# Patient Record
Sex: Male | Born: 1940 | Race: White | Hispanic: No | Smoking: Former smoker
Health system: Southern US, Community
[De-identification: ages and names within clinical notes are randomized; demographics above are authoritative.]

## PROBLEM LIST (undated history)

## (undated) DIAGNOSIS — R1314 Dysphagia, pharyngoesophageal phase: Principal | ICD-10-CM

## (undated) DIAGNOSIS — Z973 Presence of spectacles and contact lenses: Secondary | ICD-10-CM

## (undated) DIAGNOSIS — C61 Malignant neoplasm of prostate: Secondary | ICD-10-CM

## (undated) DIAGNOSIS — N433 Hydrocele, unspecified: Secondary | ICD-10-CM

## (undated) DIAGNOSIS — K219 Gastro-esophageal reflux disease without esophagitis: Secondary | ICD-10-CM

## (undated) DIAGNOSIS — E785 Hyperlipidemia, unspecified: Secondary | ICD-10-CM

## (undated) DIAGNOSIS — Z8546 Personal history of malignant neoplasm of prostate: Secondary | ICD-10-CM

## (undated) DIAGNOSIS — N529 Male erectile dysfunction, unspecified: Secondary | ICD-10-CM

## (undated) DIAGNOSIS — G20A1 Parkinson's disease without dyskinesia, without mention of fluctuations: Secondary | ICD-10-CM

## (undated) HISTORY — DX: Parkinson's disease without dyskinesia, without mention of fluctuations: G20.A1

## (undated) HISTORY — PX: ROBOT ASSISTED LAPAROSCOPIC RADICAL PROSTATECTOMY: SHX5141

## (undated) HISTORY — DX: Hyperlipidemia, unspecified: E78.5

## (undated) HISTORY — PX: TONSILLECTOMY: SUR1361

## (undated) HISTORY — DX: Malignant neoplasm of prostate: C61

## (undated) HISTORY — DX: Dysphagia, pharyngoesophageal phase: R13.14

## (undated) HISTORY — DX: Gastro-esophageal reflux disease without esophagitis: K21.9

---

## 1969-08-06 HISTORY — PX: PILONIDAL CYST EXCISION: SHX744

## 1992-12-06 HISTORY — PX: HYDROCELE EXCISION: SHX482

## 2004-11-05 ENCOUNTER — Encounter (INDEPENDENT_AMBULATORY_CARE_PROVIDER_SITE_OTHER): Payer: Self-pay | Admitting: Specialist

## 2004-11-05 ENCOUNTER — Ambulatory Visit (HOSPITAL_COMMUNITY): Admission: RE | Admit: 2004-11-05 | Discharge: 2004-11-05 | Payer: Self-pay | Admitting: Gastroenterology

## 2005-11-11 ENCOUNTER — Ambulatory Visit (HOSPITAL_BASED_OUTPATIENT_CLINIC_OR_DEPARTMENT_OTHER): Admission: RE | Admit: 2005-11-11 | Discharge: 2005-11-11 | Payer: Self-pay | Admitting: Orthopedic Surgery

## 2005-11-11 ENCOUNTER — Ambulatory Visit (HOSPITAL_COMMUNITY): Admission: RE | Admit: 2005-11-11 | Discharge: 2005-11-11 | Payer: Self-pay | Admitting: Orthopedic Surgery

## 2005-11-11 HISTORY — PX: TENOTOMY ACHILLES TENDON: SUR1337

## 2007-10-17 ENCOUNTER — Ambulatory Visit: Admission: RE | Admit: 2007-10-17 | Discharge: 2007-11-14 | Payer: Self-pay | Admitting: Radiation Oncology

## 2008-01-01 ENCOUNTER — Encounter (INDEPENDENT_AMBULATORY_CARE_PROVIDER_SITE_OTHER): Payer: Self-pay | Admitting: Urology

## 2008-01-01 ENCOUNTER — Inpatient Hospital Stay (HOSPITAL_COMMUNITY): Admission: RE | Admit: 2008-01-01 | Discharge: 2008-01-02 | Payer: Self-pay | Admitting: Urology

## 2009-03-10 IMAGING — CR DG CHEST 2V
2 series · 2 of 2 positions shown · non-contrast
Comparison: No prior studies are available for comparison.

CLINICAL DATA: Preoperative evaluation. Prostate cancer. Quit smoking 30 years ago.
 CHEST - 2 VIEW:

[view not recorded (1 of 2)]
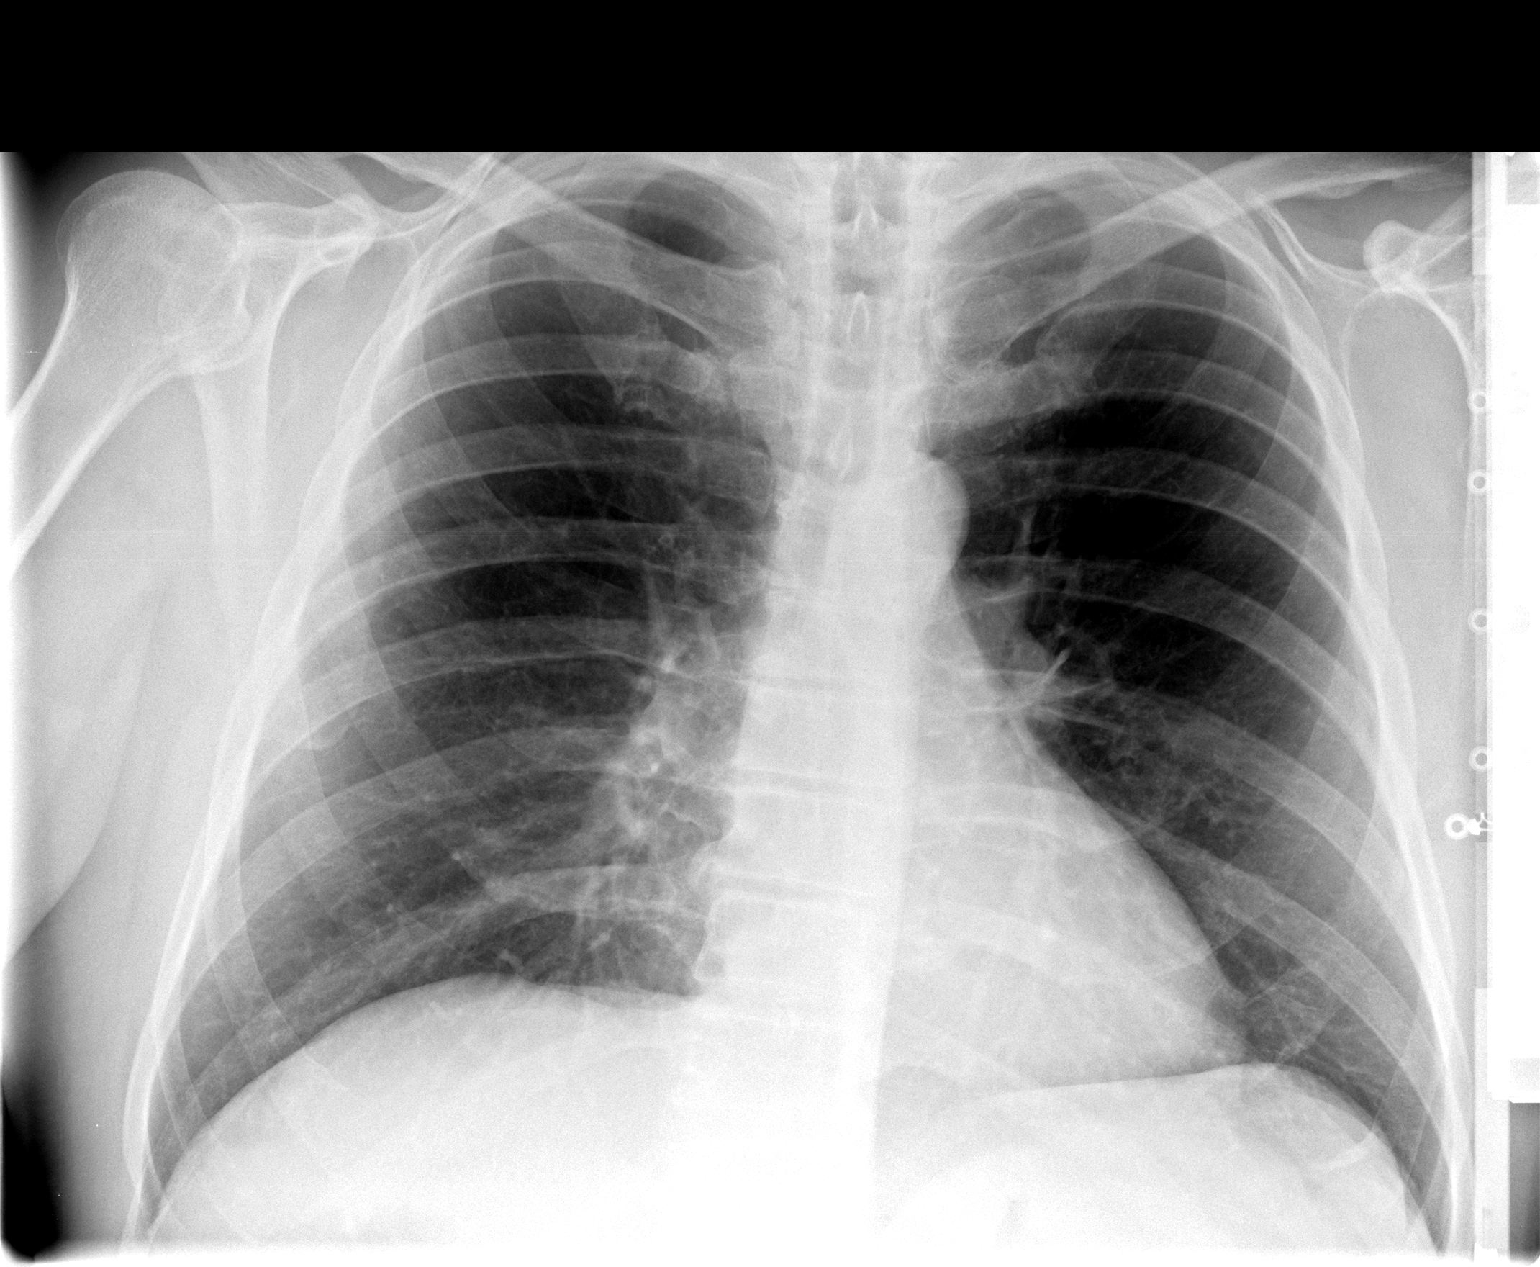

[view not recorded (2 of 2)]
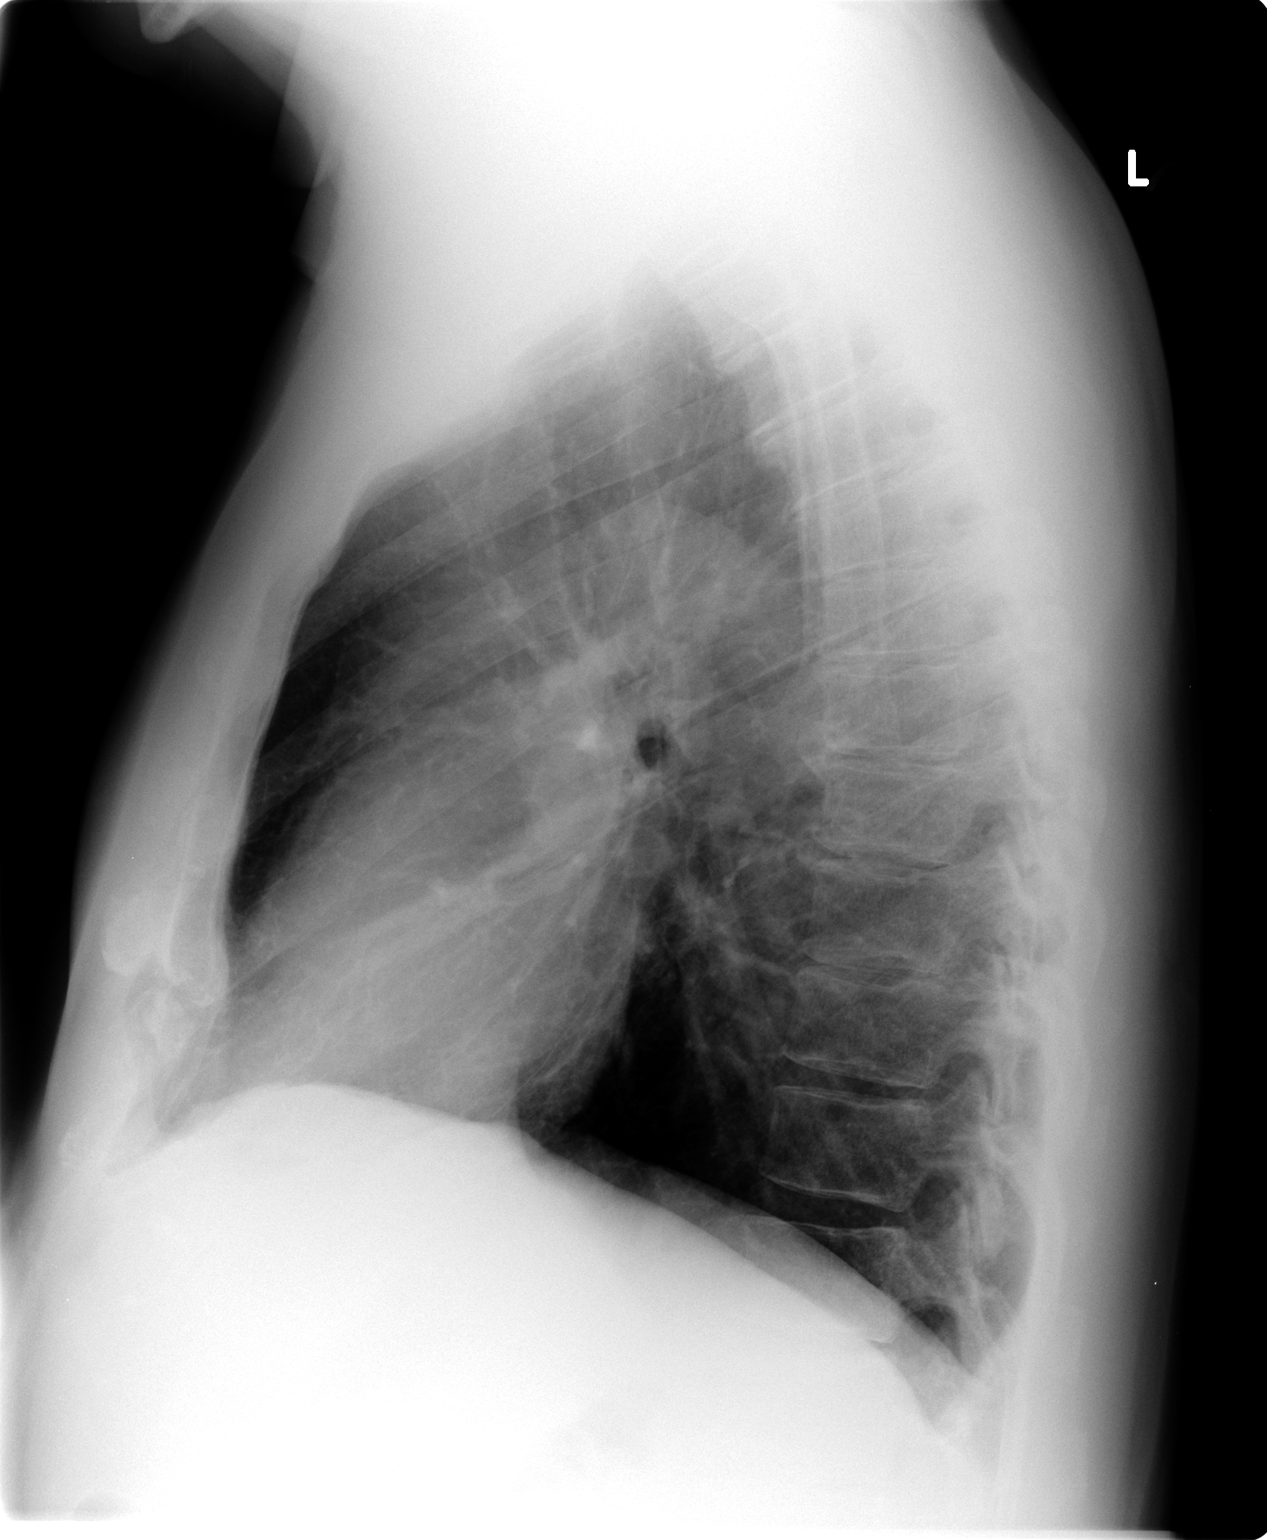

[2 of 2 positions shown; findings below may reference images not displayed]

FINDINGS: The heart and mediastinal contours are within normal limits. The lung fields appear clear with no evidence for focal infiltrate or congestive failure. The bony structures demonstrate some degenerative changes of the mid thoracic spine with anterior osteophytosis at several levels and are otherwise intact.
IMPRESSION: No acute cardiopulmonary disease.

## 2011-04-20 NOTE — Op Note (Signed)
NAME:  Johnny [name], Johnny [name] NO.:  [id number]   MEDICAL RECORD NO.:  [id number]          PATIENT TYPE:  INP   LOCATION:  0003                         FACILITY:  [facility]   PHYSICIAN:  Heloise Purpura, MD      DATE OF BIRTH:  [birth year]   DATE OF PROCEDURE:  01/01/2008  DATE OF DISCHARGE:                               OPERATIVE REPORT   PREOPERATIVE DIAGNOSIS:  Clinically localized adenocarcinoma of the  prostate.   POSTOPERATIVE DIAGNOSIS:  Clinically localized adenocarcinoma of the  prostate.   PROCEDURE:  Robotic assisted laparoscopic radical prostatectomy  (bilateral nerve sparing).   SURGEON:  Dr. Heloise Purpura.   ASSISTANT:  Dr. Georgeanna Lea.   ANESTHESIA:  General.   COMPLICATIONS:  None.   ESTIMATED BLOOD LOSS:  100 mL.   INTRAVENOUS FLUIDS:  1600 mL of lactated Ringer's.   SPECIMENS:  Prostate and seminal vesicles.   DISPOSITION OF SPECIMENS:  To pathology.   DRAINS:  1. 20-French coude' catheter.  2. #19 Blake pelvic drain.   INDICATIONS:  Mr. Meegan is a 70 year old gentleman with clinically  localized adenocarcinoma of prostate.  After a discussion regarding  management options for treatment, he elected to proceed with surgical  therapy and the above procedure.  Potential risks/benefits,  complications, and alternative options were discussed with the patient  in detail and informed consent was obtained.   DESCRIPTION OF PROCEDURE:  The patient was taken to the operating room  and a general anesthetic was administered.  He was given preoperative  antibiotics, placed in the dorsal lithotomy position, and prepped and  draped in the usual sterile fashion.  Next a preoperative time-out was  performed.  A Foley catheter was inserted into the bladder and a site  was selected just to the left of the umbilicus for placement of the  camera port.  This was placed using a standard open Hasson technique.  This allowed entry into the peritoneal cavity  and under direct vision.  A 12 mm port was then placed and a pneumoperitoneum was established.  The 0 degrees lens was used to inspect the abdomen and there was no  evidence of any intra-abdominal injuries or other abnormalities.  Attention then turned to placement of the remaining ports.  Bilateral 8  mm robotic ports were placed lateral to and just inferior to the camera  port site.  An additional 8 mm robotic port was placed in the far left  lateral abdominal wall.  A 5 mm port was placed between the camera port  and the right robotic port and an additional 12 mm port was placed in  the far right lateral abdominal wall for laparoscopic assistance.  All  ports were placed under direct vision and without difficulty.  The  surgical cart was then docked.  With the aid of the cautery scissors,  the bladder was reflected posteriorly allowing entry into the space of  Retzius and identification of the endopelvic fascia and prostate.  The  endopelvic fascia was incised from the apex back to the base of the  prostate bilaterally and the underlying levator muscle fibers  were swept  laterally off the prostate thereby isolating the dorsal venous complex.  The dorsal venous complex was then stapled and divided with a 45 mm Flex  ETS stapler.  The bladder neck was identified with the help of Foley  catheter manipulation and was divided anteriorly exposing the Foley  catheter balloon.  The patient was noted to have lateral lobe  hypertrophy of the prostate and necessitated a wide bladder neck  resection.  The Foley catheter balloon was deflated and the catheter was  brought into the operative field and used to retract the prostate  anteriorly.  This exposed the posterior bladder neck which was divided  and dissection continued between the prostate and bladder until the vasa  deferentia and seminal vesicles were identified.  The vasa deferentia  were isolated, divided and lifted anteriorly.  The seminal  vesicles were  dissected down to their tips with care to control the seminal vesicle  arterial blood supply with Hem-o-lok clips.  The seminal vesicles were  then lifted anteriorly in the space between Denonvilliers fascia and the  anterior rectum was bluntly developed thereby isolating the vascular  pedicles of the prostate.  The lateral prostatic fascia was incised  allowing the neurovascular bundles to be released laterally and  posteriorly.  The vascular pedicles of the prostate were then ligated  with Hem-o-lok clips above the level of the neurovascular bundles and  sharp cold scissor dissection was used to divide the vascular pedicles.  The neurovascular bundles were swept off the apex of the prostate and  urethra.  The ureter was then sharply divided allowing the prostate  specimen to be disarticulated.  The pelvis was copiously irrigated and  hemostasis was ensured.  There was no evidence of a rectal injury.  Attention then turned the bladder neck.  Due to the fact that a wide  bladder neck dissection was required, it was decided to reconstruct the  bladder neck.  Therefore figure-of-eight 3-0 Vicryl sutures were placed  laterally on either side of the bladder neck for reconstructive  purposes.  A 2-0 Vicryl suture was then used to reapproximate  Denonvilliers fascia, the posterior bladder, and the posterior urethra  to reapproximate these structures.  A double-armed 3-0 Monocryl suture  was used to perform a 360 degrees running tension-free anastomosis  between the bladder neck and urethra.  A new 20-French coude' catheter  was inserted into the bladder and irrigated.  There were no blood clots  within the bladder and anastomosis appeared to be watertight.  A #19  Blake drain was brought through the left robotic port and appropriately  positioned in the pelvis.  It was secured to the skin with a nylon  suture.  The surgical cart was then undocked.  The right lateral 12 mm  port  site was closed with a 0-0 Vicryl fascial suture placed with the  aid of the suture passer device.  All remaining ports were removed under  direct vision.  The prostate specimen was then removed intact within the  Endopouch retrieval bag.  This fascial opening was closed with a running  0-0 Vicryl suture.  All port sites were injected with quarter percent  Marcaine and reapproximated at the skin level with staples.  Sterile  dressings were applied.  The patient appeared to tolerate the procedure  well without complications.  He was able to be extubated and transferred  to the recovery unit in satisfactory condition.      Heloise Purpura, MD  Electronically Signed     LB/MEDQ  D:  01/01/2008  T:  01/01/2008  Job:  [id number]

## 2011-04-20 NOTE — H&P (Signed)
NAME:  DAMEION, BRILES NO.:  [id number]   MEDICAL RECORD NO.:  [id number]          PATIENT TYPE:  INP   LOCATION:  0003                         FACILITY:  [facility]   PHYSICIAN:  Heloise Purpura, MD      DATE OF BIRTH:  [birth year]   DATE OF ADMISSION:  01/01/2008  DATE OF DISCHARGE:                              HISTORY & PHYSICAL   CHIEF COMPLAINTS:  Prostate cancer.   HISTORY OF PRESENT ILLNESS:  Mr. Delbene is a 70 year old gentleman  with clinical stage T1C prostate cancer with a PSA of 4.58 and Gleason  score of 3+3=6.  After a discussion regarding management options for  clinically localized prostate cancer, he elected to proceed with  surgical therapy and a robotic prostatectomy.   PAST MEDICAL HISTORY:  1. Gastroesophageal reflux disease.  2. Dyslipidemia.   PAST SURGICAL HISTORY:  1. Excision of pilonidal cyst  2. Repair of ruptured Achilles tendon.  3. Hydrocele repair.   MEDICATIONS:  1. Claritin.  2. Lipitor.  3. Singulair.  4. Zantac.   ALLERGIES:  NO KNOWN DRUG ALLERGIES.   FAMILY HISTORY:  No history of prostate cancer.   SOCIAL HISTORY:  He drinks two glass of alcohol per day and did smoke  one pack of cigarettes for 15 years but quit over 25 years ago.  He is  currently retired and is married.   REVIEW OF SYSTEMS:  A complete review of systems was performed.  All  systems are reviewed and are negative.   PHYSICAL EXAMINATION:  CONSTITUTIONAL:  Well-nourished, well-developed  age-appropriate male in no acute distress.  CARDIOVASCULAR:  Regular rate and rhythm without obvious murmurs.  LUNGS:  Clear bilaterally.  ABDOMEN:  Soft and nontender without abdominal masses.  GU: No prostate nodularity or induration.  EXTREMITIES:  No edema.   IMPRESSION:  Clinically localized adenocarcinoma of the prostate.   PLAN:  Mr. Wiedeman will undergo a robotic assisted laparoscopic radical  prostatectomy and then be admitted to the hospital for  routine  postoperative care.      Heloise Purpura, MD  Electronically Signed     LB/MEDQ  D:  01/01/2008  T:  01/01/2008  Job:  [id number]

## 2011-04-20 NOTE — Discharge Summary (Signed)
NAME:  Johnny [name], Johnny [name]             ACCOUNT NO.:  [id number]   MEDICAL RECORD NO.:  [id number]          PATIENT TYPE:  INP   LOCATION:  1417                         [facility]ACILITY:  [facility]   PHYSICIAN:  Heloise Purpura, MD      DATE OF BIRTH:  December 02, 1941   DATE OF ADMISSION:  01/01/2008  DATE OF DISCHARGE:  01/02/2008                               DISCHARGE SUMMARY   ADMISSION DIAGNOSIS:  Prostate cancer.   DISCHARGE DIAGNOSIS:  Prostate cancer.   HISTORY AND PHYSICAL:  For full details please see admission history and  physical.  Briefly, [name] is a 70 year old gentleman with  clinically localized adenocarcinoma of the prostate.  After a discussion  regarding management options for treatment, he elected to proceed with  surgical therapy and a robotic prostatectomy.   HOSPITAL COURSE:  On January 01, 2008, the patient was taken to the  operating room and underwent a robotic assisted laparoscopic radical  prostatectomy.  He tolerated this procedure well without complications.  Postoperatively, he was able to be transferred to a regular hospital  room following recovery from anesthesia.  He was able to begin  ambulating the night of surgery and remained hemodynamically stable.  On  the morning of postoperative day #1, his hematocrit was rechecked and  remained stable at 33.2.  He maintained excellent urine output with  minimal output from his pelvic drain.  His pelvic drain was therefore  able to be removed.  He was begun on a clear liquid diet which he  tolerated without difficulty and he was subsequently transitioned to  oral pain medication.  By the afternoon of postoperative day #1, he was  tolerating a regular diet and was able to be discharged home in  excellent condition.   DISPOSITION:  Home.   DISCHARGE MEDICATIONS:  He was instructed to resume his regular home  medications excepting any aspirin, nonsteroidal anti-inflammatory drugs,  or herbal supplements.  He was given a  prescription to take Vicodin as  needed for pain and to begin Cipro 1 day prior to his return visit for  Foley catheter removal.   DISCHARGE INSTRUCTIONS:  He was instructed to be ambulatory but  specifically told to refrain from any heavy lifting, strenuous activity,  or driving.  He was given routine instruction on Foley catheter care.   FOLLOW UP:  [name] will follow-up in 1 week for removal of his  Foley catheter and to discuss his surgical pathology in detail.      Heloise Purpura, MD  Electronically Signed     LB/MEDQ  D:  01/02/2008  T:  01/03/2008  Job:  [id number]

## 2011-04-23 NOTE — Op Note (Signed)
NAME:  Johnny [name], Johnny [name]             ACCOUNT NO.:  [id number]   MEDICAL RECORD NO.:  [id number]          PATIENT TYPE:  AMB   LOCATION:  DSC                          FACILITY:  [facility]   PHYSICIAN:  Nadara Mustard, MD     DATE OF BIRTH:  1941/07/24   DATE OF PROCEDURE:  11/11/2005  DATE OF DISCHARGE:                                 OPERATIVE REPORT   PREOPERATIVE DIAGNOSIS:  Right Achilles tendon rupture.   POSTOPERATIVE DIAGNOSIS:  Right Achilles tendon rupture.   PROCEDURE:  Right Achilles tendon reconstruction.   SURGEON:  Nadara Mustard, M.D.   ANESTHESIA:  Popliteal block.   ESTIMATED BLOOD LOSS:  Minimal.   ANTIBIOTICS:  None.   DRAINS:  None.   COMPLICATIONS:  None.   TOURNIQUET TIME:  None.   DISPOSITION:  To PACU in stable condition.   INDICATION FOR PROCEDURE:  The patient is a 70 year old gentleman who this  past weekend injured his Achilles tendon.  He thought that this would  improve with time.  It did not.  He was seen by Dr. Prince Rome, who evaluated the  patient, diagnosed an Achilles tendon rupture, and now he is seen for  evaluation and consultation, and the patient elected to proceed with  Achilles tendon reconstruction.  The risks and benefits were discussed  including infection, neurovascular injury, nonhealing of the wound and need  for additional surgery.  The patient states he understands and wishes  proceed at this time.   DESCRIPTION OF PROCEDURE:  The patient was brought to OR room 5 after  undergoing a popliteal block.  After adequate levels of anesthesia obtained,  the patient was placed in the lateral position with the right side down and  the right lower extremity was prepped using DuraPrep, draped into a sterile  field.  An Collier Flowers was used to cover all exposed skin.  A posterior medial  incision was made.  This was carried down through the Achilles peritenon.  This was reflected.  The Achilles tendon rupture was identified.  Both the  proximal  and distal ends had retracted.  There was approximately a 4 cm gap.  The Achilles was freed from its adhesions and the edges reapproximated  without any tension on the Achilles.  Using a Krakow stitch, two sutures  were placed proximally and two sutures distally with a Krakow stitch with a  total of four sutures exiting the proximal stump and four sutures exiting  the distal stump.  These sutures were each tied with the foot in plantar  flexion.  The Achilles approximated nicely and with plantar flexion and  dorsiflexion of the foot.  The Achilles tendon moved without any  restrictions.  The wound was cleansed.  The peritenon was closed using a  running 3-0 Monocryl.  The skin incision was closed using a 3-0 Vicryl with  an Allgower suture technique with no sutures crossing the posterior skin.  This closed without any tension on the skin.  The foot was kept in plantar  flexion and the skin was covered with Adaptic orthopedic sponges, sterile  Webril and a Coban  dressing.  The patient was placed in a postoperative  fracture boot with a heel lift to keep the foot plantar flexed.  The patient  was then taken to the PACU in stable condition.  Discharge to home.  Prescription called in for Vicodin for pain.  Follow-up in office in two  weeks.  The patient was given instructions for keeping his foot elevated  above his heart and nonweightbearing.      Nadara Mustard, MD  Electronically Signed     MVD/MEDQ  D:  11/11/2005  T:  11/12/2005  Job:  [id number]

## 2011-04-23 NOTE — Op Note (Signed)
NAME:  DERRAL, COLUCCI             ACCOUNT NO.:  [id number]   MEDICAL RECORD NO.:  [id number]          PATIENT TYPE:  AMB   LOCATION:  ENDO                         FACILITY:  [facility]   PHYSICIAN:  Danise Edge, M.D.   DATE OF BIRTH:  1941/04/17   DATE OF PROCEDURE:  11/05/2004  DATE OF DISCHARGE:                                 OPERATIVE REPORT   PROCEDURE:  Screening colonoscopy with polypectomy.   PROCEDURE INDICATION:  Mr. Slayde Brault is a 70 year old male, born 10/15/1941.  Mr. Bensinger submitted stool Hemoccult cards to Dr. Woodroe Mode  office.  Two of three cards were positive for blood.   ENDOSCOPIST:  Danise Edge, M.D.   PREMEDICATION:  1.  Versed 6 mg.  2.  Demerol 70 mg.   DESCRIPTION OF PROCEDURE:  After obtaining informed consent, Mr. Beeck  was placed in the left lateral decubitus position.  I administered  intravenous Demerol and intravenous Versed to achieve conscious sedation for  the procedure.  The patient's blood pressure, oxygen saturation, and cardiac  rhythm were monitored throughout the procedure and documented in the medical  record.   Anal inspection was normal.  Digital rectal exam revealed a nonnodular  prostate.  The Olympus adjustable pediatric colonoscope was introduced into  the rectum and advanced to the cecum.  Colonic preparation for the exam  today was excellent.   RECTUM:  From the mid rectum, two 2 mm sessile polyps were removed with the  electrocautery snare.  SIGMOID COLON AND DESCENDING COLON:  Normal.  SPLENIC FLEXURE:  Normal.  TRANSVERSE COLON:  Normal.  HEPATIC FLEXURE:  A 3 mm sessile polyp was removed from the hepatic flexure  with th electrocautery snare.  ASCENDING COLON:  Normal.  CECUM AND ILEOCECAL VALVE:  Normal.   ASSESSMENT:  1.  Two small polyps were removed from the rectum.  2.  A small polyp was removed from the hepatic flexure.   RECOMMENDATIONS:  Repeat colonoscopy in approximately 3-5 years if  polyps  return neoplastic pathologically.      MJ/MEDQ  D:  11/05/2004  T:  11/05/2004  Job:  [id number]   cc:   Maryla Morrow. Modesto Charon, M.D.  [address]  [place]  [place] [address]  Fax: [phone]

## 2011-08-27 LAB — BASIC METABOLIC PANEL
BUN: 15
CO2: 29
Chloride: 108
Potassium: 4.8

## 2011-08-27 LAB — CBC
HCT: 38.9 — ABNORMAL LOW
Hemoglobin: 13.6
MCV: 95.4
RDW: 12.6
WBC: 3.5 — ABNORMAL LOW

## 2011-08-27 LAB — TYPE AND SCREEN: Antibody Screen: NEGATIVE

## 2011-08-27 LAB — HEMOGLOBIN AND HEMATOCRIT, BLOOD
HCT: 36.8 — ABNORMAL LOW
Hemoglobin: 11.6 — ABNORMAL LOW

## 2013-03-07 DIAGNOSIS — E559 Vitamin D deficiency, unspecified: Secondary | ICD-10-CM | POA: Insufficient documentation

## 2013-03-07 DIAGNOSIS — C61 Malignant neoplasm of prostate: Secondary | ICD-10-CM | POA: Insufficient documentation

## 2013-03-07 DIAGNOSIS — D126 Benign neoplasm of colon, unspecified: Secondary | ICD-10-CM

## 2013-03-07 HISTORY — DX: Benign neoplasm of colon, unspecified: D12.6

## 2013-03-07 HISTORY — DX: Vitamin D deficiency, unspecified: E55.9

## 2013-08-10 ENCOUNTER — Other Ambulatory Visit: Payer: Self-pay | Admitting: Urology

## 2013-09-10 ENCOUNTER — Encounter (INDEPENDENT_AMBULATORY_CARE_PROVIDER_SITE_OTHER): Payer: Self-pay | Admitting: Surgery

## 2013-09-10 ENCOUNTER — Ambulatory Visit (INDEPENDENT_AMBULATORY_CARE_PROVIDER_SITE_OTHER): Payer: Medicare Other | Admitting: Surgery

## 2013-09-10 VITALS — BP 100/60 | HR 68 | Temp 98.6°F | Resp 14 | Ht 70.0 in | Wt 168.0 lb

## 2013-09-10 DIAGNOSIS — N433 Hydrocele, unspecified: Secondary | ICD-10-CM | POA: Insufficient documentation

## 2013-09-10 DIAGNOSIS — Z860100 Personal history of colon polyps, unspecified: Secondary | ICD-10-CM

## 2013-09-10 DIAGNOSIS — C61 Malignant neoplasm of prostate: Secondary | ICD-10-CM

## 2013-09-10 HISTORY — DX: Personal history of colon polyps, unspecified: Z86.0100

## 2013-09-10 HISTORY — DX: Personal history of colonic polyps: Z86.010

## 2013-09-10 HISTORY — DX: Hydrocele, unspecified: N43.3

## 2013-09-10 NOTE — Progress Notes (Signed)
Subjective:     Patient ID: Johnny [name], male   DOB: 05/23/1941, 72 y.o.   MRN: [id number]  HPI  Johnny [name]  1941/06/16 [id number]  Patient Care Team: Tammy Eartha Inch, MD as PCP - General (Family Medicine) Kathi Ludwig, MD as Consulting Physician (Urology) Ardeth Sportsman, MD as Consulting Physician (General Surgery)  This patient is a 72 y.o.male who presents today for surgical evaluation at the request of Dr. Patsi Sears.   Reason for visit: Large right scrotal hydrocele.  Possible concurrent right inguinal hernia  Pleasant male.  Had robotic prostatectomy done five years ago.  Recovered from that well.  Has a history of a left hydrocele resected in the distant past.  Has gradually developed a hydrocele in the right scrotum.  It has gotten more large and bothersome.  He mentioned this to his Urologist.  Hydrocele resection was recommended.  Dr. Patsi Sears was concerned about the possibility of a concurrent hernia.  Surgical consultation requested.  Patient rather active.  Unfortunately, his wife passed away in the summer, having to be an intense caregiver for the past year.  No problems with incontinence to urine or stool.  No history of MRSA.  Has a bowel movement about every day.  No complications with prior surgeries that he can recall.  He can walk at least 20 minutes without difficulty.  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  Patient Active Problem List   Diagnosis Date Noted  . Hydrocele, right 09/10/2013  . Personal history of colonic polyps 09/10/2013  . Prostate cancer     Past Medical History  Diagnosis Date  . Prostate cancer 2009    prostatectomy 2009   . GERD (gastroesophageal reflux disease)   . Hyperlipidemia     Past Surgical History  Procedure Laterality Date  . Robot assisted laparoscopic radical prostatectomy  2009    Dr Laverle Patter  . Tenotomy achilles tendon  2006    History   Social History  . Marital Status: Married     Spouse Name: N/A    Number of Children: N/A  . Years of Education: N/A   Occupational History  . Not on file.   Social History Main Topics  . Smoking status: Former Smoker -- 0.25 packs/day    Quit date: 12/06/1978  . Smokeless tobacco: Never Used  . Alcohol Use: 1.2 oz/week    2 Glasses of wine per week     Comment: daily  . Drug Use: No  . Sexual Activity: Not on file   Other Topics Concern  . Not on file   Social History Narrative  . No narrative on file    Family History  Problem Relation Age of Onset  . Cancer Son     bladder    Current Outpatient Prescriptions  Medication Sig Dispense Refill  . aspirin 81 MG tablet Take 81 mg by mouth daily.      . cetirizine (ZYRTEC) 10 MG tablet Take 10 mg by mouth daily.      . fish oil-omega-3 fatty acids 1000 MG capsule Take 2 g by mouth daily.      Marland Kitchen atorvastatin (LIPITOR) 20 MG tablet       . DEXILANT 60 MG capsule        No current facility-administered medications for this visit.     Not on File  BP 100/60  Pulse 68  Temp(Src) 98.6 F (37 C) (Temporal)  Resp 14  Ht 5'  10" (1.778 m)  Wt 168 lb (76.204 kg)  BMI 24.11 kg/m2  No results found.   Review of Systems  Constitutional: Negative for fever, chills and diaphoresis.  HENT: Negative for nosebleeds, sore throat, facial swelling, mouth sores, trouble swallowing and ear discharge.   Eyes: Negative for photophobia, discharge and visual disturbance.  Respiratory: Negative for choking, chest tightness, shortness of breath and stridor.   Cardiovascular: Negative for chest pain and palpitations.  Gastrointestinal: Negative for nausea, vomiting, abdominal pain, diarrhea, constipation, blood in stool, abdominal distention, anal bleeding and rectal pain.  Endocrine: Negative for cold intolerance and heat intolerance.  Genitourinary: Positive for scrotal swelling. Negative for dysuria, urgency, discharge, penile swelling, difficulty urinating and testicular  pain.       No difficulty with urinary incontinence or retention  Musculoskeletal: Negative for myalgias, back pain, arthralgias and gait problem.  Skin: Negative for color change, pallor, rash and wound.  Allergic/Immunologic: Negative for environmental allergies and food allergies.  Neurological: Negative for dizziness, speech difficulty, weakness, numbness and headaches.  Hematological: Negative for adenopathy. Does not bruise/bleed easily.  Psychiatric/Behavioral: Negative for hallucinations, confusion and agitation.       Objective:   Physical Exam  Constitutional: He is oriented to person, place, and time. He appears well-developed and well-nourished. No distress.  HENT:  Head: Normocephalic.  Mouth/Throat: Oropharynx is clear and moist. No oropharyngeal exudate.  Eyes: Conjunctivae and EOM are normal. Pupils are equal, round, and reactive to light. No scleral icterus.  Neck: Normal range of motion. Neck supple. No tracheal deviation present.  Cardiovascular: Normal rate, regular rhythm, normal heart sounds and intact distal pulses.   Pulmonary/Chest: Effort normal and breath sounds normal. No respiratory distress.  Abdominal: Soft. He exhibits no distension. There is no tenderness. Hernia confirmed negative in the right inguinal area and confirmed negative in the left inguinal area.  Incisions clean with normal healing ridges.  No hernias  Genitourinary: Penis normal.     Musculoskeletal: Normal range of motion. He exhibits no tenderness.  Lymphadenopathy:    He has no cervical adenopathy.       Right: No inguinal adenopathy present.       Left: No inguinal adenopathy present.  Neurological: He is alert and oriented to person, place, and time. No cranial nerve deficit. He exhibits normal muscle tone. Coordination normal.  Skin: Skin is warm and dry. No rash noted. He is not diaphoretic. No erythema. No pallor.  Psychiatric: He has a normal mood and affect. His behavior is  normal. Judgment and thought content normal.       Assessment:     Very large right scrotal hydrocele with possible small right inguinal hernia as well     Plan:     I think it is reasonable to perform diagnostic laparoscopy at the time of hydrocelectomy per the patient's wishes to determine if a hernia truly exists..  He would like to avoid multiple operations possible in hopes that I can do the case with Dr. Patsi Sears.   That way, avoids an open inguinal exploration.  If he does have a hernia, I would recommend repair with mesh.  Could try preperitoneal approach despite prior prostatectomy.  If adhesions are too dense, convert to classic open Lichtenstein type repair:  The anatomy & physiology of the abdominal wall and pelvic floor was discussed.  The pathophysiology of hernias in the inguinal and pelvic region was discussed.  Natural history risks such as progressive enlargement, pain, incarceration & strangulation  was discussed.   Contributors to complications such as smoking, obesity, diabetes, prior surgery, etc were discussed.    I feel the risks of no intervention will lead to serious problems that outweigh the operative risks; therefore, I recommended surgery to reduce and repair the hernia.  I explained laparoscopic techniques with possible need for an open approach.  I noted usual use of mesh to patch and/or buttress hernia repair  Risks such as bleeding, infection, abscess, need for further treatment, heart attack, death, and other risks were discussed.  I noted a good likelihood this will help address the problem.   Goals of post-operative recovery were discussed as well.  Possibility that this will not correct all symptoms was explained.  I stressed the importance of low-impact activity, aggressive pain control, avoiding constipation, & not pushing through pain to minimize risk of post-operative chronic pain or injury. Possibility of reherniation was discussed.  We will work to  minimize complications.     An educational handout further explaining the pathology & treatment options was given as well.  Questions were answered.  The patient expresses understanding & wishes to proceed with surgery.

## 2013-09-10 NOTE — Patient Instructions (Addendum)
See the Handout(s) we gave you.  Consider surgery (Laparoscopic exploration of the abdomen to diagnose and possibly treat an inguinal hernia).  Please call our office at [phone] if you wish to schedule surgery or if you have further questions / concerns.   Hydrocele, Adult Fluid can collect around the testicles. This fluid forms in a sac. This condition is called a hydrocele. The collected fluid causes swelling of the scrotum. Usually, it affects just one testicle. Most of the time, the condition does not cause pain. Sometimes, the hydrocele goes away on its own. Other times, surgery is needed to get rid of the fluid. CAUSES A hydrocele does not develop often. Different things can cause a hydrocele in a man, including:  Injury to the scrotum.  Infection.  X-ray of the area around the scrotum.  A tumor or cancer of the testicle.  Twisting of a testicle.  Decreased blood flow to the scrotum. SYMPTOMS   Swelling without pain. The hydrocele feels like a water-filled balloon.  Swelling with pain. This can occur if the hydrocele was caused by infection or twisting.  Mild discomfort in the scrotum.  The hydrocele may feel heavy.  Swelling that gets smaller when you lie down. DIAGNOSIS  Your caregiver will do a physical exam to decide if you have a hydrocele. This may include:  Asking questions about your overall health, today and in the past. Your caregiver may ask about any injuries, X-rays, or infections.  Pushing on your abdomen or asking you to change positions to see if the size of the hydrocele changes.  Shining a light through the scrotum (transillumination) to see if the fluid inside the scrotum is clear.  Blood tests and urine tests to check for infection.  Imaging studies that take pictures of the scrotum and testicles. TREATMENT  Treatment depends in part on what caused the condition. Options include:  Watchful waiting. Your caregiver checks the hydrocele  every so often.  Different surgeries to drain the fluid.  A needle may be put into the scrotum to drain fluid (needle aspiration). Fluid often returns after this type of treatment.  A cut (incision) may be made in the scrotum to remove the fluid sac (hydrocelectomy).  An incision may be made in the groin to repair a hydrocele that has contact with abdominal fluids (communicating hydrocele).  Medicines to treat an infection (antibiotics). HOME CARE INSTRUCTIONS  What you need to do at home may depend on the cause of the hydrocele and type of treatment. In general:  Take all medicine as directed by your caregiver. Follow the directions carefully.  Ask your caregiver if there is anything you should not do while you recover (activities, lifting, work, sex).  If you had surgery to repair a communicating hydrocele, recovery time may vary. Ask you caregiver about your recovery time.  Avoid heavy lifting for 4 to 6 weeks.  If you had an incision on the scrotum or groin, wash it for 2 to 3 days after surgery. Do this as long as the skin is closed and there are no gaps in the wound. Wash gently, and avoid rubbing the incision.  Keep all follow-up appointments. SEEK MEDICAL CARE IF:   Your scrotum seems to be getting larger.  The area becomes more and more uncomfortable. SEEK IMMEDIATE MEDICAL CARE IF:  You have a fever. Document Released: 05/12/2010 Document Revised: 02/14/2012 Document Reviewed: 05/12/2010 [facility] Patient Information 2014 [place], [place].  Hernia A hernia occurs when an internal organ  pushes out through a weak spot in the abdominal wall. Hernias most commonly occur in the groin and around the navel. Hernias often can be pushed back into place (reduced). Most hernias tend to get worse over time. Some abdominal hernias can get stuck in the opening (irreducible or incarcerated hernia) and cannot be reduced. An irreducible abdominal hernia which is tightly squeezed into the  opening is at risk for impaired blood supply (strangulated hernia). A strangulated hernia is a medical emergency. Because of the risk for an irreducible or strangulated hernia, surgery may be recommended to repair a hernia. CAUSES   Heavy lifting.  Prolonged coughing.  Straining to have a bowel movement.  A cut (incision) made during an abdominal surgery. HOME CARE INSTRUCTIONS   Bed rest is not required. You may continue your normal activities.  Avoid lifting more than 10 pounds (4.5 kg) or straining.  Cough gently. If you are a smoker it is best to stop. Even the best hernia repair can break down with the continual strain of coughing. Even if you do not have your hernia repaired, a cough will continue to aggravate the problem.  Do not wear anything tight over your hernia. Do not try to keep it in with an outside bandage or truss. These can damage abdominal contents if they are trapped within the hernia sac.  Eat a normal diet.  Avoid constipation. Straining over long periods of time will increase hernia size and encourage breakdown of repairs. If you cannot do this with diet alone, stool softeners may be used. SEEK IMMEDIATE MEDICAL CARE IF:   You have a fever.  You develop increasing abdominal pain.  You feel nauseous or vomit.  Your hernia is stuck outside the abdomen, looks discolored, feels hard, or is tender.  You have any changes in your bowel habits or in the hernia that are unusual for you.  You have increased pain or swelling around the hernia.  You cannot push the hernia back in place by applying gentle pressure while lying down. MAKE SURE YOU:   Understand these instructions.  Will watch your condition.  Will get help right away if you are not doing well or get worse. Document Released: 11/22/2005 Document Revised: 02/14/2012 Document Reviewed: 07/11/2008 [facility] Patient Information 2014 [place], [place].  LAPAROSCOPIC SURGERY: POST OP  INSTRUCTIONS  1. DIET: Follow a light bland diet the first 24 hours after arrival home, such as soup, liquids, crackers, etc.  Be sure to include lots of fluids daily.  Avoid fast food or heavy meals as your are more likely to get nauseated.  Eat a low fat the next few days after surgery.   2. Take your usually prescribed home medications unless otherwise directed. 3. PAIN CONTROL: a. Pain is best controlled by a usual combination of three different methods TOGETHER: i. Ice/Heat ii. Over the counter pain medication iii. Prescription pain medication b. Most patients will experience some swelling and bruising around the incisions.  Ice packs or heating pads (30-60 minutes up to 6 times a day) will help. Use ice for the first few days to help decrease swelling and bruising, then switch to heat to help relax tight/sore spots and speed recovery.  Some people prefer to use ice alone, heat alone, alternating between ice & heat.  Experiment to what works for you.  Swelling and bruising can take several weeks to resolve.   c. It is helpful to take an over-the-counter pain medication regularly for the first few weeks.  Choose one of the following that works best for you: i. Naproxen (Aleve, etc)  Two 220mg  tabs twice a day ii. Ibuprofen (Advil, etc) Three 200mg  tabs four times a day (every meal & bedtime) iii. Acetaminophen (Tylenol, etc) 500-650mg  four times a day (every meal & bedtime) d. A  prescription for pain medication (such as oxycodone, hydrocodone, etc) should be given to you upon discharge.  Take your pain medication as prescribed.  i. If you are having problems/concerns with the prescription medicine (does not control pain, nausea, vomiting, rash, itching, etc), please call us [phone] to see if we need to switch you to a different pain medicine that will work better for you and/or control your side effect better. ii. If you need a refill on your pain medication, please contact your pharmacy.   They will contact our office to request authorization. Prescriptions will not be filled after 5 pm or on week-ends. 4. Avoid getting constipated.  Between the surgery and the pain medications, it is common to experience some constipation.  Increasing fluid intake and taking a fiber supplement (such as Metamucil, Citrucel, FiberCon, MiraLax, etc) 1-2 times a day regularly will usually help prevent this problem from occurring.  A mild laxative (prune juice, Milk of Magnesia, MiraLax, etc) should be taken according to package directions if there are no bowel movements after 48 hours.   5. Watch out for diarrhea.  If you have many loose bowel movements, simplify your diet to bland foods & liquids for a few days.  Stop any stool softeners and decrease your fiber supplement.  Switching to mild anti-diarrheal medications (Kayopectate, Pepto Bismol) can help.  If this worsens or does not improve, please call us. 6. Wash / shower every day.  You may shower over the dressings as they are waterproof.  Continue to shower over incision(s) after the dressing is off. 7. Remove your waterproof bandages 5 days after surgery.  You may leave the incision open to air.  You may replace a dressing/Band-Aid to cover the incision for comfort if you wish.  8. ACTIVITIES as tolerated:   a. You may resume regular (light) daily activities beginning the next day-such as daily self-care, walking, climbing stairs-gradually increasing activities as tolerated.  If you can walk 30 minutes without difficulty, it is safe to try more intense activity such as jogging, treadmill, bicycling, low-impact aerobics, swimming, etc. b. Save the most intensive and strenuous activity for last such as sit-ups, heavy lifting, contact sports, etc  Refrain from any heavy lifting or straining until you are off narcotics for pain control.   c. DO NOT PUSH THROUGH PAIN.  Let pain be your guide: If it hurts to do something, don't do it.  Pain is your body  warning you to avoid that activity for another week until the pain goes down. d. You may drive when you are no longer taking prescription pain medication, you can comfortably wear a seatbelt, and you can safely maneuver your car and apply brakes. e. Bonita Quin may have sexual intercourse when it is comfortable.  9. FOLLOW UP in our office a. Please call [facility] at [phone] to set up an appointment to see your surgeon in the office for a follow-up appointment approximately 2-3 weeks after your surgery. b. Make sure that you call for this appointment the day you arrive home to insure a convenient appointment time. 10. IF YOU HAVE DISABILITY OR FAMILY LEAVE FORMS, BRING THEM TO THE OFFICE FOR PROCESSING.  DO  NOT GIVE THEM TO YOUR DOCTOR.   WHEN TO CALL us [phone]: 1. Poor pain control 2. Reactions / problems with new medications (rash/itching, nausea, etc)  3. Fever over 101.5 F (38.5 C) 4. Inability to urinate 5. Nausea and/or vomiting 6. Worsening swelling or bruising 7. Continued bleeding from incision. 8. Increased pain, redness, or drainage from the incision   The clinic staff is available to answer your questions during regular business hours (8:30am-5pm).  Please don't hesitate to call and ask to speak to one of our nurses for clinical concerns.   If you have a medical emergency, go to the nearest emergency room or call 911.  A surgeon from [facility] is always on call at the [facility], [place] [address], [address], [place], [place]  [address] ? MAIN: [phone] ? TOLL FREE: [phone] ?  FAX [phone] [email or web address]

## 2013-10-08 ENCOUNTER — Encounter (HOSPITAL_BASED_OUTPATIENT_CLINIC_OR_DEPARTMENT_OTHER): Payer: Self-pay | Admitting: *Deleted

## 2013-10-08 NOTE — Progress Notes (Signed)
NPO AFTER MN. ARRIVE AT 0900. NEEDS HG. WILL TAKE DEXILANT, LIPITOR, AND ZYRTEC AM DOS W/ SIPS OF WATER. REVIEWED [facility] GUIDELINES . WILL DO HIBICLENS SHOWER HS AND AM DOS. PT'S WIFE PASSED OUT IN AUG 2014. PT HAS A FRIEND DROPPING HIM OFF AND ED LANIER , FRIEND, WILL BE PICKING UP DAY AFTER . 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

## 2013-10-15 ENCOUNTER — Encounter (HOSPITAL_BASED_OUTPATIENT_CLINIC_OR_DEPARTMENT_OTHER): Payer: Self-pay | Admitting: *Deleted

## 2013-10-15 ENCOUNTER — Ambulatory Visit (HOSPITAL_BASED_OUTPATIENT_CLINIC_OR_DEPARTMENT_OTHER): Payer: Medicare Other | Admitting: Anesthesiology

## 2013-10-15 ENCOUNTER — Encounter (HOSPITAL_BASED_OUTPATIENT_CLINIC_OR_DEPARTMENT_OTHER): Payer: Medicare Other | Admitting: Anesthesiology

## 2013-10-15 ENCOUNTER — Ambulatory Visit (HOSPITAL_BASED_OUTPATIENT_CLINIC_OR_DEPARTMENT_OTHER)
Admission: RE | Admit: 2013-10-15 | Discharge: 2013-10-16 | Disposition: A | Discharge status: Home / Self Care | Payer: Medicare Other | Source: Ambulatory Visit | Attending: Urology | Admitting: Urology

## 2013-10-15 ENCOUNTER — Encounter (HOSPITAL_BASED_OUTPATIENT_CLINIC_OR_DEPARTMENT_OTHER)
Admission: RE | Disposition: A | Discharge status: Home / Self Care | Payer: Self-pay | Source: Ambulatory Visit | Attending: Urology

## 2013-10-15 DIAGNOSIS — K4020 Bilateral inguinal hernia, without obstruction or gangrene, not specified as recurrent: Secondary | ICD-10-CM

## 2013-10-15 DIAGNOSIS — Z7982 Long term (current) use of aspirin: Secondary | ICD-10-CM | POA: Insufficient documentation

## 2013-10-15 DIAGNOSIS — N508 Other specified disorders of male genital organs: Secondary | ICD-10-CM | POA: Insufficient documentation

## 2013-10-15 DIAGNOSIS — Z8546 Personal history of malignant neoplasm of prostate: Secondary | ICD-10-CM | POA: Insufficient documentation

## 2013-10-15 DIAGNOSIS — N4340 Spermatocele of epididymis, unspecified: Secondary | ICD-10-CM | POA: Insufficient documentation

## 2013-10-15 DIAGNOSIS — Z79899 Other long term (current) drug therapy: Secondary | ICD-10-CM | POA: Insufficient documentation

## 2013-10-15 DIAGNOSIS — Z87891 Personal history of nicotine dependence: Secondary | ICD-10-CM | POA: Insufficient documentation

## 2013-10-15 DIAGNOSIS — E785 Hyperlipidemia, unspecified: Secondary | ICD-10-CM | POA: Insufficient documentation

## 2013-10-15 DIAGNOSIS — N529 Male erectile dysfunction, unspecified: Secondary | ICD-10-CM | POA: Insufficient documentation

## 2013-10-15 DIAGNOSIS — Z9079 Acquired absence of other genital organ(s): Secondary | ICD-10-CM | POA: Insufficient documentation

## 2013-10-15 DIAGNOSIS — K219 Gastro-esophageal reflux disease without esophagitis: Secondary | ICD-10-CM | POA: Insufficient documentation

## 2013-10-15 DIAGNOSIS — N433 Hydrocele, unspecified: Secondary | ICD-10-CM | POA: Insufficient documentation

## 2013-10-15 HISTORY — PX: HYDROCELE EXCISION: SHX482

## 2013-10-15 HISTORY — DX: Presence of spectacles and contact lenses: Z97.3

## 2013-10-15 HISTORY — PX: INGUINAL HERNIA REPAIR: SHX194

## 2013-10-15 HISTORY — DX: Male erectile dysfunction, unspecified: N52.9

## 2013-10-15 HISTORY — PX: INSERTION OF MESH: SHX5868

## 2013-10-15 HISTORY — DX: Personal history of malignant neoplasm of prostate: Z85.46

## 2013-10-15 SURGERY — HYDROCELECTOMY
Anesthesia: General | Site: Abdomen | Laterality: Right

## 2013-10-15 MED ORDER — FENTANYL CITRATE 0.05 MG/ML IJ SOLN
25.0000 ug | INTRAMUSCULAR | Status: DC | PRN
  Filled 2013-10-15: qty 1

## 2013-10-15 MED ORDER — ONDANSETRON HCL 4 MG/2ML IJ SOLN
4.0000 mg | Freq: Four times a day (QID) | INTRAMUSCULAR | Status: DC | PRN
  Filled 2013-10-15: qty 2

## 2013-10-15 MED ORDER — PROMETHAZINE HCL 25 MG/ML IJ SOLN
12.5000 mg | Freq: Four times a day (QID) | INTRAMUSCULAR | Status: DC | PRN
Start: 2013-10-15 — End: 2013-10-16
  Filled 2013-10-15: qty 1

## 2013-10-15 MED ORDER — LACTATED RINGERS IV SOLN
INTRAVENOUS | Status: DC
  Administered 2013-10-15: 10:00:00 via INTRAVENOUS
  Filled 2013-10-15: qty 1000

## 2013-10-15 MED ORDER — PROPOFOL 10 MG/ML IV BOLUS
INTRAVENOUS | Status: DC | PRN
  Administered 2013-10-15: 200 mg via INTRAVENOUS

## 2013-10-15 MED ORDER — DEXAMETHASONE SODIUM PHOSPHATE 4 MG/ML IJ SOLN
INTRAMUSCULAR | Status: DC | PRN
  Administered 2013-10-15: 8 mg via INTRAVENOUS

## 2013-10-15 MED ORDER — OXYCODONE HCL 5 MG PO TABS
5.0000 mg | ORAL_TABLET | ORAL | Status: DC | PRN
  Filled 2013-10-15: qty 2

## 2013-10-15 MED ORDER — KETOROLAC TROMETHAMINE 30 MG/ML IJ SOLN
INTRAMUSCULAR | Status: DC | PRN
  Administered 2013-10-15: 30 mg via INTRAVENOUS

## 2013-10-15 MED ORDER — HYDROMORPHONE HCL PF 1 MG/ML IJ SOLN
0.5000 mg | INTRAMUSCULAR | Status: DC | PRN
  Filled 2013-10-15: qty 1

## 2013-10-15 MED ORDER — PANTOPRAZOLE SODIUM 40 MG PO TBEC
40.0000 mg | DELAYED_RELEASE_TABLET | Freq: Every day | ORAL | Status: DC
  Filled 2013-10-15: qty 1

## 2013-10-15 MED ORDER — OXYCODONE HCL 5 MG PO TABS: 5.0000 mg | ORAL_TABLET | ORAL | Status: DC | PRN

## 2013-10-15 MED ORDER — LORATADINE 10 MG PO TABS
10.0000 mg | ORAL_TABLET | Freq: Every day | ORAL | Status: DC
  Filled 2013-10-15: qty 1

## 2013-10-15 MED ORDER — SODIUM CHLORIDE 0.9 % IJ SOLN
3.0000 mL | INTRAMUSCULAR | Status: DC | PRN
  Filled 2013-10-15: qty 3

## 2013-10-15 MED ORDER — FENTANYL CITRATE 0.05 MG/ML IJ SOLN
INTRAMUSCULAR | Status: DC | PRN
  Administered 2013-10-15 (×9): 25 ug via INTRAVENOUS
  Administered 2013-10-15: 50 ug via INTRAVENOUS
  Administered 2013-10-15: 25 ug via INTRAVENOUS

## 2013-10-15 MED ORDER — SODIUM CHLORIDE 0.45 % IV SOLN
INTRAVENOUS | Status: DC
  Administered 2013-10-16: 02:00:00 via INTRAVENOUS
  Filled 2013-10-15: qty 1000

## 2013-10-15 MED ORDER — LIP MEDEX EX OINT
1.0000 "application " | TOPICAL_OINTMENT | Freq: Two times a day (BID) | CUTANEOUS | Status: DC
  Filled 2013-10-15: qty 7

## 2013-10-15 MED ORDER — CEPHALEXIN 500 MG PO CAPS: 500.0000 mg | ORAL_CAPSULE | Freq: Two times a day (BID) | ORAL | Status: DC

## 2013-10-15 MED ORDER — ONDANSETRON HCL 4 MG/2ML IJ SOLN
INTRAMUSCULAR | Status: DC | PRN
  Administered 2013-10-15: 4 mg via INTRAVENOUS

## 2013-10-15 MED ORDER — DIPHENHYDRAMINE HCL 50 MG/ML IJ SOLN
12.5000 mg | Freq: Four times a day (QID) | INTRAMUSCULAR | Status: DC | PRN
  Filled 2013-10-15: qty 0.25

## 2013-10-15 MED ORDER — BISACODYL 10 MG RE SUPP
10.0000 mg | Freq: Every day | RECTAL | Status: DC | PRN
  Filled 2013-10-15: qty 1

## 2013-10-15 MED ORDER — MAGNESIUM HYDROXIDE 400 MG/5ML PO SUSP
30.0000 mL | Freq: Two times a day (BID) | ORAL | Status: DC | PRN
  Filled 2013-10-15: qty 30

## 2013-10-15 MED ORDER — CHLORHEXIDINE GLUCONATE 4 % EX LIQD
1.0000 "application " | Freq: Once | CUTANEOUS | Status: DC
  Filled 2013-10-15: qty 15

## 2013-10-15 MED ORDER — SUCCINYLCHOLINE CHLORIDE 20 MG/ML IJ SOLN
INTRAMUSCULAR | Status: DC | PRN
  Administered 2013-10-15: 120 mg via INTRAVENOUS

## 2013-10-15 MED ORDER — LACTATED RINGERS IV SOLN
INTRAVENOUS | Status: DC | PRN
  Administered 2013-10-15 (×2): via INTRAVENOUS

## 2013-10-15 MED ORDER — SODIUM CHLORIDE 0.9 % IJ SOLN
3.0000 mL | Freq: Two times a day (BID) | INTRAMUSCULAR | Status: DC
  Filled 2013-10-15: qty 3

## 2013-10-15 MED ORDER — CEFAZOLIN SODIUM-DEXTROSE 2-3 GM-% IV SOLR
2.0000 g | INTRAVENOUS | Status: DC
  Filled 2013-10-15: qty 50

## 2013-10-15 MED ORDER — MIDAZOLAM HCL 5 MG/5ML IJ SOLN
INTRAMUSCULAR | Status: DC | PRN
  Administered 2013-10-15 (×2): 1 mg via INTRAVENOUS

## 2013-10-15 MED ORDER — ATORVASTATIN CALCIUM 20 MG PO TABS
20.0000 mg | ORAL_TABLET | Freq: Every morning | ORAL | Status: DC
  Filled 2013-10-15: qty 1

## 2013-10-15 MED ORDER — ALUM & MAG HYDROXIDE-SIMETH 200-200-20 MG/5ML PO SUSP
30.0000 mL | Freq: Four times a day (QID) | ORAL | Status: DC | PRN
  Filled 2013-10-15: qty 30

## 2013-10-15 MED ORDER — ZOLPIDEM TARTRATE 5 MG PO TABS
5.0000 mg | ORAL_TABLET | Freq: Every evening | ORAL | Status: DC | PRN
  Filled 2013-10-15: qty 1

## 2013-10-15 MED ORDER — ASPIRIN 81 MG PO TABS: 81.0000 mg | ORAL_TABLET | Freq: Every day | ORAL | Status: DC

## 2013-10-15 MED ORDER — CEFAZOLIN SODIUM-DEXTROSE 2-3 GM-% IV SOLR
INTRAVENOUS | Status: DC | PRN
  Administered 2013-10-15: 2 g via INTRAVENOUS

## 2013-10-15 MED ORDER — ACETAMINOPHEN 10 MG/ML IV SOLN
INTRAVENOUS | Status: DC | PRN
  Administered 2013-10-15: 1000 mg via INTRAVENOUS

## 2013-10-15 MED ORDER — METOPROLOL TARTRATE 1 MG/ML IV SOLN
5.0000 mg | Freq: Four times a day (QID) | INTRAVENOUS | Status: DC | PRN
  Filled 2013-10-15: qty 5

## 2013-10-15 MED ORDER — LACTATED RINGERS IV BOLUS (SEPSIS)
1000.0000 mL | Freq: Three times a day (TID) | INTRAVENOUS | Status: DC | PRN
Start: 2013-10-15 — End: 2013-10-16
  Filled 2013-10-15: qty 1000

## 2013-10-15 MED ORDER — PROMETHAZINE HCL 25 MG/ML IJ SOLN
6.2500 mg | INTRAMUSCULAR | Status: DC | PRN
  Filled 2013-10-15: qty 1

## 2013-10-15 MED ORDER — BUPIVACAINE-EPINEPHRINE 0.25% -1:200000 IJ SOLN
INTRAMUSCULAR | Status: DC | PRN
  Administered 2013-10-15: 50 mL

## 2013-10-15 MED ORDER — ACETAMINOPHEN 500 MG PO TABS
1000.0000 mg | ORAL_TABLET | Freq: Three times a day (TID) | ORAL | Status: DC
  Administered 2013-10-15 (×2): 975 mg via ORAL
  Filled 2013-10-15: qty 2

## 2013-10-15 MED ORDER — MAGIC MOUTHWASH
15.0000 mL | Freq: Four times a day (QID) | ORAL | Status: DC | PRN
  Filled 2013-10-15: qty 15

## 2013-10-15 MED ORDER — FOLIC ACID 1 MG PO TABS
1.0000 mg | ORAL_TABLET | Freq: Every day | ORAL | Status: DC
  Filled 2013-10-15: qty 1

## 2013-10-15 MED ORDER — KETOROLAC TROMETHAMINE 30 MG/ML IJ SOLN
15.0000 mg | Freq: Once | INTRAMUSCULAR | Status: AC | PRN
  Filled 2013-10-15: qty 1

## 2013-10-15 MED ORDER — OXYCODONE-ACETAMINOPHEN 5-325 MG PO TABS: 1.0000 | ORAL_TABLET | ORAL | Status: DC | PRN

## 2013-10-15 MED ORDER — ROCURONIUM BROMIDE 100 MG/10ML IV SOLN
INTRAVENOUS | Status: DC | PRN
  Administered 2013-10-15 (×2): 10 mg via INTRAVENOUS
  Administered 2013-10-15: 30 mg via INTRAVENOUS

## 2013-10-15 MED ORDER — LIDOCAINE HCL (CARDIAC) 20 MG/ML IV SOLN
INTRAVENOUS | Status: DC | PRN
  Administered 2013-10-15: 60 mg via INTRAVENOUS

## 2013-10-15 MED ORDER — CEPHALEXIN 500 MG PO CAPS
500.0000 mg | ORAL_CAPSULE | Freq: Three times a day (TID) | ORAL | Status: DC
  Administered 2013-10-15 (×2): 500 mg via ORAL
  Filled 2013-10-15 (×2): qty 1

## 2013-10-15 MED ORDER — EPHEDRINE SULFATE 50 MG/ML IJ SOLN
INTRAMUSCULAR | Status: DC | PRN
  Administered 2013-10-15 (×2): 10 mg via INTRAVENOUS

## 2013-10-15 MED ORDER — GLYCOPYRROLATE 0.2 MG/ML IJ SOLN
INTRAMUSCULAR | Status: DC | PRN
  Administered 2013-10-15: 0.6 mg via INTRAVENOUS

## 2013-10-15 MED ORDER — NEOSTIGMINE METHYLSULFATE 1 MG/ML IJ SOLN
INTRAMUSCULAR | Status: DC | PRN
  Administered 2013-10-15: 4 mg via INTRAVENOUS

## 2013-10-15 MED ORDER — DIPHENHYDRAMINE HCL 12.5 MG/5ML PO ELIX
12.5000 mg | ORAL_SOLUTION | Freq: Four times a day (QID) | ORAL | Status: DC | PRN
  Filled 2013-10-15: qty 5

## 2013-10-15 MED ORDER — SODIUM CHLORIDE 0.9 % IV SOLN
250.0000 mL | INTRAVENOUS | Status: DC | PRN
  Filled 2013-10-15: qty 250

## 2013-10-15 MED ORDER — BUPIVACAINE HCL (PF) 0.25 % IJ SOLN
INTRAMUSCULAR | Status: DC | PRN
  Administered 2013-10-15: 10 mL

## 2013-10-15 MED ORDER — OXYCODONE-ACETAMINOPHEN 5-325 MG PO TABS
1.0000 | ORAL_TABLET | ORAL | Status: DC | PRN
  Filled 2013-10-15: qty 2

## 2013-10-15 SURGICAL SUPPLY — 69 items
BAG URINE LEG 500ML (DRAIN) ×3 IMPLANT
BANDAGE GAUZE ELAST BULKY 4 IN (GAUZE/BANDAGES/DRESSINGS) ×3 IMPLANT
BLADE SURG 15 STRL LF DISP TIS (BLADE) ×4 IMPLANT
BLADE SURG 15 STRL SS (BLADE) ×2
BLADE SURG ROTATE 9660 (MISCELLANEOUS) ×3 IMPLANT
BRIEF STRETCH FOR OB PAD LRG (UNDERPADS AND DIAPERS) ×3 IMPLANT
CANISTER SUCTION 2500CC (MISCELLANEOUS) ×9 IMPLANT
CATH FOLEY 2WAY SLVR  5CC 16FR (CATHETERS) ×1
CATH FOLEY 2WAY SLVR 5CC 16FR (CATHETERS) ×2 IMPLANT
CHLORAPREP W/TINT 26ML (MISCELLANEOUS) ×3 IMPLANT
CLOTH BEACON ORANGE TIMEOUT ST (SAFETY) ×3 IMPLANT
CONT SPEC 4OZ CLIKSEAL STRL BL (MISCELLANEOUS) ×3 IMPLANT
COVER MAYO STAND STRL (DRAPES) ×3 IMPLANT
COVER TABLE BACK 60X90 (DRAPES) ×3 IMPLANT
DECANTER SPIKE VIAL GLASS SM (MISCELLANEOUS) ×3 IMPLANT
DERMABOND ADVANCED (GAUZE/BANDAGES/DRESSINGS) ×1
DERMABOND ADVANCED .7 DNX12 (GAUZE/BANDAGES/DRESSINGS) ×2 IMPLANT
DEVICE SECURE STRAP 25 ABSORB (INSTRUMENTS) IMPLANT
DISSECTOR ROUND CHERRY 3/8 STR (MISCELLANEOUS) IMPLANT
DRAIN PENROSE 18X1/2 LTX STRL (DRAIN) ×3 IMPLANT
DRAIN PENROSE 18X1/4 LTX STRL (WOUND CARE) IMPLANT
DRAPE LAPAROSCOPIC ABDOMINAL (DRAPES) ×3 IMPLANT
DRAPE PED LAPAROTOMY (DRAPES) ×6 IMPLANT
DRAPE UTILITY XL STRL (DRAPES) ×3 IMPLANT
DRAPE WARM FLUID 44X44 (DRAPE) ×3 IMPLANT
DRSG TEGADERM 2-3/8X2-3/4 SM (GAUZE/BANDAGES/DRESSINGS) ×6 IMPLANT
DRSG TEGADERM 4X4.75 (GAUZE/BANDAGES/DRESSINGS) ×6 IMPLANT
ELECT NEEDLE TIP 2.8 STRL (NEEDLE) ×3 IMPLANT
ELECT REM PT RETURN 9FT ADLT (ELECTROSURGICAL) ×6
ELECTRODE REM PT RTRN 9FT ADLT (ELECTROSURGICAL) ×4 IMPLANT
GLOVE BIO SURGEON STRL SZ7 (GLOVE) ×6 IMPLANT
GLOVE BIO SURGEON STRL SZ7.5 (GLOVE) ×3 IMPLANT
GLOVE ECLIPSE 8.0 STRL XLNG CF (GLOVE) ×3 IMPLANT
GLOVE INDICATOR 7.0 STRL GRN (GLOVE) ×6 IMPLANT
GLOVE INDICATOR 7.5 STRL GRN (GLOVE) ×15 IMPLANT
GLOVE INDICATOR 8.0 STRL GRN (GLOVE) ×3 IMPLANT
GOWN PREVENTION PLUS LG XLONG (DISPOSABLE) ×18 IMPLANT
GOWN STRL REIN XL XLG (GOWN DISPOSABLE) ×6 IMPLANT
MESH ULTRAPRO 6X6 15CM15CM (Mesh General) ×6 IMPLANT
NEEDLE HYPO 25X1 1.5 SAFETY (NEEDLE) ×3 IMPLANT
NS IRRIG 500ML POUR BTL (IV SOLUTION) ×6 IMPLANT
PACK BASIN DAY SURGERY FS (CUSTOM PROCEDURE TRAY) ×6 IMPLANT
PAD PREP 24X48 CUFFED NSTRL (MISCELLANEOUS) ×3 IMPLANT
PENCIL BUTTON HOLSTER BLD 10FT (ELECTRODE) ×3 IMPLANT
SCISSORS LAP 5X35 DISP (ENDOMECHANICALS) ×3 IMPLANT
SET IRRIG TUBING LAPAROSCOPIC (IRRIGATION / IRRIGATOR) ×3 IMPLANT
SLEEVE XCEL OPT CAN 5 100 (ENDOMECHANICALS) ×3 IMPLANT
SPONGE GAUZE 2X2 8PLY STRL LF (GAUZE/BANDAGES/DRESSINGS) ×3 IMPLANT
SUT MNCRL AB 4-0 PS2 18 (SUTURE) ×3 IMPLANT
SUT VIC AB 3-0 SH 27 (SUTURE) ×5
SUT VIC AB 3-0 SH 27X BRD (SUTURE) ×10 IMPLANT
SUT VIC AB 3-0 X1 27 (SUTURE) IMPLANT
SUT VICRYL 0 UR6 27IN ABS (SUTURE) ×3 IMPLANT
SUT VICRYL 4-0 PS2 18IN ABS (SUTURE) ×3 IMPLANT
SYR 50ML LL SCALE MARK (SYRINGE) IMPLANT
SYR CONTROL 10ML LL (SYRINGE) ×3 IMPLANT
SYRINGE 10CC LL (SYRINGE) ×3 IMPLANT
TACKER 5MM HERNIA 3.5CML NAB (ENDOMECHANICALS) IMPLANT
TOWEL OR 17X24 6PK STRL BLUE (TOWEL DISPOSABLE) ×3 IMPLANT
TOWEL OR NON WOVEN STRL DISP B (DISPOSABLE) ×3 IMPLANT
TRAY DSU PREP LF (CUSTOM PROCEDURE TRAY) ×3 IMPLANT
TRAY LAP CHOLE (CUSTOM PROCEDURE TRAY) ×3 IMPLANT
TROCAR BLADELESS OPT 5 100 (ENDOMECHANICALS) ×3 IMPLANT
TROCAR XCEL BLUNT TIP 100MML (ENDOMECHANICALS) ×3 IMPLANT
TROCAR XCEL NON-BLD 5MMX100MML (ENDOMECHANICALS) ×3 IMPLANT
TUBE CONNECTING 12X1/4 (SUCTIONS) ×3 IMPLANT
TUBING INSUFFLATION 10FT LAP (TUBING) ×3 IMPLANT
WATER STERILE IRR 500ML POUR (IV SOLUTION) ×6 IMPLANT
YANKAUER SUCT BULB TIP NO VENT (SUCTIONS) ×3 IMPLANT

## 2013-10-15 NOTE — Anesthesia Postprocedure Evaluation (Signed)
  Anesthesia Post-op Note  Patient: Johnny [name]  Procedure(s) Performed: Procedure(s) (LRB): HYDROCELECTOMY ADULT (Right)  LAPAROSCOPIC EXPLORATION  with REPAIR OF BILATERAL INGUINAL HERNIA (Bilateral) INSERTION OF MESH (Bilateral)  Patient Location: PACU  Anesthesia Type: General  Level of Consciousness: awake and alert   Airway and Oxygen Therapy: Patient Spontanous Breathing  Post-op Pain: mild  Post-op Assessment: Post-op Vital signs reviewed, Patient's Cardiovascular Status Stable, Respiratory Function Stable, Patent Airway and No signs of Nausea or vomiting  Last Vitals:  Filed Vitals:   10/15/13 1400  BP: 150/70  Pulse: 83  Temp: 36.1 C  Resp: 16    Post-op Vital Signs: stable   Complications: No apparent anesthesia complications

## 2013-10-15 NOTE — H&P (Signed)
Hydrocele   Active Problems Problems  1. 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 Prostate Cancer 185  History of Present Illness         Johnny [name] is a 72 year old returns today for further evaluation of a hydrocele.  Hx of prostate cancer s/p a bilateral nerve-sparing RLRP on January 01, 2008. His PSA on 12/11/12 was < 0.01, undetectable since treatment.   He has noted a R inguinal/scrotal mass, enlarging over the last 2 years. No restrictions.  Note: wife dies 2ndary vascuar dementia.  TNM Stage: pT2c Nx Mx Gleason Score: 3+4=7 Surgical margins: Negative Pretreatment PSA: 4.58 Pretreatment SHIM: 21  Now 5 years out from his radical prostatectomy. He continues to maintain excellent continence and denies any new voiding complaints. He is continued to find Levitra 20 mg very satisfactory for treatment of his erectile dysfunction.   Past Medical History Problems  1. History of  Esophageal Reflux 530.81 2. History of  Hypercholesterolemia 272.0 3. Prostate Cancer 185  Surgical History Problems  1. History of  Pilonidal Cyst Resection 2. History of  Primary Repair Of Ruptured Achilles Tendon 3. History of  Prostatect Retropubic Radical W/ Nerve Sparing Laparoscopic 4. History of  Surgery Spermatic Cord Excision Of Hydrocele  Current Meds 1. Adult Aspirin Low Strength 81 MG Oral Tablet Dispersible; Therapy:  (Recorded:25Aug2010) to 2. Atorvastatin Calcium 20 MG Oral Tablet; Therapy: (Recorded:20Aug2014) to 3. BL Garlic TABS; Therapy: (Recorded:25Aug2010) to 4. Calcium + D TABS; Therapy: (Recorded:25Aug2010) to 5. Daily Multiple Vitamins TABS; Therapy: (Recorded:25Aug2010) to 6. Fish Oil CAPS; Therapy: (Recorded:25Aug2010) to 7. Fluticasone Propionate 50 MCG/ACT Nasal Suspension; Therapy: 28Mar2012 to 8. Folic Acid CAPS; Therapy: (Recorded:25Aug2010) to 9. Ketoconazole 2 % External Cream; Therapy: 19May2014 to 10. Levitra 20 MG Oral Tablet; Take as directed;  Therapy: 11Nov2011 to (Last Rx:11Jan2013)    Requested for: 11Jan2013 11. Prevacid PACK; Therapy: (Recorded:20Aug2014) to 12. Viagra 100 MG Oral Tablet; TAKE 1 TABLET As Directed; Therapy: 09Jan2014 to (Last   Rx:09Jan2014)  Requested for: 09Jan2014 13. ZyrTEC Allergy TABS; Therapy: (Recorded:12Aug2009) to  Allergies Medication  1. No Known Drug Allergies  Family History Problems  1. Maternal history of  Congestive Heart Failure 2. Paternal history of  Pulmonary Embolism Denied  3. Family history of  Prostate Cancer  Social History Problems  1. Alcohol Use 2 drinks/day 2. Former Smoker 1 ppd x 15 yrs, quit 28 yrs ago 3. Marital History - Currently Married 4. Occupation: retired  Review of Systems Genitourinary, constitutional, skin, eye, otolaryngeal, hematologic/lymphatic, cardiovascular, pulmonary, endocrine, musculoskeletal, gastrointestinal, neurological and psychiatric system(s) were reviewed and pertinent findings if present are noted.  Genitourinary: erectile dysfunction and scrotal swelling.    Vitals Vital Signs [Data Includes: Last 1 Day]  20Aug2014 01:54PM  Blood Pressure: 130 / 72 Temperature: 98.2 F Heart Rate: 72  Results/Data  Urine [Data Includes: Last 1 Day]   20Aug2014  COLOR AMBER   APPEARANCE CLEAR   SPECIFIC GRAVITY 1.030   pH 5.5   GLUCOSE NEG mg/dL  BILIRUBIN NEG   KETONE NEG mg/dL  BLOOD NEG   PROTEIN NEG mg/dL  UROBILINOGEN 0.2 mg/dL  NITRITE NEG   LEUKOCYTE ESTERASE NEG    25 Jul 2013 1:43 PM   UA With REFLEX       COLOR AMBER       APPEARANCE CLEAR       SPECIFIC GRAVITY 1.030       pH 5.5  GLUCOSE NEG       BILIRUBIN NEG       KETONE NEG       BLOOD NEG       PROTEIN NEG       UROBILINOGEN 0.2       NITRITE NEG       LEUKOCYTE ESTERASE NEG    Procedure  Scrotal u/s today:  Rt testicle:  4.22cm x 3.49cm x 4.17cm.  Rt epididymitis was not seen.  Rt hydrocele estimated 9.07 x 6.91cm with septations.  No varicocele.   Normal blood flow.   Lt testicle:  4.52cm x 2.57cm x 4.15cm.  Lt epididymitis head 0.72 x 0.49cm.  Body not seen.  Hydrocele present on the Lt, but smaller.  No varicocele.  Normal blood flow.     Assessment Assessed  1. Hydrocele Right 603.9 2. Prostate Cancer 185   Very large R hydrocele. No hernia seen on u/s but I suspect he has a hernia also. He is physically active, and is offerred Right hydrocelectomy, with possible RIH repair-if found coincidentally.   Plan  Hydrocele Right (603.9)  1. SCROTAL U/S  Done: 20Aug2014 12:00AM Prostate Cancer (185)  2. Follow-up Year x 1 Office  Follow-up  Requested for: 20Aug2014       RTC for R hydrocelectomy.    UA With REFLEX  Status: Resulted - Requires Verification  Done: 01Jan0001 12:00AM Ordered Today; For: Health Maintenance (V70.0); Ordered By: Jethro Bolus  Due: 22Aug2014 Marked Important; Last Updated By: Nathaniel Man   Signatures Electronically signed by : Jethro Bolus, M.D.; Jul 25 2013  4:05PM

## 2013-10-15 NOTE — Op Note (Signed)
10/15/2013  1:54 PM  PATIENT:  Johnny [name]  72 y.o. male  Patient Care Team: Tammy Eartha Inch, MD as PCP - General (Family Medicine) Kathi Ludwig, MD as Consulting Physician (Urology) Ardeth Sportsman, MD as Consulting Physician (General Surgery)  PRE-OPERATIVE DIAGNOSIS:  Large Right Hydrocele ?RIH  POST-OPERATIVE DIAGNOSIS:   Large Right Hydrocele Bilateral Inguinal herniae   PROCEDURE:  Procedure(s):  Laparoscopic lysis of adhesions x30 minutes (1/3 of the case)  LAPAROSCOPIC EXPLORATION  with REPAIR OF BILATERAL INGUINAL HERNIAe INSERTION OF MESH  SURGEON:  Surgeon(s):  Ardeth Sportsman, MD  ASSISTANT: RN   ANESTHESIA:   local and general  EBL:  Total I/O In: 1000 [I.V.:1000] Out: 150 [Urine:150]  Delay start of Pharmacological VTE agent (>24hrs) due to surgical blood loss or risk of bleeding:  no  DRAINS: Penrose drain in the Right scrotum (per urology)   SPECIMEN:  Source of Specimen:  spermatic cord lipomas & iliac LN  DISPOSITION OF SPECIMEN:  PATHOLOGY  COUNTS:  YES  PLAN OF CARE: Admit for overnight observation  PATIENT DISPOSITION:  PACU - hemodynamically stable.  INDICATION: pleasant active male who is alert hydrocele the right groin.  Suspicious for right inguinal hernia as well.  Surgical consultation requested.  I recommended diagnostic laparoscopy with possible repair:  The anatomy & physiology of the abdominal wall and pelvic floor was discussed.  The pathophysiology of hernias in the inguinal and pelvic region was discussed.  Natural history risks such as progressive enlargement, pain, incarceration & strangulation was discussed.   Contributors to complications such as smoking, obesity, diabetes, prior surgery, etc were discussed.    I feel the risks of no intervention will lead to serious problems that outweigh the operative risks; therefore, I recommended surgery to reduce and repair the hernia.  I explained laparoscopic  techniques with possible need for an open approach.  I noted usual use of mesh to patch and/or buttress hernia repair  Risks such as bleeding, infection, abscess, need for further treatment, heart attack, death, and other risks were discussed.  I noted a good likelihood this will help address the problem.   Goals of post-operative recovery were discussed as well.  Possibility that this will not correct all symptoms was explained.  I stressed the importance of low-impact activity, aggressive pain control, avoiding constipation, & not pushing through pain to minimize risk of post-operative chronic pain or injury. Possibility of reherniation was discussed.  We will work to minimize complications.     An educational handout further explaining the pathology & treatment options was given as well.  Questions were answered.  The patient expresses understanding & wishes to proceed with surgery.  OR FINDINGS: the patient had a small right direct/inguinal hernia pantaloon type.  He had a small left indirect inguinal hernia on the left side.  Some laxity in the right obturator foramen but not a true hernia.  No left obturator hernia.  Laxities in the femoral canal but no true hernias there.  DESCRIPTION:   The patient was identified & brought into the operating room. The patient was positioned supine with arms tucked. SCDs were active during the entire case. The patient underwent general anesthesia without any difficulty.  The abdomen was prepped and draped in a sterile fashion. The patient's bladder was emptied.  A Surgical Timeout confirmed our plan.  I made a transverse incision through the inferior umbilical fold.  I made a small transverse nick through the anterior rectus fascia contralateral  to the inguinal hernia side and placed a 0-vicryl stitch through the fascia.  I placed a 10 mm port into the peritoneal cavity.  I did diagnostic laparoscopy.  There is a large swath of greater omentum adherent down in the  lower abdomen.  I could not see the pelvis well.  However, I suspected a direct space hernia on the right side at the very least & possible indirect herniaon the left side.  I converted to a preperitoneal approach (TEP)  I placed a Hasson trocar into the preperitoneal plane.  Entry was clean.  We induced carbon dioxide insufflation. Camera inspection revealed no injury.  I used a 10mm angled scope to bluntly free the peritoneum off the infraumbilical anterior abdominal wall.  I created enough of a preperitoneal pocket to place 5mm ports into the right & left mid-abdomen into this preperitoneal cavity.  I focused attention on the right side since that was the dominant hernia side.   I used blunt & focused sharp dissection to free the peritoneum off the flank and down to the pubic rim.  Do some sharp dissection given his history of prior robotic prostatectomy.  I freed the anteriolateral bladder wall off the anteriolateral pelvic wall, sparing midline attachments.   I located a swath of peritoneum going into a hernia fascial defect at the direct space & internal ring consistent with a pantaloon ind/dir hernia.  I gradually freed the peritoneal hernia sac off safely and reduced it into the preperitoneal space.  I freed the peritoneum off the spermatic vessels & vas deferens.  I freed peritoneum off the retroperitoneum along the psoas muscle.    I checked & assured hemostasis.  The patient had a moderate size volume of spermatic cord lipomas.  I reduces down and morcellate and remove them.  Patient also had a lead point in the iliac lymph nodes as well.  I dissected those off and remove them as well.    I turned attention on the opposite side.  I did dissection in a similar, mirror-image fashion. The patient had an indirect hernia through the internal ring.  He had a more prominent spermatic cord on the left side.  I removed the spermatic cord and couple iliac lymph node adherent to it as well.  All was sent for  pathology given his history of prostate cancer.    In freeing off the hernia sacs I did have a breech in the peritoneum at the right direct space base near the vas deferens.  I closed that using absorbable Vicryl stitch using laparoscopic intracorporeal suturing.   I chose 15x15 cm sheets of ultra-lightweight polypropylene mesh (Ultrapro), one for each side.  I cut a single sigmoid-shaped slit ~6cm from a corner of each mesh.  I placed the meshes into the preperitoneal space & laid them as overlapping diamonds such that at the inferior points, a 6x6 cm corner flap rested in the true anterolateral pelvis, covering the obturator & femoral foramina.   I allowed the bladder to fall back and help tuck the corners of the mesh in.  The medial corners overlapped each other across midline cephalad to the pubic rim.   This provided >2 inch coverage around the hernia.  Because the defects well covered and not particularly large, I did not need tacks to hold the mesh in place.  I held the hernia sacs cephalad & evacuated carbon dioxide.  I closed the fascia with 0 vicryl absorbable suture.  I closed the skin using  4-0 monocryl stitch.  Sterile dressings were applied. The patient was extubated & arrived in the PACU in stable condition..  There is no one here to discuss postoperative results, instructions are written in the chart.

## 2013-10-15 NOTE — H&P (Signed)
Johnny [name]  15-Jul-1941 [id number]  CARE TEAM:  PCP: Verlon Au, MD  Outpatient Care Team: Patient Care Team: Tammy Eartha Inch, MD as PCP - General (Family Medicine) Kathi Ludwig, MD as Consulting Physician (Urology) Ardeth Sportsman, MD as Consulting Physician (General Surgery)  Inpatient Treatment Team: Treatment Team: Attending Provider: Kathi Ludwig, MD  This patient is a 72 y.o.male who presents today for surgical evaluation at the request of Dr. Patsi Sears.  Reason for visit: Large right scrotal hydrocele. Possible concurrent right inguinal hernia  Pleasant male. Had robotic prostatectomy done five years ago. Recovered from that well. Has a history of a left hydrocele resected in the distant past. Has gradually developed a hydrocele in the right scrotum. It has gotten more large and bothersome. He mentioned this to his Urologist. Hydrocele resection was recommended. Dr. Patsi Sears was concerned about the possibility of a concurrent hernia. Surgical consultation requested.  Patient rather active. Unfortunately, his wife passed away in the summer, having to be an intense caregiver for the past year. No problems with incontinence to urine or stool. No history of MRSA. Has a bowel movement about every day. No complications with prior surgeries that he can recall. He can walk at least 20 minutes without difficulty. 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 GERD (gastroesophageal reflux disease)   . Hyperlipidemia   . History of prostate cancer     S/P PROSTATECTOMY 2009  . Hydrocele, right   . Organic impotence   . Wears glasses     Past Surgical History  Procedure Laterality Date  . Robot assisted laparoscopic radical prostatectomy  01-01-2008  DR BORDEN  . Tenotomy achilles tendon Right 11-11-2005  . Pilonidal cyst excision  1970'S  . Hydrocele excision Left 1994    History    Social History  . Marital Status: Married    Spouse Name: N/A    Number of Children: N/A  . Years of Education: N/A   Occupational History  . Not on file.   Social History Main Topics  . Smoking status: Former Smoker -- 0.25 packs/day for 15 years    Types: Cigarettes    Quit date: 12/06/1978  . Smokeless tobacco: Never Used  . Alcohol Use: 1.2 oz/week    2 Glasses of wine per week     Comment: daily  . Drug Use: No  . Sexual Activity: Not on file   Other Topics Concern  . Not on file   Social History Narrative  . No narrative on file    Family History  Problem Relation Age of Onset  . Cancer Son     bladder    Current Facility-Administered Medications  Medication Dose Route Frequency Provider Last Rate Last Dose  . ceFAZolin (ANCEF) IVPB 2 g/50 mL premix  2 g Intravenous 30 min Pre-Op Kathi Ludwig, MD      . ceFAZolin (ANCEF) IVPB 2 g/50 mL premix  2 g Intravenous 60 min Pre-Op Ardeth Sportsman, MD      . chlorhexidine (HIBICLENS) 4 % liquid 1 application  1 application Topical Once Ardeth Sportsman, MD      . Melene Muller ON 10/16/2013] chlorhexidine (HIBICLENS) 4 % liquid 1 application  1 application Topical Once Ardeth Sportsman, MD      . lactated ringers infusion   Intravenous Continuous Eilene Ghazi, MD 50 mL/hr at 10/15/13 [phone]  Facility-Administered Medications Ordered in Other Encounters  Medication Dose Route Frequency Provider Last Rate Last Dose  . lactated ringers infusion    Continuous PRN Jessica Priest, CRNA         No Known Allergies  ROS: Constitutional:  No fevers, chills, sweats.  Weight stable Eyes:  No vision changes, No discharge HENT:  No sore throats, nasal drainage Lymph: No neck swelling, No bruising easily Pulmonary:  No cough, productive sputum CV: No orthopnea, PND  Patient walks 30 minutes for about 1 miles without difficulty.  No exertional chest/neck/shoulder/arm pain. GI: No personal nor family history of GI/colon cancer,  inflammatory bowel disease, irritable bowel syndrome, allergy such as Celiac Sprue, dietary/dairy problems, colitis, ulcers nor gastritis.  No recent sick contacts/gastroenteritis.  No travel outside the country.  No changes in diet. Renal: No UTIs, No hematuria Genital:  No drainage, bleeding, masses Musculoskeletal: No severe joint pain.  Good ROM major joints Skin:  No sores or lesions.  No rashes Heme/Lymph:  No easy bleeding.  No swollen lymph nodes Neuro: No focal weakness/numbness.  No seizures Psych: No suicidal ideation.  No hallucinations  BP 126/71  Pulse 62  Temp(Src) 97.7 F (36.5 C) (Oral)  Resp 16  Ht 6\' 5"  (1.956 m)  Wt 162 lb 8 oz (73.71 kg)  BMI 19.27 kg/m2  SpO2 100%  Physical Exam: General: Pt awake/alert/oriented x4 in no major acute distress Eyes: PERRL, normal EOM. Sclera nonicteric Neuro: CN II-XII intact w/o focal sensory/motor deficits. Lymph: No head/neck/groin lymphadenopathy Psych:  No delerium/psychosis/paranoia HENT: Normocephalic, Mucus membranes moist.  No thrush Neck: Supple, No tracheal deviation Chest: No pain.  Good respiratory excursion. CV:  Pulses intact.  Regular rhythm Abdomen: Soft, Nondistended.  Nontender.  No incarcerated hernias. GU:  Large R scrotal mass Ext:  SCDs BLE.  No significant edema.  No cyanosis Skin: No petechiae / purpurea.  No major sores Musculoskeletal: No severe joint pain.  Good ROM major joints   Results:   Labs: No results found for this or any previous visit (from the past 48 hour(s)).  Imaging / Studies: No results found.  Medications / Allergies: per chart  Antibiotics: Anti-infectives   Start     Dose/Rate Route Frequency Ordered Stop   10/15/13 0935  ceFAZolin (ANCEF) IVPB 2 g/50 mL premix     2 g 100 mL/hr over 30 Minutes Intravenous 60 min pre-op 10/15/13 0935     10/15/13 0935  ceFAZolin (ANCEF) IVPB 2 g/50 mL premix     2 g 100 mL/hr over 30 Minutes Intravenous 30 min pre-op 10/15/13 0935         Assessment  Johnny [name]  72 y.o. male  Day of Surgery  Procedure(s): HYDROCELECTOMY ADULT  LAPAROSCOPIC EXPLORATION POSSIBLE REPAIR OF HERNIA RIGHT GROIN   INSERTION OF MESH  Problem List:  Active Problems:   * No active hospital problems. *   Very large right scrotal hydrocele with possible small right inguinal hernia as well   Plan:  I think it is reasonable to perform diagnostic laparoscopy at the time of hydrocelectomy per the patient's wishes to determine if a hernia truly exists.. He would like to avoid multiple operations possible in hopes that I can do the case with Dr. Patsi Sears. That way, avoids an open inguinal exploration. If he does have a hernia, I would recommend repair with mesh. Could try preperitoneal approach despite prior prostatectomy. If adhesions are too dense, convert to classic open Lichtenstein type  repair:  The anatomy & physiology of the abdominal wall and pelvic floor was discussed. The pathophysiology of hernias in the inguinal and pelvic region was discussed. Natural history risks such as progressive enlargement, pain, incarceration & strangulation was discussed. Contributors to complications such as smoking, obesity, diabetes, prior surgery, etc were discussed.  I feel the risks of no intervention will lead to serious problems that outweigh the operative risks; therefore, I recommended surgery to reduce and repair the hernia. I explained laparoscopic techniques with possible need for an open approach. I noted usual use of mesh to patch and/or buttress hernia repair  Risks such as bleeding, infection, abscess, need for further treatment, heart attack, death, and other risks were discussed. I noted a good likelihood this will help address the problem. Goals of post-operative recovery were discussed as well. Possibility that this will not correct all symptoms was explained. I stressed the importance of low-impact activity, aggressive pain control,  avoiding constipation, & not pushing through pain to minimize risk of post-operative chronic pain or injury. Possibility of reherniation was discussed. We will work to minimize complications.  An educational handout further explaining the pathology & treatment options was given as well. Questions were answered. The patient expresses understanding & wishes to proceed with surgery.   -VTE prophylaxis- SCDs, etc -mobilize as tolerated to help recovery    Ardeth Sportsman, M.D., F.A.C.S. Gastrointestinal and Minimally Invasive Surgery [facility] [address] [place], [place] [address] [phone] Main / Paging   10/15/2013

## 2013-10-15 NOTE — Interval H&P Note (Signed)
History and Physical Interval Note:  10/15/2013 8:36 AM  Johnny [name]  has presented today for surgery, with the diagnosis of Large Right Hydrocele  The various methods of treatment have been discussed with the patient and family. After consideration of risks, benefits and other options for treatment, the patient has consented to  Procedure(s): HYDROCELECTOMY ADULT (Right)  LAPAROSCOPIC EXPLORATION POSSIBLE REPAIR OF HERNIA RIGHT GROIN   (Right) INSERTION OF MESH (N/A) as a surgical intervention .  The patient's history has been reviewed, patient examined, no change in status, stable for surgery.  I have reviewed the patient's chart and labs.  Questions were answered to the patient's satisfaction.     [name]

## 2013-10-15 NOTE — Anesthesia Preprocedure Evaluation (Signed)

## 2013-10-15 NOTE — Anesthesia Procedure Notes (Signed)
Procedure Name: Intubation Date/Time: 10/15/2013 10:59 AM Performed by: Jessica Priest Pre-anesthesia Checklist: Patient identified, Emergency Drugs available, Suction available and Patient being monitored Patient Re-evaluated:Patient Re-evaluated prior to inductionOxygen Delivery Method: Circle System Utilized Preoxygenation: Pre-oxygenation with 100% oxygen Intubation Type: IV induction Ventilation: Mask ventilation without difficulty Laryngoscope Size: Mac and 4 Grade View: Grade II Tube type: Oral Tube size: 8.0 mm Number of attempts: 1 Airway Equipment and Method: stylet and oral airway Placement Confirmation: ETT inserted through vocal cords under direct vision,  positive ETCO2 and breath sounds checked- equal and bilateral Secured at: 22 cm Tube secured with: Tape Dental Injury: Teeth and Oropharynx as per pre-operative assessment

## 2013-10-15 NOTE — Op Note (Signed)
Pre-operative diagnosis :   Right scrotal mass consistent with large right hydrocele, complex, with associated spermatocele, and right anal hernia  Postoperative diagnosis:  Same  Operation:  Right scrotal expiration, right hydrocelectomy (450 cc hydrocele), excision of 2 right epididymal cysts(3 cm x 3 cm each)  Surgeon:  S. Patsi Sears, MD  First assistant: None   Anesthesia:  General LMA  Preparation:  After appropriate preanesthesia, the patient was brought to the operative room placed on the operating table in the dorsal supine position where general LMA anesthesia was introduced. He remained in this position, where the scrotum was shaven, prepped with Betadine solution and draped in usual fashion. The right side was previously marked. History was reviewed.  Review history:  Orth is a 72 year old returns today for further evaluation of a hydrocele. Hx of prostate cancer s/p a bilateral nerve-sparing RLRP on January 01, 2008. His PSA on 12/11/12 was < 0.01, undetectable since treatment.  He has noted a R inguinal/scrotal mass, enlarging over the last 2 years. No restrictions.  Note: wife dies 2ndary vascuar dementia.  TNM Stage: pT2c Nx Mx  Gleason Score: 3+4=7  Surgical margins: Negative  Pretreatment PSA: 4.58  Pretreatment SHIM: 21  Now 5 years out from his radical prostatectomy. He continues to maintain excellent continence and denies any new voiding complaints. He is continued to find Levitra 20 mg very satisfactory for treatment of his erectile dysfunction.      Statement of  Likelihood of Success: Excellent. TIME-OUT observed.:  Procedure:  Using a blue marking pen, the right-sided hydrocele incision was outlined, then using a 15 blade, incision was made in the right hemiscrotum. Subcutaneous tissue was dissected with the electrosurgical unit and no bleeding was noted. Subcutaneous tissue was dissected. A very large right multiloculated hydrocele was identified, and this was  delivered into the wound. This was entered, and forward 50 cc of clear straw-colored fluid was aspirated. The sac of the hydrocele was opened and excised. Wings were sutured behind the spermatic cord with 3-0 Vicryl suture. Edges of the wound were injected with 0.5% Marcaine. 2 large epididymal cysts were identified, measuring 3 cm each, and these were excised using the electrosurgical unit, as well as blunt and sharp dissection. No bleeding was noted. The stumps were cauterized.  The testicle was then replaced in the right hemiscrotum, and a half-inch Penrose drain was placed in the deep in a portion of the scrotum. A 3-0 Vicryl suture was used to secure the Penrose in place. A safety pin was placed through the Penrose drain to keep the Penrose drain from going back up into the wound.  The wound was then closed in 2 layers with 3-0 Vicryl suture, and the skin was closed with 4-0 Vicryl suture. Dermabond was used on the skin. Fluffed dressing and mesh pants were used in the end.  The patient then underwent laparoscopic evaluation for right inguinal hernia per Dr. Michaell Cowing

## 2013-10-15 NOTE — Progress Notes (Signed)
Urology Progress Note  Day of Surgery   Subjective: Post op R hydrocelectomy, and r epididymal cyst removal.  (2). Minimal pain. Also bilateral inguinal hernia. ( Dr. Michaell Cowing).    No acute urologic events overnight. Ambulation:   negative Flatus:    negative Bowel movement  negative  Pain: some relief  Objective:  Blood pressure 117/63, pulse 71, temperature 96.6 F (35.9 C), temperature source Axillary, resp. rate 18, height 6\' 5"  (1.956 m), weight 73.71 kg (162 lb 8 oz), SpO2 97.00%.  Physical Exam:  General:  No acute distress, awake Resp: clear to auscultation bilaterally Genitourinary:  Draining  From R hemiscrotum: minimal. Penrose intact.  Foley:none       No results found for this basename: HGB, WBC, PLT,  in the last 72 hours  No results found for this basename: NA, K, CL, CO2, BUN, CREATININE, CALCIUM, MAGNESIUM, GFRNONAA, GFRAA,  in the last 72 hours   No results found for this basename: PT, INR, APTT,  in the last 72 hours   No components found with this basename: ABG,   Assessment/Plan: No foley. Pt has not voided. He will have I/O cath if unable to void.  D/c in AM RTC for drain removal.

## 2013-10-15 NOTE — Transfer of Care (Signed)
Immediate Anesthesia Transfer of Care Note  Patient: Johnny [name]  Procedure(s) Performed: Procedure(s) (LRB): HYDROCELECTOMY ADULT (Right)  LAPAROSCOPIC EXPLORATION  with REPAIR OF BILATERAL INGUINAL HERNIA (Bilateral) INSERTION OF MESH (Bilateral)  Patient Location: PACU  Anesthesia Type: General  Level of Consciousness: awake, sedated, patient cooperative and responds to stimulation  Airway & Oxygen Therapy: Patient Spontanous Breathing and Patient connected to face mask oxygen  Post-op Assessment: Report given to PACU RN, Post -op Vital signs reviewed and stable and Patient moving all extremities  Post vital signs: Reviewed and stable  Complications: No apparent anesthesia complications

## 2013-10-16 ENCOUNTER — Encounter (HOSPITAL_BASED_OUTPATIENT_CLINIC_OR_DEPARTMENT_OTHER): Payer: Self-pay | Admitting: Urology

## 2013-10-16 LAB — POCT HEMOGLOBIN-HEMACUE: Hemoglobin: 13.2 g/dL (ref 13.0–17.0)

## 2013-10-16 NOTE — Progress Notes (Signed)
Dr. Patsi Sears called to check on patient, Lyla Son NP from Dr. Hollie Beach office into check on patient.

## 2013-10-31 ENCOUNTER — Ambulatory Visit (INDEPENDENT_AMBULATORY_CARE_PROVIDER_SITE_OTHER): Payer: Medicare Other | Admitting: Surgery

## 2013-10-31 ENCOUNTER — Encounter (INDEPENDENT_AMBULATORY_CARE_PROVIDER_SITE_OTHER): Payer: Self-pay

## 2013-10-31 ENCOUNTER — Encounter (INDEPENDENT_AMBULATORY_CARE_PROVIDER_SITE_OTHER): Payer: Self-pay | Admitting: Surgery

## 2013-10-31 VITALS — BP 106/58 | HR 64 | Temp 98.7°F | Resp 14 | Ht 70.0 in | Wt 162.6 lb

## 2013-10-31 DIAGNOSIS — K4020 Bilateral inguinal hernia, without obstruction or gangrene, not specified as recurrent: Secondary | ICD-10-CM

## 2013-10-31 DIAGNOSIS — N433 Hydrocele, unspecified: Secondary | ICD-10-CM

## 2013-10-31 HISTORY — DX: Bilateral inguinal hernia, without obstruction or gangrene, not specified as recurrent: K40.20

## 2013-10-31 NOTE — Progress Notes (Signed)
Subjective:     Patient ID: Johnny [name], male   DOB: [birth year], 72 y.o.   MRN: [id number]  HPI  Johnny [name]  [birth year] [id number]  Patient Care Team: Tammy Eartha Inch, MD as PCP - General (Family Medicine) Kathi Ludwig, MD as Consulting Physician (Urology) Ardeth Sportsman, MD as Consulting Physician (General Surgery)  Procedure (Date: 10/15/2013):  POST-OPERATIVE DIAGNOSIS:  Large Right Hydrocele  Bilateral Inguinal herniae   PROCEDURE: Procedure(s):  Laparoscopic lysis of adhesions x30 minutes (1/3 of the case)  LAPAROSCOPIC EXPLORATION with REPAIR OF BILATERAL INGUINAL HERNIAe  INSERTION OF MESH  SURGEON: Surgeon(s):  Ardeth Sportsman, MD    OR FINDINGS: the patient had a small right direct/inguinal hernia pantaloon type. He had a small left indirect inguinal hernia on the left side. Some laxity in the right obturator foramen but not a true hernia. No left obturator hernia. Laxities in the femoral canal but no true hernias there.   Diagnosis 1. Hydrocele, right with epidydimal cyst - MESOTHELIAL LINED FIBROUS TISSUE, CONSISTENT WITH HYDROCELE. - BENIGN EPIDIDYMAL TISSUE. 2. Spermatic cord, right - BENIGN FIBROADIPOSE TISSUE. 3. Spermatic cord, left - BENIGN LYMPH NODE WITH FATTY REPLACEMENT. Valinda Hoar MD Pathologist, Electronic Signature (Case signed 10/16/2013) Specimen Johnny [name] and Clinical Information Specimen(s) Obtained: 1. Hydrocele, right with epidydimal cyst 2. Spermatic cord, right 3. Spermatic cord, left Specimen Clinical Information 1. large right hydrocele (kp) Johnny [name] 1. Received in formalin is a 6 x 5 cm membranous portion of translucent white tissue grossly consistent with a disrupted cyst. Adjacent to the disrupted cyst there is a 4.5 cm structure consistent with clinically stated epididymis On sectioning there is a 2.8 cm intact cyst containing clear fluid. Sections are submitted in one cassette. 2. Received in formalin are 3.5 x  3 x 1 cm of soft yellow fat. There is no tissue present grossly consistent with lymph node. Sections are submitted in one cassette. 3. Received in formalin are 3.5 x 3 x 1.2 cm of soft yellow fat. There are two vaguely nodular tan-pink to yellow structures possibly representing lymph nodes. Sections are submitted in one cassette. (GRP:ecj 10/15/2013) 1 of   This patient returns for surgical re-evaluation.  He feels like he is recovered well so far.  Did not have much pain.  He rarely needed narcotics.  Struggled with some urinary issues, but that is a typical problem since his prostatectomy.  Back to baseline.  No constipation.  Eating well.  Walking well.  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  Bruising going down.  Swelling going down.  Overall happy with how things have gone  Patient Active Problem List   Diagnosis Date Noted  . Bilateral inguinal hernia (BIH) s/p lap repair w mesh 10/15/2013 10/31/2013  . Hydrocele, right - s/p resection 10/15/2013 09/10/2013  . Personal history of colonic polyps 09/10/2013  . Prostate cancer     Past Medical History  Diagnosis Date  . GERD (gastroesophageal reflux disease)   . Hyperlipidemia   . History of prostate cancer     S/P PROSTATECTOMY 2009  . Hydrocele, right   . Organic impotence   . Wears glasses     Past Surgical History  Procedure Laterality Date  . Robot assisted laparoscopic radical prostatectomy  01-01-2008  DR BORDEN  . Tenotomy achilles tendon Right 11-11-2005  . Pilonidal cyst excision  1970'S  . Hydrocele excision Left 1994  . Hydrocele excision Right 10/15/2013    Procedure: HYDROCELECTOMY ADULT;  Surgeon: Lynelle Smoke  Bridget Hartshorn, MD;  Location: [facility];  Service: Urology;  Laterality: Right;  with excision of multiple epidyidmal cyst  . Inguinal hernia repair Bilateral 10/15/2013    Procedure:  LAPAROSCOPIC EXPLORATION  with REPAIR OF BILATERAL INGUINAL HERNIA;  Surgeon: Ardeth Sportsman, MD;  Location:  [facility];  Service: General;  Laterality: Bilateral;  . Insertion of mesh Bilateral 10/15/2013    Procedure: INSERTION OF MESH;  Surgeon: Ardeth Sportsman, MD;  Location: [facility];  Service: General;  Laterality: Bilateral;    History   Social History  . Marital Status: Married    Spouse Name: N/A    Number of Children: N/A  . Years of Education: N/A   Occupational History  . Not on file.   Social History Main Topics  . Smoking status: Former Smoker -- 0.25 packs/day for 15 years    Types: Cigarettes    Quit date: 12/06/1978  . Smokeless tobacco: Never Used  . Alcohol Use: 1.2 oz/week    2 Glasses of wine per week     Comment: daily  . Drug Use: No  . Sexual Activity: Not on file   Other Topics Concern  . Not on file   Social History Narrative  . No narrative on file    Family History  Problem Relation Age of Onset  . Cancer Son     bladder    Current Outpatient Prescriptions  Medication Sig Dispense Refill  . atorvastatin (LIPITOR) 20 MG tablet Take 20 mg by mouth every morning.       . cephALEXin (KEFLEX) 500 MG capsule Take 1 capsule (500 mg total) by mouth 2 (two) times daily.  10 capsule  0  . cephALEXin (KEFLEX) 500 MG capsule Take 1 capsule (500 mg total) by mouth 2 (two) times daily.  10 capsule  0  . cetirizine (ZYRTEC) 10 MG tablet Take 10 mg by mouth every morning.       Marland Kitchen DEXILANT 60 MG capsule Take 60 mg by mouth every morning.       . fish oil-omega-3 fatty acids 1000 MG capsule Take 1 g by mouth daily.       Marland Kitchen FOLIC ACID PO Take 1 tablet by mouth daily.       No current facility-administered medications for this visit.     No Known Allergies  BP 106/58  Pulse 64  Temp(Src) 98.7 F (37.1 C) (Temporal)  Resp 14  Ht 5\' 10"  (1.778 m)  Wt 162 lb 9.6 oz (73.755 kg)  BMI 23.33 kg/m2  No results found.  Review of Systems  Constitutional: Negative for fever, chills and diaphoresis.  HENT: Negative for sore  throat and trouble swallowing.   Eyes: Negative for photophobia and visual disturbance.  Respiratory: Negative for choking and shortness of breath.   Cardiovascular: Negative for chest pain and palpitations.  Gastrointestinal: Negative for nausea, vomiting, abdominal distention, anal bleeding and rectal pain.  Genitourinary: Negative for dysuria, urgency, difficulty urinating and testicular pain.  Musculoskeletal: Positive for arthralgias. Negative for gait problem, myalgias, neck pain and neck stiffness.  Skin: Negative for color change and rash.  Neurological: Negative for dizziness, speech difficulty, weakness and numbness.  Hematological: Negative for adenopathy.  Psychiatric/Behavioral: Negative for hallucinations, confusion and agitation.       Objective:   Physical Exam  Constitutional: He is oriented to person, place, and time. He appears well-developed and well-nourished. No distress.  HENT:  Head: Normocephalic.  Mouth/Throat: Oropharynx is clear and moist. No oropharyngeal exudate.  Eyes: Conjunctivae and EOM are normal. Pupils are equal, round, and reactive to light. No scleral icterus.  Neck: Normal range of motion. No tracheal deviation present.  Cardiovascular: Normal rate, normal heart sounds and intact distal pulses.   Pulmonary/Chest: Effort normal. No respiratory distress.  Abdominal: Soft. He exhibits no distension. There is no tenderness. Hernia confirmed negative in the right inguinal area and confirmed negative in the left inguinal area.  Incisions clean with normal healing ridges.  No hernias  Genitourinary:     Musculoskeletal: Normal range of motion. He exhibits no tenderness.  Neurological: He is alert and oriented to person, place, and time. No cranial nerve deficit. He exhibits normal muscle tone. Coordination normal.  Skin: Skin is warm and dry. No rash noted. He is not diaphoretic.  Psychiatric: He has a normal mood and affect. His behavior is normal.         Assessment:     Recovering rather well so far from bilateral inguinal hernia repairs and resection of giant right scrotal hydrocele.     Plan:     I am glad that he has not been that symptomatic & is recovering well.  Management of hydrocele per urology.  I feel the swelling in the right scrotum will gradually resolve over the next couple of months.  Scrotal support, Ice/Heat, anti-inflammatories, and time will help to resolve.  Increase activity as tolerated to regular activity.  Low impact exercise such as walking an hour a day at least ideal.  Do not push through pain.  Diet as tolerated.  Low fat high fiber diet ideal.  Bowel regimen with 30 g fiber a day and fiber supplement as needed to avoid problems.  Return to clinic as needed.   Instructions discussed.  Followup with primary care physician for other health issues as would normally be done.  Questions answered.  The patient expressed understanding and appreciation

## 2013-10-31 NOTE — Patient Instructions (Signed)
HERNIA REPAIR: POST OP INSTRUCTIONS  1. DIET: Follow a light bland diet the first 24 hours after arrival home, such as soup, liquids, crackers, etc.  Be sure to include lots of fluids daily.  Avoid fast food or heavy meals as your are more likely to get nauseated.  Eat a low fat the next few days after surgery. 2. Take your usually prescribed home medications unless otherwise directed. 3. PAIN CONTROL: a. Pain is best controlled by a usual combination of three different methods TOGETHER: i. Ice/Heat ii. Over the counter pain medication iii. Prescription pain medication b. Most patients will experience some swelling and bruising around the hernia(s) such as the bellybutton, groins, or old incisions.  Ice packs or heating pads (30-60 minutes up to 6 times a day) will help. Use ice for the first few days to help decrease swelling and bruising, then switch to heat to help relax tight/sore spots and speed recovery.  Some people prefer to use ice alone, heat alone, alternating between ice & heat.  Experiment to what works for you.  Swelling and bruising can take several weeks to resolve.   c. It is helpful to take an over-the-counter pain medication regularly for the first few weeks.  Choose one of the following that works best for you: i. Naproxen (Aleve, etc)  Two 220mg tabs twice a day ii. Ibuprofen (Advil, etc) Three 200mg tabs four times a day (every meal & bedtime) iii. Acetaminophen (Tylenol, etc) 325-650mg four times a day (every meal & bedtime) d. A  prescription for pain medication should be given to you upon discharge.  Take your pain medication as prescribed.  i. If you are having problems/concerns with the prescription medicine (does not control pain, nausea, vomiting, rash, itching, etc), please call us [phone] to see if we need to switch you to a different pain medicine that will work better for you and/or control your side effect better. ii. If you need a refill on your pain  medication, please contact your pharmacy.  They will contact our office to request authorization. Prescriptions will not be filled after 5 pm or on week-ends. 4. Avoid getting constipated.  Between the surgery and the pain medications, it is common to experience some constipation.  Increasing fluid intake and taking a fiber supplement (such as Metamucil, Citrucel, FiberCon, MiraLax, etc) 1-2 times a day regularly will usually help prevent this problem from occurring.  A mild laxative (prune juice, Milk of Magnesia, MiraLax, etc) should be taken according to package directions if there are no bowel movements after 48 hours.   5. Wash / shower every day.  You may shower over the dressings as they are waterproof.   6. Remove your waterproof bandages 5 days after surgery.  You may leave the incision open to air.  You may replace a dressing/Band-Aid to cover the incision for comfort if you wish.  Continue to shower over incision(s) after the dressing is off.    7. ACTIVITIES as tolerated:   a. You may resume regular (light) daily activities beginning the next day-such as daily self-care, walking, climbing stairs-gradually increasing activities as tolerated.  If you can walk 30 minutes without difficulty, it is safe to try more intense activity such as jogging, treadmill, bicycling, low-impact aerobics, swimming, etc. b. Save the most intensive and strenuous activity for last such as sit-ups, heavy lifting, contact sports, etc  Refrain from any heavy lifting or straining until you are off narcotics for pain control.     c. DO NOT PUSH THROUGH PAIN.  Let pain be your guide: If it hurts to do something, don't do it.  Pain is your body warning you to avoid that activity for another week until the pain goes down. d. You may drive when you are no longer taking prescription pain medication, you can comfortably wear a seatbelt, and you can safely maneuver your car and apply brakes. e. Dennis Bast may have sexual intercourse  when it is comfortable.  8. FOLLOW UP in our office a. Please call [facility] at [phone] to set up an appointment to see your surgeon in the office for a follow-up appointment approximately 2-3 weeks after your surgery. b. Make sure that you call for this appointment the day you arrive home to insure a convenient appointment time. 9.  IF YOU HAVE DISABILITY OR FAMILY LEAVE FORMS, BRING THEM TO THE OFFICE FOR PROCESSING.  DO NOT GIVE THEM TO YOUR DOCTOR.  WHEN TO CALL us [phone]: 1. Poor pain control 2. Reactions / problems with new medications (rash/itching, nausea, etc)  3. Fever over 101.5 F (38.5 C) 4. Inability to urinate 5. Nausea and/or vomiting 6. Worsening swelling or bruising 7. Continued bleeding from incision. 8. Increased pain, redness, or drainage from the incision   The clinic staff is available to answer your questions during regular business hours (8:30am-5pm).  Please don't hesitate to call and ask to speak to one of our nurses for clinical concerns.   If you have a medical emergency, go to the nearest emergency room or call 911.  A surgeon from [facility] is always on call at the hospitals in [facility], [place], [place], [place], [place]  [address] ?  [address] [address], [place],    [address] MAIN: [id number] ? TOLL FREE: [phone] ? FAX: [phone] [email or web address]  Managing Pain  Pain after surgery or related to activity is often due to strain/injury to muscle, tendon, nerves and/or incisions.  This pain is usually short-term and will improve in a few months.   Many people find it helpful to do the following things TOGETHER to help speed the process of healing and to get back to regular activity more quickly:  1. Avoid heavy physical activity a.  no lifting greater than 20 pounds b. Do not "push through" the pain.  Listen to your body and avoid positions and maneuvers than  reproduce the pain c. Walking is okay as tolerated, but go slowly and stop when getting sore.  d. Remember: If it hurts to do it, then don't do it! 2. Take Anti-inflammatory medication  a. Take with food/snack around the clock for 1-2 weeks i. This helps the muscle and nerve tissues become less irritable and calm down faster b. Choose ONE of the following over-the-counter medications: i. Naproxen 257m tabs (ex. Aleve) 1-2 pills twice a day  ii. Ibuprofen 2044mtabs (ex. Advil, Motrin) 3-4 pills with every meal and just before bedtime iii. Acetaminophen 50075mabs (Tylenol) 1-2 pills with every meal and just before bedtime 3. Use a Heating pad or Ice/Cold Pack a. 4-6 times a day b. May use warm bath/hottub  or showers 4. Try Gentle Massage and/or Stretching  a. at the area of pain many times a day b. stop if you feel pain - do not overdo it  Try these steps together to help you body heal faster and avoid making things get worse.  Doing just one of these  things may not be enough.    If you are not getting better after two weeks or are noticing you are getting worse, contact our office for further advice; we may need to re-evaluate you & see what other things we can do to help.  Exercise to Stay Healthy Exercise helps you become and stay healthy. EXERCISE IDEAS AND TIPS Choose exercises that:  You enjoy.  Fit into your day. You do not need to exercise really hard to be healthy. You can do exercises at a slow or medium level and stay healthy. You can:  Stretch before and after working out.  Try yoga, Pilates, or tai chi.  Lift weights.  Walk fast, swim, jog, run, climb stairs, bicycle, dance, or rollerskate.  Take aerobic classes. Exercises that burn about 150 calories:  Running 1  miles in 15 minutes.  Playing volleyball for 45 to 60 minutes.  Washing and waxing a car for 45 to 60 minutes.  Playing touch football for 45 minutes.  Walking 1  miles in 35  minutes.  Pushing a stroller 1  miles in 30 minutes.  Playing basketball for 30 minutes.  Raking leaves for 30 minutes.  Bicycling 5 miles in 30 minutes.  Walking 2 miles in 30 minutes.  Dancing for 30 minutes.  Shoveling snow for 15 minutes.  Swimming laps for 20 minutes.  Walking up stairs for 15 minutes.  Bicycling 4 miles in 15 minutes.  Gardening for 30 to 45 minutes.  Jumping rope for 15 minutes.  Washing windows or floors for 45 to 60 minutes. Document Released: 12/25/2010 Document Revised: 02/14/2012 Document Reviewed: 12/25/2010 [facility] Patient Information 2014 [place], [place].  Hydrocele, Adult Fluid can collect around the testicles. This fluid forms in a sac. This condition is called a hydrocele. The collected fluid causes swelling of the scrotum. Usually, it affects just one testicle. Most of the time, the condition does not cause pain. Sometimes, the hydrocele goes away on its own. Other times, surgery is needed to get rid of the fluid. CAUSES A hydrocele does not develop often. Different things can cause a hydrocele in a man, including:  Injury to the scrotum.  Infection.  X-ray of the area around the scrotum.  A tumor or cancer of the testicle.  Twisting of a testicle.  Decreased blood flow to the scrotum. SYMPTOMS   Swelling without pain. The hydrocele feels like a water-filled balloon.  Swelling with pain. This can occur if the hydrocele was caused by infection or twisting.  Mild discomfort in the scrotum.  The hydrocele may feel heavy.  Swelling that gets smaller when you lie down. DIAGNOSIS  Your caregiver will do a physical exam to decide if you have a hydrocele. This may include:  Asking questions about your overall health, today and in the past. Your caregiver may ask about any injuries, X-rays, or infections.  Pushing on your abdomen or asking you to change positions to see if the size of the hydrocele changes.  Shining a light  through the scrotum (transillumination) to see if the fluid inside the scrotum is clear.  Blood tests and urine tests to check for infection.  Imaging studies that take pictures of the scrotum and testicles. TREATMENT  Treatment depends in part on what caused the condition. Options include:  Watchful waiting. Your caregiver checks the hydrocele every so often.  Different surgeries to drain the fluid.  A needle may be put into the scrotum to drain fluid (needle aspiration). Fluid often  returns after this type of treatment.  A cut (incision) may be made in the scrotum to remove the fluid sac (hydrocelectomy).  An incision may be made in the groin to repair a hydrocele that has contact with abdominal fluids (communicating hydrocele).  Medicines to treat an infection (antibiotics). HOME CARE INSTRUCTIONS  What you need to do at home may depend on the cause of the hydrocele and type of treatment. In general:  Take all medicine as directed by your caregiver. Follow the directions carefully.  Ask your caregiver if there is anything you should not do while you recover (activities, lifting, work, sex).  If you had surgery to repair a communicating hydrocele, recovery time may vary. Ask you caregiver about your recovery time.  Avoid heavy lifting for 4 to 6 weeks.  If you had an incision on the scrotum or groin, wash it for 2 to 3 days after surgery. Do this as long as the skin is closed and there are no gaps in the wound. Wash gently, and avoid rubbing the incision.  Keep all follow-up appointments. SEEK MEDICAL CARE IF:   Your scrotum seems to be getting larger.  The area becomes more and more uncomfortable. SEEK IMMEDIATE MEDICAL CARE IF:  You have a fever. Document Released: 05/12/2010 Document Revised: 02/14/2012 Document Reviewed: 05/12/2010 [facility] Patient Information 2014 [place], [place].

## 2013-12-18 DIAGNOSIS — E782 Mixed hyperlipidemia: Secondary | ICD-10-CM

## 2013-12-18 DIAGNOSIS — J309 Allergic rhinitis, unspecified: Secondary | ICD-10-CM

## 2013-12-18 HISTORY — DX: Mixed hyperlipidemia: E78.2

## 2014-01-29 ENCOUNTER — Ambulatory Visit
Admission: RE | Admit: 2014-01-29 | Discharge: 2014-01-29 | Disposition: A | Discharge status: Home / Self Care | Payer: Medicare Other | Source: Ambulatory Visit | Attending: Otolaryngology | Admitting: Otolaryngology

## 2014-01-29 ENCOUNTER — Other Ambulatory Visit: Payer: Self-pay | Admitting: Otolaryngology

## 2014-01-29 DIAGNOSIS — J309 Allergic rhinitis, unspecified: Secondary | ICD-10-CM

## 2014-01-29 DIAGNOSIS — IMO0001 Reserved for inherently not codable concepts without codable children: Secondary | ICD-10-CM

## 2014-01-29 DIAGNOSIS — K219 Gastro-esophageal reflux disease without esophagitis: Principal | ICD-10-CM

## 2014-02-05 ENCOUNTER — Other Ambulatory Visit (HOSPITAL_COMMUNITY): Payer: Self-pay | Admitting: Otolaryngology

## 2014-02-05 ENCOUNTER — Ambulatory Visit: Payer: Medicare Other

## 2014-02-05 DIAGNOSIS — R1310 Dysphagia, unspecified: Secondary | ICD-10-CM

## 2014-02-06 ENCOUNTER — Ambulatory Visit (HOSPITAL_COMMUNITY)
Admission: RE | Admit: 2014-02-06 | Discharge: 2014-02-06 | Disposition: A | Discharge status: Home / Self Care | Payer: Medicare Other | Source: Ambulatory Visit | Attending: Otolaryngology | Admitting: Otolaryngology

## 2014-02-06 ENCOUNTER — Ambulatory Visit (HOSPITAL_COMMUNITY): Payer: Medicare Other

## 2014-02-06 DIAGNOSIS — R1313 Dysphagia, pharyngeal phase: Secondary | ICD-10-CM | POA: Insufficient documentation

## 2014-02-06 DIAGNOSIS — R1310 Dysphagia, unspecified: Secondary | ICD-10-CM | POA: Insufficient documentation

## 2014-02-06 NOTE — Procedures (Signed)
Objective Swallowing Evaluation: Modified Barium Swallowing Study  Patient Details  Name: Johnny [name] MRN: [id number] Date of Birth: November 17, 1941  Today's Date: 02/06/2014 Time: 3557-3220 SLP Time Calculation (min): 40 min  Past Medical History:  Past Medical History  Diagnosis Date  . GERD (gastroesophageal reflux disease)   . Hyperlipidemia   . History of prostate cancer     S/P PROSTATECTOMY 2009  . Hydrocele, right   . Organic impotence   . Wears glasses    Past Surgical History:  Past Surgical History  Procedure Laterality Date  . Robot assisted laparoscopic radical prostatectomy  01-01-2008  DR BORDEN  . Tenotomy achilles tendon Right 11-11-2005  . Pilonidal cyst excision  1970'S  . Hydrocele excision Left 1994  . Hydrocele excision Right 10/15/2013    Procedure: HYDROCELECTOMY ADULT;  Surgeon: Ailene Rud, MD;  Location: [facility];  Service: Urology;  Laterality: Right;  with excision of multiple epidyidmal cyst  . Inguinal hernia repair Bilateral 10/15/2013    Procedure:  LAPAROSCOPIC EXPLORATION  with REPAIR OF BILATERAL INGUINAL HERNIA;  Surgeon: Adin Hector, MD;  Location: [facility];  Service: General;  Laterality: Bilateral;  . Insertion of mesh Bilateral 10/15/2013    Procedure: INSERTION OF MESH;  Surgeon: Adin Hector, MD;  Location: [facility];  Service: General;  Laterality: Bilateral;   HPI:  Pt. is a 73 yr old seen for outpatient MBS referred after frankly aspirating during barium esophagram 01/29/14.  Pt. complains of a chronic cough for many years (15?).  Pt. with history of GERD although denies sensation of acid reflux or an esophageal globus sensation but verbalizes his suspicion of reflux causing the cough.  He also denies any neurological changes, denies pna     Assessment / Plan / Recommendation Clinical Impression  Dysphagia Diagnosis: Mild pharyngeal phase dysphagia Clinical  impression: Pt. exhibited mild pharyngeal dysphagia with a timely swallow initiation exhibited, however decreased laryngeal elevation and incomplete laryngeal closure resulting in laryngeal vestibule flash penetration during the swallow and post swallow x 1 with thin barium (from pyriform sinus residue).  Penetration episode triggered a swallow and barium was removed from vestibule.  Mildly reduced tongue base retraction and laryngeal elevation led to mild vallecular and pyriform sinus residue (unsensed) with verbal cues required for volitional swallow to effectively clear pharynx.  Esophagus briefly scanned which did not reveal abnormalitites, however MBS does not diagnose deficits below the level of the UES.  Etiology of pharyngeal dysphagia uncertain and recommend neurology consult and possible GI consult.  Recommend pt. continue regular texture diet (use caution with dry, crumbly foods) and thin liquids.  Educated pt. on importance of small sips and second swallow intermittently throughout meals for pharyngeal clearance of residue, pills one at a time with thin liquids (or whole in yogurt if needed).  Reflux precautions also reviewed.      Treatment Recommendation  Defer treatment plan to SLP at (Comment)    Diet Recommendation Regular;Thin liquid   Liquid Administration via: Cup Medication Administration: Whole meds with liquid Supervision: Patient able to self feed Postural Changes and/or Swallow Maneuvers: Seated upright 90 degrees;Upright 30-60 min after meal    Other  Recommendations Recommended Consults: Consider GI evaluation (Neurology consult) Oral Care Recommendations: Oral care BID   Follow Up Recommendations   (has appointment at outpatient ST 3/5)    Frequency and Duration        Pertinent Vitals/Pain WDL  Reason for Referral Objectively evaluate swallowing function   Oral Phase Oral Preparation/Oral Phase Oral Phase: WFL   Pharyngeal Phase Pharyngeal  Phase Pharyngeal Phase: Impaired Pharyngeal - Thin Pharyngeal - Thin Cup: Pharyngeal residue - valleculae;Pharyngeal residue - pyriform sinuses;Reduced laryngeal elevation;Reduced tongue base retraction;Penetration/Aspiration during swallow Penetration/Aspiration details (thin cup): Material enters airway, remains ABOVE vocal cords then ejected out Pharyngeal - Solids Pharyngeal - Regular: Pharyngeal residue - valleculae;Reduced tongue base retraction  Cervical Esophageal Phase    GO    Cervical Esophageal Phase Cervical Esophageal Phase: [facility]    Functional Assessment Tool Used: clinical judgement Functional Limitations: Swallowing Swallow Current Status ([id number]): At least 20 percent but less than 40 percent impaired, limited or restricted Swallow Goal Status [phone]): At least 20 percent but less than 40 percent impaired, limited or restricted Swallow Discharge Status [phone]): At least 20 percent but less than 40 percent impaired, limited or restricted    Houston Siren M.Ed [employer] [phone]  02/06/2014

## 2014-02-07 ENCOUNTER — Encounter: Payer: Self-pay | Admitting: Internal Medicine

## 2014-02-07 ENCOUNTER — Ambulatory Visit: Payer: Medicare Other

## 2014-02-12 ENCOUNTER — Encounter: Payer: Self-pay | Admitting: *Deleted

## 2014-02-12 ENCOUNTER — Encounter: Payer: Self-pay | Admitting: Neurology

## 2014-02-13 ENCOUNTER — Ambulatory Visit: Payer: Medicare Other | Attending: Otolaryngology

## 2014-02-13 DIAGNOSIS — R1313 Dysphagia, pharyngeal phase: Secondary | ICD-10-CM | POA: Insufficient documentation

## 2014-02-14 ENCOUNTER — Encounter (INDEPENDENT_AMBULATORY_CARE_PROVIDER_SITE_OTHER): Payer: Self-pay

## 2014-02-14 ENCOUNTER — Ambulatory Visit (INDEPENDENT_AMBULATORY_CARE_PROVIDER_SITE_OTHER): Payer: Medicare Other | Admitting: Neurology

## 2014-02-14 ENCOUNTER — Encounter: Payer: Self-pay | Admitting: Neurology

## 2014-02-14 VITALS — BP 109/61 | HR 67 | Ht 70.0 in | Wt 157.0 lb

## 2014-02-14 DIAGNOSIS — D518 Other vitamin B12 deficiency anemias: Secondary | ICD-10-CM

## 2014-02-14 DIAGNOSIS — R6889 Other general symptoms and signs: Secondary | ICD-10-CM

## 2014-02-14 DIAGNOSIS — R1314 Dysphagia, pharyngoesophageal phase: Secondary | ICD-10-CM

## 2014-02-14 HISTORY — DX: Dysphagia, pharyngoesophageal phase: R13.14

## 2014-02-14 NOTE — Patient Instructions (Signed)
Dysphagia  Swallowing problems (dysphagia) occur when solids and liquids seem to stick in your throat on the way down to your stomach, or the food takes longer to get to the stomach. Other symptoms include regurgitating food, noises coming from the throat, chest discomfort with swallowing, and a feeling of fullness or the feeling of something being stuck in your throat when swallowing. When blockage in your throat is complete it may be associated with drooling.  CAUSES   Problems with swallowing may occur because of problems with the muscles. The food cannot be propelled in the usual manner into your stomach. You may have ulcers, scar tissue, or inflammation in the tube down which food travels from your mouth to your stomach (esophagus), which blocks food from passing normally into the stomach. Causes of inflammation include:  · Acid reflux from your stomach into your esophagus.  · Infection.  · Radiation treatment for cancer.  · Medicines taken without enough fluids to wash them down into your stomach.  You may have nerve problems that prevent signals from being sent to the muscles of your esophagus to contract and move your food down to your stomach. Globus pharyngeus is a relatively common problem in which there is a sense of an obstruction or difficulty in swallowing, without any physical abnormalities of the swallowing passages being found. This problem usually improves over time with reassurance and testing to rule out other causes.  DIAGNOSIS  Dysphagia can be diagnosed and its cause can be determined by tests in which you swallow a white substance that helps illuminate the inside of your throat (contrast medium) while X-rays are taken. Sometimes a flexible telescope that is inserted down your throat (endoscopy) to look at your esophagus and stomach is used.  TREATMENT   · If the dysphagia is caused by acid reflux or infection, medicines may be used.  · If the dysphagia is caused by problems with your  swallowing muscles, swallowing therapy may be used to help you strengthen your swallowing muscles.  · If the dysphagia is caused by a blockage or mass, procedures to remove the blockage may be done.  HOME CARE INSTRUCTIONS  · Try to eat soft food that is easier to swallow and check your weight on a daily basis to be sure that it is not decreasing.  · Be sure to drink liquids when sitting upright (not lying down).  SEEK MEDICAL CARE IF:  · You are losing weight because you are unable to swallow.  · You are coughing when you drink liquids (aspiration).  · You are coughing up partially digested food.  SEEK IMMEDIATE MEDICAL CARE IF:  · You are unable to swallow your own saliva .  · You are having shortness of breath or a fever, or both.  · You have a hoarse voice along with difficulty swallowing.  MAKE SURE YOU:  · Understand these instructions.  · Will watch your condition.  · Will get help right away if you are not doing well or get worse.  Document Released: 11/19/2000 Document Revised: 07/25/2013 Document Reviewed: 05/11/2013  [employer]® Patient Information ©2014 [employer].

## 2014-02-14 NOTE — Progress Notes (Signed)
Reason for visit: Dysphagia  Johnny [name] is a 73 y.o. male  History of present illness:  Johnny [name] is a 73 year old right-handed white male with a history of problems with coughing that has been persistent over the last 15 years. The patient has gone to see Dr. Simeon Craft from ENT, and a modified barium swallow was done revealing evidence of mild pharyngeal phase dysphagia. The patient has not had any modifications of his diet. Because of the dysphagia, the patient is referred to this office to evaluate the patient for a possible neurologic etiology of this problem. The patient indicates that over the last 15 years, he has not had any numbness or weakness of the extremities, he denies any joint or muscle discomfort, and he denies any double vision, ptosis, alteration in speech or problems with chewing. The patient reports no headaches, or any significant decline in memory or concentration. The patient reports no problems with balance or problems controlling the bowels or the bladder. The patient does not know of any other issues that have evolved with the problems with coughing. The patient is sent to this office for further evaluation.  Past Medical History  Diagnosis Date  . GERD (gastroesophageal reflux disease)   . Hyperlipidemia   . History of prostate cancer     S/P PROSTATECTOMY 2009  . Hydrocele, right   . Organic impotence   . Wears glasses   . 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 Robot assisted laparoscopic radical prostatectomy  01-01-2008  DR BORDEN  . Tenotomy achilles tendon Right 11-11-2005  . Pilonidal cyst excision  1970'S  . Hydrocele excision Left 1994  . Hydrocele excision Right 10/15/2013    Procedure: HYDROCELECTOMY ADULT;  Surgeon: Ailene Rud, MD;  Location: [facility];  Service: Urology;  Laterality: Right;  with excision of multiple epidyidmal cyst  . Inguinal hernia  repair Bilateral 10/15/2013    Procedure:  LAPAROSCOPIC EXPLORATION  with REPAIR OF BILATERAL INGUINAL HERNIA;  Surgeon: Adin Hector, MD;  Location: [facility];  Service: General;  Laterality: Bilateral;  . Insertion of mesh Bilateral 10/15/2013    Procedure: INSERTION OF MESH;  Surgeon: Adin Hector, MD;  Location: [facility];  Service: General;  Laterality: Bilateral;    Family History  Problem Relation Age of Onset  . Bladder Cancer Son   . Hypertension Mother     Social history:  reports that he quit smoking about 35 years ago. His smoking use included Cigarettes. He has a 3.75 pack-year smoking history. He has never used smokeless tobacco. He reports that he drinks about 1.2 ounces of alcohol per week. He reports that he does not use illicit drugs.  Medications:  Current Outpatient Prescriptions on File Prior to Visit  Medication Sig Dispense Refill  . aspirin 81 MG tablet Take 81 mg by mouth daily.      Marland Kitchen atorvastatin (LIPITOR) 20 MG tablet Take 20 mg by mouth every morning.       Marland Kitchen DEXILANT 60 MG capsule Take 60 mg by mouth every morning.       . fish oil-omega-3 fatty acids 1000 MG capsule Take 1 g by mouth daily.       Marland Kitchen FOLIC ACID PO Take 1 tablet by mouth daily.       No current facility-administered medications on file prior to visit.     No Known Allergies  ROS:  Out of a complete 14 system review of symptoms, the patient complains only of the following symptoms, and all other reviewed systems are negative.  Cough Allergies, runny nose  Blood pressure 109/61, pulse 67, height 5\' 10"  (1.778 m), weight 157 lb (71.215 kg).  Physical Exam  General: The patient is alert and cooperative at the time of the examination.  Eyes: Pupils are equal, round, and reactive to light. Discs are flat bilaterally.  Neck: The neck is supple, no carotid bruits are noted.  Respiratory: The respiratory examination is clear.  Cardiovascular:  The cardiovascular examination reveals a regular rate and rhythm, no obvious murmurs or rubs are noted.  Skin: Extremities are without significant edema.  Neurologic Exam  Mental status: The patient is alert and oriented x 3 at the time of the examination. The patient has apparent normal recent and remote memory, with an apparently normal attention span and concentration ability.  Cranial nerves: Facial symmetry is present. There is good sensation of the face to pinprick and soft touch bilaterally. The strength of the facial muscles and the muscles to head turning and shoulder shrug are normal bilaterally. Speech is well enunciated, no aphasia or dysarthria is noted. Extraocular movements are full. Visual fields are full. The tongue is midline, and the patient has symmetric elevation of the soft palate. Gag reflex is slightly depressed. No obvious hearing deficits are noted.  Motor: The motor testing reveals 5 over 5 strength of all 4 extremities. Good symmetric motor tone is noted throughout.  Sensory: Sensory testing is intact to pinprick, soft touch, vibration sensation, and position sense on all 4 extremities. There is no evidence of a stocking pattern pinprick sensory deficit. No evidence of extinction is noted.  Coordination: Cerebellar testing reveals good finger-nose-finger and heel-to-shin bilaterally.  Gait and station: Gait is normal. Tandem gait is normal. Romberg is negative. No drift is seen.  Reflexes: Deep tendon reflexes are symmetric and normal bilaterally. Toes are downgoing bilaterally.   Assessment/Plan:  1. Mild pharyngeal phase dysphagia  The patient has no other associated symptoms with the dysphagia issue. The patient will be sent for further evaluation to look for neurologic etiologies of this problem. The patient otherwise has a relatively unremarkable clinical examination. There is no history consistent with a primary muscle disease, neuromuscular transmission  disorder, or a peripheral neuropathy. The patient will be sent for MRI of the brain, and he will have blood work done today. The patient will followup through this office if needed.  Johnny Alexanders MD 02/14/2014 7:20 PM  [facility] [address] [place], [place] [address]  Phone [phone] Fax [phone]

## 2014-02-18 ENCOUNTER — Ambulatory Visit: Payer: Medicare Other

## 2014-02-19 LAB — TSH: TSH: 2.05 u[IU]/mL (ref 0.450–4.500)

## 2014-02-19 LAB — ANA W/REFLEX: Anti Nuclear Antibody(ANA): NEGATIVE

## 2014-02-19 LAB — ACETYLCHOLINE RECEPTOR, BINDING: AChR Binding Ab, Serum: <0.03 nmol/L (ref 0.00–0.24)

## 2014-02-19 LAB — CK: Total CK: 56 U/L (ref 24–204)

## 2014-02-19 LAB — SEDIMENTATION RATE: SED RATE: 9 mm/h (ref 0–30)

## 2014-02-19 LAB — ANGIOTENSIN CONVERTING ENZYME: Angio Convert Enzyme: 30 U/L (ref 14–82)

## 2014-02-19 LAB — VITAMIN B12: VITAMIN B 12: 471 pg/mL (ref 211–946)

## 2014-02-20 ENCOUNTER — Ambulatory Visit: Payer: Medicare Other

## 2014-02-20 ENCOUNTER — Ambulatory Visit
Admission: RE | Admit: 2014-02-20 | Discharge: 2014-02-20 | Disposition: A | Discharge status: Home / Self Care | Payer: Medicare Other | Source: Ambulatory Visit | Attending: Neurology | Admitting: Neurology

## 2014-02-20 DIAGNOSIS — R1314 Dysphagia, pharyngoesophageal phase: Secondary | ICD-10-CM

## 2014-02-21 ENCOUNTER — Telehealth: Payer: Self-pay | Admitting: Neurology

## 2014-02-21 NOTE — Telephone Encounter (Signed)
I called the patient. The MRI the brain is essentially normal. One tiny right frontal white matter lesion seen. This certainly would not cause dysphagia. Blood work was completely normal. No evidence of any readily identifiable neurologic etiology for the dysphagia is noted.   MRI brain 02/21/2014:  Impression   Equivocal MRI brain (without) demonstrating: 1. Subtle right frontal punctate focus of non-specific gliosis (series 7  image 18).  2. 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 3. No acute findings.

## 2014-03-06 ENCOUNTER — Encounter: Payer: Self-pay | Admitting: Internal Medicine

## 2014-03-06 ENCOUNTER — Ambulatory Visit (INDEPENDENT_AMBULATORY_CARE_PROVIDER_SITE_OTHER): Payer: Medicare Other | Admitting: Internal Medicine

## 2014-03-06 VITALS — BP 100/62 | HR 68 | Ht 70.0 in | Wt 158.2 lb

## 2014-03-06 DIAGNOSIS — J387 Other diseases of larynx: Secondary | ICD-10-CM

## 2014-03-06 DIAGNOSIS — R059 Cough, unspecified: Secondary | ICD-10-CM

## 2014-03-06 DIAGNOSIS — K219 Gastro-esophageal reflux disease without esophagitis: Secondary | ICD-10-CM

## 2014-03-06 DIAGNOSIS — R05 Cough: Secondary | ICD-10-CM

## 2014-03-06 MED ORDER — LANSOPRAZOLE 30 MG PO CPDR: 30.0000 mg | DELAYED_RELEASE_CAPSULE | Freq: Two times a day (BID) | ORAL | Status: DC

## 2014-03-06 NOTE — Progress Notes (Signed)
Johnny [name] Nov 29, 1941 [id number]  Note: This dictation was prepared with Dragon digital system. Any transcriptional errors that result from this procedure are unintentional.   History of Present Illness:  This is a 73 year old white male who comes for evaluation of cough and occasional hoarseness. 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 He has been complaining of having a cough for past 15 years. He was evaluated by Dr. Simeon Craft, ENT. A barium esophagram showed frank aspiration , normal peristalsis, barium tablet passed without delay. There was no stricture. A subsequent modified barium swallow showed no frank  Aspiration, mild pharyngeal phase dysphagia with decreased laryngeal elevation as well as incomplete laryngeal closure. He also had flash penetration during the swallow with thin barium. He has mildly reduced tongue base retraction. A neurology consult by Dr.Willis has been completed but no primary neurological problem has been found. He is here today to address the issue of gastroesophageal reflux. He denies ever having heartburn. He also denies dysphagia. His coughing occurs usually within an hour of eating. He denies nocturnal cough. He has been sleeping with head of the bed elevation.for many years. He denies any signs of early satiety or gastroparesis. He has over the years been on Prevacid 30 mg a day and most recently on Dexillant 60 mg daily. He thinks that his cough has decreased somewhat.    Past Medical History  Diagnosis Date  . GERD (gastroesophageal reflux disease)   . Hyperlipidemia   . History of prostate cancer     S/P PROSTATECTOMY 2009  . Hydrocele, right   . Organic impotence   . Wears glasses   . 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 Robot assisted laparoscopic radical prostatectomy  01-01-2008  DR BORDEN  . Tenotomy achilles tendon Right 11-11-2005  . Pilonidal cyst excision  1970'S  .  Hydrocele excision Left 1994  . Hydrocele excision Right 10/15/2013    Procedure: HYDROCELECTOMY ADULT;  Surgeon: Ailene Rud, MD;  Location: [facility];  Service: Urology;  Laterality: Right;  with excision of multiple epidyidmal cyst  . Inguinal hernia repair Bilateral 10/15/2013    Procedure:  LAPAROSCOPIC EXPLORATION  with REPAIR OF BILATERAL INGUINAL HERNIA;  Surgeon: Adin Hector, MD;  Location: [place];  Service: General;  Laterality: Bilateral;  . Insertion of mesh Bilateral 10/15/2013    Procedure: INSERTION OF MESH;  Surgeon: Adin Hector, MD;  Location: [facility];  Service: General;  Laterality: Bilateral;    No Known Allergies  Family history and social history have been reviewed.  Review of Systems: Weight loss of 40 pounds since since the death of his wife.  The remainder of the 10 point ROS is negative except as outlined in the H&P  Physical Exam: General Appearance Well developed, in no distress Eyes  Non icteric  HEENT  Non traumatic, normocephalicnormal voice. Patient did not cough during our interview  Mouth No lesion, tongue papillated, no cheilosis Neck Supple without adenopathy, thyroid not enlarged, no carotid bruits, no JVD Lungs Clear to auscultation bilaterally COR Normal S1, normal S2, regular rhythm, no murmur, quiet precordium Abdomen Soft nontender with normoactive bowel sounds  Rectal Not done  Extremities  No pedal edema Skin No lesions Neurological Alert and oriented x 3 Psychological Normal mood and affect  Assessment and Plan:   Problem #1 Chronic cough and intermittent aspiration on a barium esophagram. A speech pathology evaluation confirmed  oropharyngeal dysphagia. There is no radiographic evidence of stricture. I have discussed at length with him the possibility of acid reflux and LPR causing his problems. He is reluctant to undergo an upper endoscopy or Bravo probe monitoring  to assess for acid reflux. He would like to try high doses of PPI for 6 weeks. He will be on Prevacid 30 mg twice today and strict antireflux measures. He will do his swallowing exercises as suggested by speech pathologist. He will come back in 6 weeks and if he does not improve, we will most likely proceed with an upper endoscopy with placement of bravo probe and possibly with esophageal manometry to assess his upper as well as lower esophageal sphincter as and peristalsis. I also mentioned a referral to the [facility] in [place] which I have found at times very helpful in assessing a patient's cough.    Delfin Edis 03/06/2014

## 2014-03-06 NOTE — Patient Instructions (Addendum)
We have sent the following medications to your pharmacy for you to pick up at your convenience: Prevacid twice daily (in place of Dexilant)  Please follow up with Dr Olevia Perches in 6 weeks.  CC:Dr Precious Haws, Dr Simeon Craft, Dr Jannifer Franklin,

## 2014-03-11 ENCOUNTER — Telehealth: Payer: Self-pay | Admitting: *Deleted

## 2014-03-11 MED ORDER — ESOMEPRAZOLE MAGNESIUM 40 MG PO CPDR: 40.0000 mg | DELAYED_RELEASE_CAPSULE | Freq: Two times a day (BID) | ORAL | Status: DC

## 2014-03-11 NOTE — Telephone Encounter (Signed)
Patient's insurance will not cover Prevacid. They will cover Name brand Nexium or omeprazole. Therefore, we will try patient on Nexium twice daily in place of Prevacid. Left message for patient to call back.

## 2014-03-12 NOTE — Telephone Encounter (Signed)
Left message for patient to call back  

## 2014-03-12 NOTE — Telephone Encounter (Signed)
Patient received Nexium.

## 2014-03-18 ENCOUNTER — Ambulatory Visit: Payer: Medicare Other

## 2014-03-21 ENCOUNTER — Encounter: Payer: Medicare Other | Admitting: Speech Pathology

## 2014-04-18 ENCOUNTER — Ambulatory Visit: Payer: Medicare Other | Admitting: Internal Medicine

## 2014-04-23 ENCOUNTER — Encounter (HOSPITAL_COMMUNITY): Payer: Self-pay | Admitting: Pharmacy Technician

## 2014-04-23 ENCOUNTER — Ambulatory Visit (INDEPENDENT_AMBULATORY_CARE_PROVIDER_SITE_OTHER): Payer: Medicare Other | Admitting: Internal Medicine

## 2014-04-23 ENCOUNTER — Encounter: Payer: Self-pay | Admitting: Internal Medicine

## 2014-04-23 VITALS — BP 110/74 | HR 66 | Ht 70.0 in | Wt 156.0 lb

## 2014-04-23 DIAGNOSIS — R05 Cough: Secondary | ICD-10-CM

## 2014-04-23 DIAGNOSIS — J387 Other diseases of larynx: Secondary | ICD-10-CM

## 2014-04-23 DIAGNOSIS — K219 Gastro-esophageal reflux disease without esophagitis: Secondary | ICD-10-CM

## 2014-04-23 DIAGNOSIS — R059 Cough, unspecified: Secondary | ICD-10-CM

## 2014-04-23 NOTE — Patient Instructions (Addendum)
You have been scheduled for an endoscopy with bravo probe using propofol. Please follow written instructions given to you at your visit today. If you use inhalers (even only as needed), please bring them with you on the day of your procedure. Your physician has requested that you go to www.startemmi.com and enter the access code given to you at your visit today. This web site gives a general overview about your procedure. However, you should still follow specific instructions given to you by our office regarding your preparation for the procedure.  Please continue Prevacid and Dexilant for your procedure.  CC: Dr Precious Haws

## 2014-04-23 NOTE — Progress Notes (Signed)
Johnny [name] Feb 22, 1941 [id number]  Note: This dictation was prepared with Dragon digital system. Any transcriptional errors that result from this procedure are unintentional.   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 His last appointment was on 03/06/2014. A barium esophagram in February 2015 showed frank aspiration but the modified barium swallow on February 15, 2014 showed only mild pharyngeal phase dysphagia with incomplete laryngeal closure, penetration but no frank aspiration. He has been on a high dose acid reducing regimen which includes Dexilant 60 mg at bedtime and Prevacid 15 mg in the morning and 15 mg at noontime. He continues to cough. He expectorates small amounts of clear mucus. He denies hoarseness. There is no food regurgitation. The cough is present mostly when he lays down at night and usually does not bother him at all when he stands up but it sometimes bothers him when he plays bridge. His weight has been stable.    Past Medical History  Diagnosis Date  . GERD (gastroesophageal reflux disease)   . Hyperlipidemia   . History of prostate cancer     S/P PROSTATECTOMY 2009  . Hydrocele, right   . Organic impotence   . Wears glasses   . 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 Robot assisted laparoscopic radical prostatectomy  01-01-2008  DR BORDEN  . Tenotomy achilles tendon Right 11-11-2005  . Pilonidal cyst excision  1970'S  . Hydrocele excision Left 1994  . Hydrocele excision Right 10/15/2013    Procedure: HYDROCELECTOMY ADULT;  Surgeon: Ailene Rud, MD;  Location: [facility];  Service: Urology;  Laterality: Right;  with excision of multiple epidyidmal cyst  . Inguinal hernia repair Bilateral 10/15/2013    Procedure:  LAPAROSCOPIC EXPLORATION  with REPAIR OF BILATERAL INGUINAL HERNIA;  Surgeon: Adin Hector, MD;  Location: [facility];  Service: General;  Laterality: Bilateral;  . Insertion of mesh Bilateral 10/15/2013    Procedure: INSERTION OF MESH;  Surgeon: Adin Hector, MD;  Location: [facility];  Service: General;  Laterality: Bilateral;    No Known Allergies  Family history and social history have been reviewed.  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 His modified barium swallow showed incomplete laryngeal closure. We will proceed with an upper endoscopy with Bravo probe which will be done on PPIs. Depending on the results, we may consider referring him to the [facility] in [place] for further evaluation.    [name] 04/23/2014

## 2014-04-24 ENCOUNTER — Encounter (HOSPITAL_COMMUNITY): Payer: Self-pay | Admitting: *Deleted

## 2014-05-04 NOTE — H&P (View-Only) (Signed)
Johnny [name] 09/13/1941 [id number]  Note: This dictation was prepared with Dragon digital system. Any transcriptional errors that result from this procedure are unintentional.   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 His last appointment was on 03/06/2014. A barium esophagram in February 2015 showed frank aspiration but the modified barium swallow on February 15, 2014 showed only mild pharyngeal phase dysphagia with incomplete laryngeal closure, penetration but no frank aspiration. He has been on a high dose acid reducing regimen which includes Dexilant 60 mg at bedtime and Prevacid 15 mg in the morning and 15 mg at noontime. He continues to cough. He expectorates small amounts of clear mucus. He denies hoarseness. There is no food regurgitation. The cough is present mostly when he lays down at night and usually does not bother him at all when he stands up but it sometimes bothers him when he plays bridge. His weight has been stable.    Past Medical History  Diagnosis Date  . GERD (gastroesophageal reflux disease)   . Hyperlipidemia   . History of prostate cancer     S/P PROSTATECTOMY 2009  . Hydrocele, right   . Organic impotence   . Wears glasses   . 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 Robot assisted laparoscopic radical prostatectomy  01-01-2008  DR BORDEN  . Tenotomy achilles tendon Right 11-11-2005  . Pilonidal cyst excision  1970'S  . Hydrocele excision Left 1994  . Hydrocele excision Right 10/15/2013    Procedure: HYDROCELECTOMY ADULT;  Surgeon: Sigmund I Tannenbaum, MD;  Location: [facility];  Service: Urology;  Laterality: Right;  with excision of multiple epidyidmal cyst  . Inguinal hernia repair Bilateral 10/15/2013    Procedure:  LAPAROSCOPIC EXPLORATION  with REPAIR OF BILATERAL INGUINAL HERNIA;  Surgeon: Steven C. Gross, MD;  Location: [facility]  ;  Service: General;  Laterality: Bilateral;  . Insertion of mesh Bilateral 10/15/2013    Procedure: INSERTION OF MESH;  Surgeon: Steven C. Gross, MD;  Location: [facility];  Service: General;  Laterality: Bilateral;    No Known Allergies  Family history and social history have been reviewed.  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 His modified barium swallow showed incomplete laryngeal closure. We will proceed with an upper endoscopy with Bravo probe which will be done on PPIs. Depending on the results, we may consider referring him to the [facility] in [place] for further evaluation.    Johnny [name] 04/23/2014      

## 2014-05-04 NOTE — Interval H&P Note (Signed)
History and Physical Interval Note:  05/04/2014 10:35 PM  Johnny [name]  has presented today for surgery, with the diagnosis of GERD  The various methods of treatment have been discussed with the patient and family. After consideration of risks, benefits and other options for treatment, the patient has consented to  Procedure(s): ESOPHAGOGASTRODUODENOSCOPY (EGD) WITH PROPOFOL (N/A) BRAVO PH STUDY (N/A) as a surgical intervention .  The patient's history has been reviewed, patient examined, no change in status, stable for surgery.  I have reviewed the patient's chart and labs.  Questions were answered to the patient's satisfaction.     [name]

## 2014-05-06 ENCOUNTER — Ambulatory Visit (HOSPITAL_COMMUNITY): Payer: Medicare Other | Admitting: Anesthesiology

## 2014-05-06 ENCOUNTER — Encounter (HOSPITAL_COMMUNITY): Payer: Medicare Other | Admitting: Anesthesiology

## 2014-05-06 ENCOUNTER — Other Ambulatory Visit: Payer: Self-pay | Admitting: *Deleted

## 2014-05-06 ENCOUNTER — Encounter (HOSPITAL_COMMUNITY): Payer: Self-pay | Admitting: *Deleted

## 2014-05-06 ENCOUNTER — Encounter (HOSPITAL_COMMUNITY)
Admission: RE | Disposition: A | Discharge status: Home / Self Care | Payer: Self-pay | Source: Ambulatory Visit | Attending: Internal Medicine

## 2014-05-06 ENCOUNTER — Ambulatory Visit (HOSPITAL_COMMUNITY)
Admission: RE | Admit: 2014-05-06 | Discharge: 2014-05-06 | Disposition: A | Discharge status: Home / Self Care | Payer: Medicare Other | Source: Ambulatory Visit | Attending: Internal Medicine | Admitting: Internal Medicine

## 2014-05-06 DIAGNOSIS — N529 Male erectile dysfunction, unspecified: Secondary | ICD-10-CM | POA: Insufficient documentation

## 2014-05-06 DIAGNOSIS — R1314 Dysphagia, pharyngoesophageal phase: Secondary | ICD-10-CM

## 2014-05-06 DIAGNOSIS — E785 Hyperlipidemia, unspecified: Secondary | ICD-10-CM | POA: Insufficient documentation

## 2014-05-06 DIAGNOSIS — R109 Unspecified abdominal pain: Secondary | ICD-10-CM

## 2014-05-06 DIAGNOSIS — K219 Gastro-esophageal reflux disease without esophagitis: Secondary | ICD-10-CM | POA: Insufficient documentation

## 2014-05-06 HISTORY — PX: ESOPHAGOGASTRODUODENOSCOPY (EGD) WITH PROPOFOL: SHX5813

## 2014-05-06 HISTORY — PX: BRAVO PH STUDY: SHX5421

## 2014-05-06 SURGERY — ESOPHAGOGASTRODUODENOSCOPY (EGD) WITH PROPOFOL
Anesthesia: Monitor Anesthesia Care

## 2014-05-06 MED ORDER — GLYCOPYRROLATE 0.2 MG/ML IJ SOLN
INTRAMUSCULAR | Status: AC
  Filled 2014-05-06: qty 1

## 2014-05-06 MED ORDER — PROPOFOL INFUSION 10 MG/ML OPTIME
INTRAVENOUS | Status: DC | PRN
  Administered 2014-05-06: 200 ug/kg/min via INTRAVENOUS

## 2014-05-06 MED ORDER — PROPOFOL 10 MG/ML IV BOLUS
INTRAVENOUS | Status: AC
  Filled 2014-05-06: qty 20

## 2014-05-06 MED ORDER — LACTATED RINGERS IV SOLN
INTRAVENOUS | Status: DC
  Administered 2014-05-06: 1000 mL via INTRAVENOUS

## 2014-05-06 MED ORDER — PROMETHAZINE HCL 25 MG/ML IJ SOLN: 6.2500 mg | INTRAMUSCULAR | Status: DC | PRN

## 2014-05-06 MED ORDER — BUTAMBEN-TETRACAINE-BENZOCAINE 2-2-14 % EX AERO
INHALATION_SPRAY | CUTANEOUS | Status: DC | PRN
  Administered 2014-05-06: 2 via TOPICAL

## 2014-05-06 MED ORDER — SODIUM CHLORIDE 0.9 % IV SOLN: INTRAVENOUS | Status: DC

## 2014-05-06 MED ORDER — LIDOCAINE HCL (CARDIAC) 20 MG/ML IV SOLN
INTRAVENOUS | Status: DC | PRN
  Administered 2014-05-06: 100 mg via INTRAVENOUS

## 2014-05-06 MED ORDER — GLYCOPYRROLATE 0.2 MG/ML IJ SOLN
INTRAMUSCULAR | Status: DC | PRN
  Administered 2014-05-06: 0.2 mg via INTRAVENOUS

## 2014-05-06 MED ORDER — LIDOCAINE HCL (CARDIAC) 20 MG/ML IV SOLN
INTRAVENOUS | Status: AC
  Filled 2014-05-06: qty 5

## 2014-05-06 SURGICAL SUPPLY — 14 items

## 2014-05-06 NOTE — Discharge Instructions (Signed)
Esophagogastroduodenoscopy  Esophagogastroduodenoscopy (EGD) is a procedure to examine the lining of the esophagus, stomach, and first part of the small intestine (duodenum). A long, flexible, lighted tube with a camera attached (endoscope) is inserted down the throat to view these organs. This procedure is done to detect problems or abnormalities, such as inflammation, bleeding, ulcers, or growths, in order to treat them. The procedure lasts about 5 20 minutes. It is usually an outpatient procedure, but it may need to be performed in emergency cases in the hospital.  LET YOUR CAREGIVER KNOW ABOUT:   · Allergies to food or medicine.  · All medicines you are taking, including vitamins, herbs, eyedrops, and over-the-counter medicines and creams.  · Use of steroids (by mouth or creams).  · Previous problems you or members of your family have had with the use of anesthetics.  · Any blood disorders you have.  · Previous surgeries you have had.  · Other health problems you have.  · Possibility of pregnancy, if this applies.  RISKS AND COMPLICATIONS   Generally, EGD is a safe procedure. However, as with any procedure, complications can occur. Possible complications include:  · Infection.  · Bleeding.  · Tearing (perforation) of the esophagus, stomach, or duodenum.  · Difficulty breathing or not being able to breath.  · Excessive sweating.  · Spasms of the larynx.  · Slowed heartbeat.  · Low blood pressure.  BEFORE THE PROCEDURE  · Do not eat or drink anything for 6 8 hours before the procedure or as directed by your caregiver.  · Ask your caregiver about changing or stopping your regular medicines.  · If you wear dentures, be prepared to remove them before the procedure.  · Arrange for someone to drive you home after the procedure.  PROCEDURE   · A vein will be accessed to give medicines and fluids. A medicine to relax you (sedative) and a pain reliever will be given through that access into the vein.  · A numbing medicine  (local anesthetic) may be sprayed on your throat for comfort and to stop you from gagging or coughing.  · A mouth guard may be placed in your mouth to protect your teeth and to keep you from biting on the endoscope.  · You will be asked to lie on your left side.  · The endoscope is inserted down your throat and into the esophagus, stomach, and duodenum.  · Air is put through the endoscope to allow your caregiver to view the lining of your esophagus clearly.  · The esophagus, stomach, and duodenum is then examined. During the exam, your caregiver may:  · Remove tissue to be examined under a microscope (biopsy) for inflammation, infection, or other medical problems.  · Remove growths.  · Remove objects (foreign bodies) that are stuck.  · Treat any bleeding with medicines or other devices that stop tissues from bleeding (hot cauters, clipping devices).  · Widen (dilate) or stretch narrowed areas of the esophagus and stomach.  · The endoscope will then be withdrawn.  AFTER THE PROCEDURE  · You will be taken to a recovery area to be monitored. You will be able to go home once you are stable and alert.  · Do not eat or drink anything until the local anesthetic and numbing medicines have worn off. You may choke.  · It is normal to feel bloated, have pain with swallowing, or have a sore throat for a short time. This will wear off.  ·   Your caregiver should be able to discuss his or her findings with you. It will take longer to discuss the test results if any biopsies were taken.  Document Released: 03/25/2005 Document Revised: 11/08/2012 Document Reviewed: 10/25/2012  [employer]® Patient Information ©2014 [employer].

## 2014-05-06 NOTE — Transfer of Care (Signed)
Immediate Anesthesia Transfer of Care Note  Patient: Johnny [name]  Procedure(s) Performed: Procedure(s): ESOPHAGOGASTRODUODENOSCOPY (EGD) WITH PROPOFOL (N/A) BRAVO PH STUDY (N/A)  Patient Location: PACU  Anesthesia Type:MAC  Level of Consciousness: awake, alert , oriented and patient cooperative  Airway & Oxygen Therapy: Patient Spontanous Breathing and Patient connected to face mask oxygen  Post-op Assessment: Report given to PACU RN, Post -op Vital signs reviewed and stable and Patient moving all extremities X 4  Post vital signs: stable  Complications: No apparent anesthesia complications

## 2014-05-06 NOTE — Anesthesia Postprocedure Evaluation (Signed)
  Anesthesia Post-op Note  Patient: Johnny [name]  Procedure(s) Performed: Procedure(s) (LRB): ESOPHAGOGASTRODUODENOSCOPY (EGD) WITH PROPOFOL (N/A) BRAVO PH STUDY (N/A)  Patient Location: PACU  Anesthesia Type: MAC  Level of Consciousness: awake and alert   Airway and Oxygen Therapy: Patient Spontanous Breathing  Post-op Pain: mild  Post-op Assessment: Post-op Vital signs reviewed, Patient's Cardiovascular Status Stable, Respiratory Function Stable, Patent Airway and No signs of Nausea or vomiting  Last Vitals:  Filed Vitals:   05/06/14 1250  BP: 126/73  Pulse: 60  Temp:   Resp: 15    Post-op Vital Signs: stable   Complications: No apparent anesthesia complications

## 2014-05-06 NOTE — Interval H&P Note (Signed)
History and Physical Interval Note:  05/06/2014 11:18 AM  Johnny [name]  has presented today for surgery, with the diagnosis of GERD  The various methods of treatment have been discussed with the patient and family. After consideration of risks, benefits and other options for treatment, the patient has consented to  Procedure(s): ESOPHAGOGASTRODUODENOSCOPY (EGD) WITH PROPOFOL (N/A) BRAVO PH STUDY (N/A) as a surgical intervention .  The patient's history has been reviewed, patient examined, no change in status, stable for surgery.  I have reviewed the patient's chart and labs.  Questions were answered to the patient's satisfaction.     [name]

## 2014-05-06 NOTE — Anesthesia Preprocedure Evaluation (Signed)
Anesthesia Evaluation  Patient identified by MRN, date of birth, ID band Patient awake    Reviewed: Allergy & Precautions, H&P , NPO status , Patient's Chart, lab work & pertinent test results  Airway Mallampati: II TM Distance: >3 FB Neck ROM: Full    Dental no notable dental hx.    Pulmonary neg pulmonary ROS, former smoker,  breath sounds clear to auscultation  Pulmonary exam normal       Cardiovascular negative cardio ROS  Rhythm:Regular Rate:Normal     Neuro/Psych negative neurological ROS  negative psych ROS   GI/Hepatic Neg liver ROS, GERD-  Medicated,  Endo/Other  negative endocrine ROS  Renal/GU negative Renal ROS  negative genitourinary   Musculoskeletal negative musculoskeletal ROS (+)   Abdominal   Peds negative pediatric ROS (+)  Hematology negative hematology ROS (+)   Anesthesia Other Findings   Reproductive/Obstetrics negative OB ROS                           Anesthesia Physical Anesthesia Plan  ASA: II  Anesthesia Plan: MAC   Post-op Pain Management:    Induction: Intravenous  Airway Management Planned: Nasal Cannula  Additional Equipment:   Intra-op Plan:   Post-operative Plan:   Informed Consent: I have reviewed the patients History and Physical, chart, labs and discussed the procedure including the risks, benefits and alternatives for the proposed anesthesia with the patient or authorized representative who has indicated his/her understanding and acceptance.   Dental advisory given  Plan Discussed with: CRNA and Surgeon  Anesthesia Plan Comments:         Anesthesia Quick Evaluation

## 2014-05-06 NOTE — Op Note (Signed)
[facility] [place], [id number]   ENDOSCOPY PROCEDURE REPORT  PATIENT: Johnny [name], Johnny [name]  MR#: [id number] BIRTHDATE: 12/31/1940 , 72  yrs. old GENDER: Male ENDOSCOPIST: [name], MD REFERRED BY:  Dr Precious Haws PROCEDURE DATE:  05/06/2014 PROCEDURE:  EGD w/ biopsy and EGD w/ Bravo capsule placement ASA CLASS:     Class II INDICATIONS:  chronic cough. 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  Treated for gastroesophageal reflux with Prevacid 15 mg twice a day and dexilant 60 mg hs. MEDICATIONS: MAC sedation, administered by CRNA TOPICAL ANESTHETIC: Cetacaine Spray  DESCRIPTION OF PROCEDURE: After the risks benefits and alternatives of the procedure were thoroughly explained, informed consent was obtained.  The Pentax Gastroscope [id number] endoscope was introduced through the mouth and advanced to the second portion of the duodenum. Without limitations.  The instrument was slowly withdrawn as the mucosa was fully examined.      Esophagus, proximal mid and distal esophageal mucosa appeared normal. The Z line was slightly irregular, Squamocolumnar junction was located at the level of the LES at 40 cm from the incisors. Biopsies were taken to rule out Barrett's esophagus. There was no evidence of esophagitis or stricture. There was no hiatal hernia. The endoscope traversed into the stomach without resistance Stomach: Gastric mucosa was normal through the body and the gastric antrum. Retroflexion of the endoscope revealed normal fundus and cardia. Gastric outlet was normal Duodenum: Descending duodenum and duodenal bulb were unremarkable [          The scope was then withdrawn from the patient and the procedure completed. Bravo probe was placed 6 cm proximal to the squamocolumnar junction at 34 cm from the incisors. The first probe failed  attach to the mucosa and was lost to the small bowel. Second probe was applied attached itself  successfully 6 cm  above the LES, video photographs were taken for documentation COMPLICATIONS: There were no complications. ENDOSCOPIC IMPRESSION:  essentially normal upper endoscopy of esophagus stomach and duodenum, slightly irregular squamocolumnar junction. Status post biopsies Successful placement of 48-hour Bravo probe 6 cm proximal to the squamocolumnar junction located at 40 cm  RECOMMENDATIONS: 1.  Await pathology results 2.  Anti-reflux regimen to be follow 3.  Continue PPI 4. Depending on the results of the probe will consider referral to voice  center in [place]: for EGD pending biopsy results.  eSigned:  [name], MD 05/06/2014 12:55 PM   CC:  PATIENT NAME:  Johnny [name], Johnny [name] MR#: [id number]

## 2014-05-07 ENCOUNTER — Encounter (HOSPITAL_COMMUNITY): Payer: Self-pay | Admitting: Internal Medicine

## 2014-05-07 ENCOUNTER — Encounter: Payer: Self-pay | Admitting: Internal Medicine

## 2014-05-10 ENCOUNTER — Telehealth: Payer: Self-pay | Admitting: *Deleted

## 2014-05-10 NOTE — Telephone Encounter (Signed)
Patient has been scheduled for an appointment with [facility], Dr Addison Bailey on Tuesday, June 9th @ 1:20 pm ([place] [place]... [facility] [employer]). I have advised patient of this and have also given him the phone number to the [facility] should he need to contact them. Information faxed to [facility].

## 2014-05-10 NOTE — Telephone Encounter (Signed)
Message copied by Larina Bras on Fri May 10, 2014 11:30 AM ------      Message from: [name]      Created: Fri May 10, 2014  8:37 AM      Regarding: appointment at the voice center- Dr Farrel Gordon  [place],I spoke to the pt about his 48 hr Bravo pprobe which was negative. He is still having cough and is interested in referral to voice center at [facility]. Please make that referral. I have a copy of his Bravo probe and will give it to him. Thanx DB ------

## 2014-05-14 DIAGNOSIS — K219 Gastro-esophageal reflux disease without esophagitis: Secondary | ICD-10-CM

## 2014-05-14 DIAGNOSIS — R490 Dysphonia: Secondary | ICD-10-CM | POA: Insufficient documentation

## 2014-05-14 DIAGNOSIS — R053 Chronic cough: Secondary | ICD-10-CM | POA: Insufficient documentation

## 2014-05-14 HISTORY — DX: Gastro-esophageal reflux disease without esophagitis: K21.9

## 2014-05-14 HISTORY — DX: Dysphonia: R49.0

## 2014-05-23 ENCOUNTER — Other Ambulatory Visit: Payer: Self-pay | Admitting: Internal Medicine

## 2014-08-06 ENCOUNTER — Other Ambulatory Visit: Payer: Self-pay | Admitting: Urology

## 2014-08-09 ENCOUNTER — Other Ambulatory Visit: Payer: Self-pay | Admitting: Urology

## 2014-08-16 ENCOUNTER — Other Ambulatory Visit: Payer: Self-pay | Admitting: Urology

## 2014-08-20 MED ORDER — BUPIVACAINE HCL 0.5 % IJ SOLN: 50.0000 mL | Freq: Once | INTRAMUSCULAR | Status: AC

## 2014-09-23 DIAGNOSIS — J383 Other diseases of vocal cords: Secondary | ICD-10-CM

## 2014-09-23 HISTORY — DX: Other diseases of vocal cords: J38.3

## 2014-10-10 ENCOUNTER — Encounter (HOSPITAL_BASED_OUTPATIENT_CLINIC_OR_DEPARTMENT_OTHER): Payer: Self-pay | Admitting: *Deleted

## 2014-10-11 ENCOUNTER — Encounter (HOSPITAL_BASED_OUTPATIENT_CLINIC_OR_DEPARTMENT_OTHER): Payer: Self-pay | Admitting: *Deleted

## 2014-10-11 NOTE — Progress Notes (Signed)
NPO AFTER MN. ARRIVE AT 0715. NEEDS HG. WILL TAKE AM MEDS W/ SIPS OF WATER.

## 2014-10-14 ENCOUNTER — Ambulatory Visit (HOSPITAL_BASED_OUTPATIENT_CLINIC_OR_DEPARTMENT_OTHER): Payer: Medicare Other | Admitting: Anesthesiology

## 2014-10-14 ENCOUNTER — Encounter (HOSPITAL_BASED_OUTPATIENT_CLINIC_OR_DEPARTMENT_OTHER)
Admission: RE | Disposition: A | Discharge status: Home / Self Care | Payer: Self-pay | Source: Ambulatory Visit | Attending: Urology

## 2014-10-14 ENCOUNTER — Encounter (HOSPITAL_BASED_OUTPATIENT_CLINIC_OR_DEPARTMENT_OTHER): Payer: Self-pay | Admitting: Anesthesiology

## 2014-10-14 ENCOUNTER — Ambulatory Visit (HOSPITAL_BASED_OUTPATIENT_CLINIC_OR_DEPARTMENT_OTHER)
Admission: RE | Admit: 2014-10-14 | Discharge: 2014-10-14 | Disposition: A | Discharge status: Home / Self Care | Payer: Medicare Other | Source: Ambulatory Visit | Attending: Urology | Admitting: Urology

## 2014-10-14 DIAGNOSIS — N503 Cyst of epididymis: Secondary | ICD-10-CM | POA: Insufficient documentation

## 2014-10-14 DIAGNOSIS — N433 Hydrocele, unspecified: Secondary | ICD-10-CM | POA: Diagnosis not present

## 2014-10-14 DIAGNOSIS — Z8546 Personal history of malignant neoplasm of prostate: Secondary | ICD-10-CM | POA: Diagnosis not present

## 2014-10-14 DIAGNOSIS — E780 Pure hypercholesterolemia: Secondary | ICD-10-CM | POA: Insufficient documentation

## 2014-10-14 DIAGNOSIS — Z7982 Long term (current) use of aspirin: Secondary | ICD-10-CM | POA: Insufficient documentation

## 2014-10-14 DIAGNOSIS — K219 Gastro-esophageal reflux disease without esophagitis: Secondary | ICD-10-CM | POA: Diagnosis not present

## 2014-10-14 DIAGNOSIS — Z87891 Personal history of nicotine dependence: Secondary | ICD-10-CM | POA: Diagnosis not present

## 2014-10-14 HISTORY — PX: HYDROCELE EXCISION: SHX482

## 2014-10-14 HISTORY — DX: Hydrocele, unspecified: N43.3

## 2014-10-14 LAB — POCT HEMOGLOBIN-HEMACUE: Hemoglobin: 13.8 g/dL (ref 13.0–17.0)

## 2014-10-14 SURGERY — HYDROCELECTOMY
Anesthesia: General | Site: Scrotum | Laterality: Left

## 2014-10-14 MED ORDER — PROPOFOL 10 MG/ML IV BOLUS
INTRAVENOUS | Status: DC | PRN
  Administered 2014-10-14: 170 mg via INTRAVENOUS

## 2014-10-14 MED ORDER — ONDANSETRON HCL 4 MG/2ML IJ SOLN
INTRAMUSCULAR | Status: DC | PRN
  Administered 2014-10-14: 4 mg via INTRAVENOUS

## 2014-10-14 MED ORDER — LIDOCAINE HCL (CARDIAC) 20 MG/ML IV SOLN
INTRAVENOUS | Status: DC | PRN
  Administered 2014-10-14: 60 mg via INTRAVENOUS

## 2014-10-14 MED ORDER — ACETAMINOPHEN 10 MG/ML IV SOLN
INTRAVENOUS | Status: DC | PRN
  Administered 2014-10-14: 1000 mg via INTRAVENOUS

## 2014-10-14 MED ORDER — BUPIVACAINE HCL (PF) 0.25 % IJ SOLN
INTRAMUSCULAR | Status: DC | PRN
  Administered 2014-10-14: 5 mL

## 2014-10-14 MED ORDER — OXYCODONE HCL 5 MG/5ML PO SOLN
5.0000 mg | Freq: Once | ORAL | Status: DC | PRN
  Filled 2014-10-14: qty 5

## 2014-10-14 MED ORDER — CEFAZOLIN SODIUM-DEXTROSE 2-3 GM-% IV SOLR
INTRAVENOUS | Status: AC
  Filled 2014-10-14: qty 50

## 2014-10-14 MED ORDER — LIDOCAINE HCL (PF) 1 % IJ SOLN
INTRAMUSCULAR | Status: DC | PRN
  Administered 2014-10-14: 5 mL

## 2014-10-14 MED ORDER — DEXAMETHASONE SODIUM PHOSPHATE 4 MG/ML IJ SOLN
INTRAMUSCULAR | Status: DC | PRN
  Administered 2014-10-14: 8 mg via INTRAVENOUS

## 2014-10-14 MED ORDER — FENTANYL CITRATE 0.05 MG/ML IJ SOLN
INTRAMUSCULAR | Status: AC
  Filled 2014-10-14: qty 6

## 2014-10-14 MED ORDER — MEPERIDINE HCL 25 MG/ML IJ SOLN
6.2500 mg | INTRAMUSCULAR | Status: DC | PRN
Start: 2014-10-14 — End: 2014-10-14
  Filled 2014-10-14: qty 1

## 2014-10-14 MED ORDER — FENTANYL CITRATE 0.05 MG/ML IJ SOLN
INTRAMUSCULAR | Status: DC | PRN
  Administered 2014-10-14 (×4): 25 ug via INTRAVENOUS

## 2014-10-14 MED ORDER — GLYCOPYRROLATE 0.2 MG/ML IJ SOLN
INTRAMUSCULAR | Status: DC | PRN
  Administered 2014-10-14: 0.2 mg via INTRAVENOUS

## 2014-10-14 MED ORDER — CEFAZOLIN SODIUM-DEXTROSE 2-3 GM-% IV SOLR
2.0000 g | INTRAVENOUS | Status: AC
  Administered 2014-10-14: 2 g via INTRAVENOUS
  Filled 2014-10-14: qty 50

## 2014-10-14 MED ORDER — HYDROMORPHONE HCL 1 MG/ML IJ SOLN
0.2500 mg | INTRAMUSCULAR | Status: DC | PRN
  Filled 2014-10-14: qty 1

## 2014-10-14 MED ORDER — STERILE WATER FOR IRRIGATION IR SOLN
Status: DC | PRN
  Administered 2014-10-14: 500 mL

## 2014-10-14 MED ORDER — OXYCODONE HCL 5 MG PO TABS
5.0000 mg | ORAL_TABLET | Freq: Once | ORAL | Status: DC | PRN
  Filled 2014-10-14: qty 1

## 2014-10-14 MED ORDER — KETOROLAC TROMETHAMINE 30 MG/ML IJ SOLN
INTRAMUSCULAR | Status: DC | PRN
  Administered 2014-10-14: 30 mg via INTRAVENOUS

## 2014-10-14 MED ORDER — EPHEDRINE SULFATE 50 MG/ML IJ SOLN
INTRAMUSCULAR | Status: DC | PRN
  Administered 2014-10-14 (×2): 10 mg via INTRAVENOUS

## 2014-10-14 MED ORDER — PROMETHAZINE HCL 25 MG/ML IJ SOLN
6.2500 mg | INTRAMUSCULAR | Status: DC | PRN
  Filled 2014-10-14: qty 1

## 2014-10-14 MED ORDER — CEPHALEXIN 500 MG PO CAPS: 500.0000 mg | ORAL_CAPSULE | Freq: Four times a day (QID) | ORAL | Status: DC

## 2014-10-14 MED ORDER — LACTATED RINGERS IV SOLN
INTRAVENOUS | Status: DC
  Administered 2014-10-14 (×2): via INTRAVENOUS
  Filled 2014-10-14: qty 1000

## 2014-10-14 MED ORDER — SODIUM CHLORIDE 0.9 % IR SOLN
Status: DC | PRN
  Administered 2014-10-14: 500 mL

## 2014-10-14 MED ORDER — OXYCODONE-ACETAMINOPHEN 5-325 MG PO TABS: 1.0000 | ORAL_TABLET | ORAL | Status: DC | PRN

## 2014-10-14 SURGICAL SUPPLY — 40 items
BLADE CLIPPER SURG (BLADE) ×3 IMPLANT
BLADE SURG 15 STRL LF DISP TIS (BLADE) ×1 IMPLANT
BLADE SURG 15 STRL SS (BLADE) ×2
BNDG GAUZE ELAST 4 BULKY (GAUZE/BANDAGES/DRESSINGS) ×3 IMPLANT
BRIEF STRETCH FOR OB PAD LRG (UNDERPADS AND DIAPERS) ×3 IMPLANT
CANISTER SUCTION 1200CC (MISCELLANEOUS) IMPLANT
CANISTER SUCTION 2500CC (MISCELLANEOUS) ×3 IMPLANT
CLOTH BEACON ORANGE TIMEOUT ST (SAFETY) ×3 IMPLANT
COVER MAYO STAND STRL (DRAPES) ×3 IMPLANT
COVER TABLE BACK 60X90 (DRAPES) ×3 IMPLANT
DERMABOND ADVANCED (GAUZE/BANDAGES/DRESSINGS) ×2
DERMABOND ADVANCED .7 DNX12 (GAUZE/BANDAGES/DRESSINGS) ×1 IMPLANT
DISSECTOR ROUND CHERRY 3/8 STR (MISCELLANEOUS) ×3 IMPLANT
DRAIN PENROSE 18X1/4 LTX STRL (WOUND CARE) ×3 IMPLANT
DRAPE PED LAPAROTOMY (DRAPES) ×3 IMPLANT
ELECT NEEDLE TIP 2.8 STRL (NEEDLE) ×3 IMPLANT
ELECT REM PT RETURN 9FT ADLT (ELECTROSURGICAL) ×3
ELECTRODE REM PT RTRN 9FT ADLT (ELECTROSURGICAL) ×1 IMPLANT
GLOVE BIO SURGEON STRL SZ7.5 (GLOVE) ×3 IMPLANT
GLOVE BIOGEL PI IND STRL 7.5 (GLOVE) ×2 IMPLANT
GLOVE BIOGEL PI INDICATOR 7.5 (GLOVE) ×4
GLOVE SURG SS PI 7.5 STRL IVOR (GLOVE) ×3 IMPLANT
GOWN PREVENTION PLUS LG XLONG (DISPOSABLE) ×3 IMPLANT
GOWN STRL REUS W/TWL XL LVL3 (GOWN DISPOSABLE) ×6 IMPLANT
NEEDLE HYPO 25X1 1.5 SAFETY (NEEDLE) ×3 IMPLANT
NS IRRIG 500ML POUR BTL (IV SOLUTION) IMPLANT
PACK BASIN DAY SURGERY FS (CUSTOM PROCEDURE TRAY) ×3 IMPLANT
PAD PREP 24X48 CUFFED NSTRL (MISCELLANEOUS) ×3 IMPLANT
PENCIL BUTTON HOLSTER BLD 10FT (ELECTRODE) ×3 IMPLANT
SUT VIC AB 3-0 SH 27 (SUTURE) ×8
SUT VIC AB 3-0 SH 27X BRD (SUTURE) ×4 IMPLANT
SUT VIC AB 3-0 X1 27 (SUTURE) IMPLANT
SUT VICRYL 4-0 PS2 18IN ABS (SUTURE) ×3 IMPLANT
SYR 50ML LL SCALE MARK (SYRINGE) IMPLANT
SYR CONTROL 10ML LL (SYRINGE) ×3 IMPLANT
TRAY DSU PREP LF (CUSTOM PROCEDURE TRAY) ×3 IMPLANT
TUBE CONNECTING 12'X1/4 (SUCTIONS) ×1
TUBE CONNECTING 12X1/4 (SUCTIONS) ×2 IMPLANT
WATER STERILE IRR 500ML POUR (IV SOLUTION) ×3 IMPLANT
YANKAUER SUCT BULB TIP NO VENT (SUCTIONS) ×3 IMPLANT

## 2014-10-14 NOTE — Discharge Instructions (Signed)
Discharge instructions following scrotal surgery  Call your doctor for:  Fever is greater than 100.5  Severe nausea or vomiting  Increasing pain not controlled by pain medication  Increasing redness or drainage from incisions  The number for questions or concerns is [phone]  Activity level: No lifting greater than 10 pounds (about equal to milk) for the next 2 weeks or until cleared to do so at follow-up appointment.  Otherwise activity as tolerated by comfort level.  Diet: May resume your regular diet as tolerated  Driving: No driving while still taking opiate pain medications (weight at least 6-8 hours after last dose).  No driving if you still sore from surgery as it may limit her ability to react quickly if necessary.   Shower/bath: May shower and get incision wet pad dry immediately following.  Do not scrub vigorously for the next 2-3 weeks.  Do not soak incision (ID soaking in bath or swimming) until told he may do so by Dr. Gaynelle Arabian, as this may promote a wound infection.  Wound care: He may cover wounds with sterile gauze as needed to prevent incisions rubbing on close follow-up in any seepage.  Where tight fitting underpants for at least 2 weeks.  He should apply cold compresses (ice or sac of frozen peas/corn) to your scrotum for at least 48 hours to reduce the swelling.  You should expect that his scrotum will swell up initially and then get smaller over the next 2-4 weeks.  Drain will be removed in two days in clinic.  Please call the office today or tomorrow to request a  Post Anesthesia Home Care Instructions  Activity: Get plenty of rest for the remainder of the day. A responsible adult should stay with you for 24 hours following the procedure.  For the next 24 hours, DO NOT: -Drive a car -Paediatric nurse -Drink alcoholic beverages -Take any medication unless instructed by your physician -Make any legal decisions or sign important  papers.  Meals: Start with liquid foods such as gelatin or soup. Progress to regular foods as tolerated. Avoid greasy, spicy, heavy foods. If nausea and/or vomiting occur, drink only clear liquids until the nausea and/or vomiting subsides. Call your physician if vomiting continues.  Special Instructions/Symptoms: Your throat may feel dry or sore from the anesthesia or the breathing tube placed in your throat during surgery. If this causes discomfort, gargle with warm salt water. The discomfort should disappear within 24 hours. follow up appointment with Dr. Gaynelle Arabian on Wednesday.

## 2014-10-14 NOTE — Op Note (Signed)
Preoperative diagnosis:  1. Left epididymal head cyst  Postoperative diagnosis: 1. Same  Procedure(s): 1. Excision of left epididymal head cyst  Surgeon: Katrine Coho, MD  Resident: Langley Adie, MD  Anesthesia: General   Complications: None apparent  EBL: Minimal  Specimens: Cyst for pathology  Intraoperative findings: Left epididymal head cyst  Indication: 73 yo with symptomatic left epididymal head cyst who is status post prior scrotal surgery for cyst/hydrocele  Description of procedure:  Patient was induced with general anesthesia in the supine position. Scrotum was clipped and prepped and draped in the usual sterile fashion. Peri-operative antibiotics were provided. Final timeout called and information confirmed correct. We began by making a left sided hemiscrotal longitudinal incision along. The left testicle with large epididymal head cyst was delivered using a combination of sharp and electrocautery dissection. The cord was dissected off of the cyst and we were successful in preserving the cord structures. The cyst was dissected down to its attachment to the epididymis and then completely dissected free. The testicle appeared normal and viable. Hemostasis was confirmed and testicle returned to scrotum in the normal anatomic position. A penrose drain was placed prior to closure and secured to the scrotal skin.  Dartos was closed with running 3-0 vicryl and skin closed with running 4-0 vicryl.  Dressing was applied. Patient awoken and taken to PACU for recovery.

## 2014-10-14 NOTE — Transfer of Care (Signed)
Immediate Anesthesia Transfer of Care Note  Patient: Johnny [name]  Procedure(s) Performed: Procedure(s) (LRB): EPIDIDYMAL CYST EXCISION, LEFT (Left)  Patient Location: PACU  Anesthesia Type: General  Level of Consciousness: awake, alert  and oriented  Airway & Oxygen Therapy: Patient Spontanous Breathing and Patient connected to face mask oxygen  Post-op Assessment: Report given to PACU RN and Post -op Vital signs reviewed and stable  Post vital signs: Reviewed and stable  Complications: No apparent anesthesia complications

## 2014-10-14 NOTE — Interval H&P Note (Signed)
History and Physical Interval Note:  10/14/2014 10:45 AM  Johnny [name]  has presented today for surgery, with the diagnosis of Scrotal Hydrocele  The various methods of treatment have been discussed with the patient and family. After consideration of risks, benefits and other options for treatment, the patient has consented to  Procedure(s): EPIDIDYMAL CYST EXCISION, LEFT (Left) as a surgical intervention .  The patient's history has been reviewed, patient examined, no change in status, stable for surgery.  I have reviewed the patient's chart and labs.  Questions were answered to the patient's satisfaction.     Carolan Clines I

## 2014-10-14 NOTE — Anesthesia Procedure Notes (Signed)
Procedure Name: LMA Insertion Date/Time: 10/14/2014 9:10 AM Performed by: Justice Rocher Pre-anesthesia Checklist: Patient identified, Emergency Drugs available, Suction available and Patient being monitored Patient Re-evaluated:Patient Re-evaluated prior to inductionOxygen Delivery Method: Circle System Utilized Preoxygenation: Pre-oxygenation with 100% oxygen Intubation Type: IV induction Ventilation: Mask ventilation without difficulty LMA: LMA inserted LMA Size: 5.0 Number of attempts: 1 Airway Equipment and Method: bite block Placement Confirmation: positive ETCO2 Tube secured with: Tape Dental Injury: Teeth and Oropharynx as per pre-operative assessment

## 2014-10-14 NOTE — H&P (Signed)
Reason For Visit 7 mo f/u   Active Problems Problems  1. 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 Prostate cancer (185)   Assessed By: Carolan Clines (Urology); Last Assessed: 29 Jul 2014  History of Present Illness     73 YO [name] returns today for a 7 mo f/u. He is s/p (R) hydrocelectomy & excision of Rt epididymal cyst 10/15/13. He is doing great from surgery.       Hx of prostate cancer s/p a bilateral nerve-sparing RLRP on January 01, 2008. His PSA on 12/11/12 was < 0.01, undetectable since treatment.     Note: wife dies 2ndary vascuar dementia.   TNM Stage: pT2c Nx Mx  Gleason Score: 3+4=7  Surgical margins: Negative  Pretreatment PSA: 4.58  Pretreatment SHIM: 21    Now 5 years out from his radical prostatectomy. He continues to maintain excellent continence and denies any new voiding complaints. He is continued to find Levitra 20 mg very satisfactory for treatment of his erectile dysfunction.   Past Medical History Problems  1. History of esophageal reflux (V12.79) 2. History of hypercholesterolemia (V12.29) 3. 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 History of Pilonidal Cyst Resection 2. History of Primary Repair Of Ruptured Achilles Tendon 3. History of Prostatect Retropubic Radical W/ Nerve Sparing Laparoscopic 4. History of Pyeloplasty Robotic-Assisted 5. History of Surgery Epididymis Excision Of Local Lesion Right 6. History of Surgery Spermatic Cord Excision Of Hydrocele 7. History of Surgery Tunica Vaginalis Excision Of Hydrocele Right  Current Meds 1. Adult Aspirin Low Strength 81 MG TBDP;  Therapy: (Recorded:25Aug2010) to Recorded 2. Atorvastatin Calcium 20 MG Oral Tablet;  Therapy: (Recorded:20Aug2014) to  Recorded 3. BL Garlic TABS;  Therapy: (Recorded:25Aug2010) to Recorded 4. Calcium + D TABS;  Therapy: (Recorded:25Aug2010) to Recorded 5. Daily Multiple Vitamins TABS;  Therapy: (Recorded:25Aug2010) to Recorded 6. Fish Oil CAPS;  Therapy: (Recorded:25Aug2010) to Recorded 7. Fluticasone Propionate 50 MCG/ACT Nasal Suspension;  Therapy: 28Mar2012 to Recorded 8. Folic Acid CAPS;  Therapy: (Recorded:25Aug2010) to Recorded 9. Ketoconazole 2 % External Cream;  Therapy: [id number] to Recorded 10. Levitra 20 MG Oral Tablet; Take as directed;   Therapy: [id number] to (Last Rx:11Jan2013)  Requested for: [id number]   Ordered 11. Prevacid PACK;   Therapy: (Recorded:20Aug2014) to Recorded 12. Sildenafil Citrate 20 MG Oral Tablet; Take 2-3 tabs po as needed for sexual   activity;   Therapy: [id number] to (Last Rx:14Jan2015)  Requested for: [id number]   Ordered 13. Viagra 100 MG Oral Tablet; TAKE 1 TABLET As Directed;   Therapy: [id number] to (Last Rx:09Jan2014)  Requested for: [id number]   Ordered 14. ZyrTEC Allergy TABS;   Therapy: (Recorded:12Aug2009) to Recorded  Allergies Medication  1. No Known Drug Allergies  Family History Problems  1. Family history of Congestive Heart Failure : Mother 2. Denied: Family history of Prostate Cancer 3. Family history of Pulmonary Embolism : Father  Social History Problems  1. Alcohol Use   2 drinks/day 2. Former Smoker   1 ppd x 15 yrs, quit 28 yrs ago 3. Marital History - Currently Married 4. Occupation:   retired  Review of Systems Genitourinary, constitutional, skin, eye, otolaryngeal, hematologic/lymphatic, cardiovascular, pulmonary, endocrine, musculoskeletal, gastrointestinal, neurological and psychiatric system(s) were reviewed and pertinent findings if present are  noted.  Genitourinary: erectile dysfunction.    Vitals Vital Signs [Data Includes: Last 1 Day]  Recorded: 24Aug2015 02:54PM  Blood Pressure: 123 / 65 Temperature: 97.8  F Heart Rate: 73  Physical Exam Constitutional: Well nourished and well developed . No acute distress.  ENT:. The ears and nose are normal in appearance.  Pulmonary: No respiratory distress and normal respiratory rhythm and effort.  Cardiovascular: Heart rate and rhythm are normal . No peripheral edema.  Abdomen: The abdomen is soft and nontender. No masses are palpated. No CVA tenderness. No hernias are palpable. No hepatosplenomegaly noted.  Genitourinary: The penis is circumcised. The scrotum is normal in appearance. Examination of the right scrotum demonstrates no mass. Examination of the left scrotum demostrates a 4cm cm mass. The right epididymis is non-tender. The left epididymis is non-tender. The right testis is palpably normal and non-tender. The left testis is normal and non-tender.  Lymphatics: The femoral and inguinal nodes are not enlarged or tender.  Skin: Normal skin turgor, no visible rash and no visible skin lesions.  Neuro/Psych:. Mood and affect are appropriate.    Results/Data Urine [Data Includes: Last 1 Day]   24Aug2015  COLOR YELLOW   APPEARANCE CLEAR   SPECIFIC GRAVITY 1.010   pH 6.5   GLUCOSE NEG mg/dL  BILIRUBIN NEG   KETONE NEG mg/dL  BLOOD NEG   PROTEIN NEG mg/dL  UROBILINOGEN 1 mg/dL  NITRITE NEG   LEUKOCYTE ESTERASE NEG    Assessment Assessed  1. 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 psa is 0. He is waiting for his annual physical exam for his next psa ( Dr. Luciana Axe).   Scrotal hydrocele. Needs u/s. enlarging, and is beginning to cause problems with exercising. Will plan surgical exploration and excision/drainage.   Will not be able to sit to exercise for 6 weeks.   Plan Erectile dysfunction due to arterial insufficiency  1. Stop: Viagra 100 MG Oral Tablet Health Maintenance  2. UA With REFLEX; [Do Not Release]; Status:Resulted - Requires  Verification;   Done:  [id number] 02:21PM Hydrocele, right  3. SCROTAL U/S; Status:Hold For - Appointment,Date of Service; Requested  for:24Aug2015;  4. Follow-up Schedule Surgery Office  Follow-up  Status: Hold For -  Appointment  Requested for: 24Aug2015 Prostate cancer  5. Stop: Levitra 20 MG Oral Tablet  1. Scrotal u/s  2. Scrotal surgery for hydrocele drainage, as op.   Discussion/Summary cc: Dr. Precious Haws     Amendment  Scrotal ultrasound was accomplished in the supine position. The right testicle measures 5.10 cm x 2.237 years by 3.87 cm. The left testicle measures 4.85 cm x 3.17 cm x 3.68 cm. The tunica albuginea is normal bilaterally. On the right side, there is a 0.44 cm x 0.33 cm cyst noted, with through transmission. On the left side, is a 0.31 cm cyst with through transmission.    The right epididymis is normal. The left epididymis is not well seen, and there appears to be a left hydrocele, measuring 6.467 m x 4.96 cm x 5.86 cm with echoes present. There is no varicocele noted. The internal blood flow is normal. Both testicles.    This patient appears to have normal testicle, with a left hydrocele measuring 6.Johnny x 4.96 x 5.86 cm with internal echoes.1     1 Amended By: Carolan Clines; Aug 08 2014 11:06 AM EST  Signatures Electronically signed by : Carolan Clines, M.D.; Aug 08 2014 11:08AM EST  Reason For Visit 7 mo f/u   Active Problems Problems  1. 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 Prostate cancer (185)   Assessed By: Carolan Clines (Urology); Last Assessed: 29 Jul 2014  History of Present Illness     73 YO [name] returns today for a 7 mo f/u. He is s/p (R) hydrocelectomy & excision of Rt epididymal cyst 10/15/13. He is doing great from surgery.        Hx of prostate cancer s/p a bilateral nerve-sparing RLRP on January 01, 2008. His PSA on 12/11/12 was < 0.01, undetectable since treatment.     Note: wife dies 2ndary vascuar dementia.   TNM Stage: pT2c Nx Mx  Gleason Score: 3+4=7  Surgical margins: Negative  Pretreatment PSA: 4.58  Pretreatment SHIM: 21    Now 5 years out from his radical prostatectomy. He continues to maintain excellent continence and denies any new voiding complaints. He is continued to find Levitra 20 mg very satisfactory for treatment of his erectile dysfunction.   Past Medical History Problems  1. History of esophageal reflux (V12.79) 2. History of hypercholesterolemia (V12.29) 3. 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 History of Pilonidal Cyst Resection 2. History of Primary Repair Of Ruptured Achilles Tendon 3. History of Prostatect Retropubic Radical W/ Nerve Sparing Laparoscopic 4. History of Pyeloplasty Robotic-Assisted 5. History of Surgery Epididymis Excision Of Local Lesion Right 6. History of Surgery Spermatic Cord Excision Of Hydrocele 7. History of Surgery Tunica Vaginalis Excision Of Hydrocele Right  Current Meds 1. Adult Aspirin Low Strength 81 MG TBDP;  Therapy: (Recorded:25Aug2010) to Recorded 2. Atorvastatin Calcium 20 MG Oral Tablet;  Therapy: (Recorded:20Aug2014) to Recorded 3. BL Garlic TABS;  Therapy: (Recorded:25Aug2010) to Recorded 4. Calcium + D TABS;  Therapy: (Recorded:25Aug2010) to Recorded 5. Daily Multiple Vitamins TABS;  Therapy: (Recorded:25Aug2010) to Recorded 6. Fish Oil CAPS;  Therapy: (Recorded:25Aug2010) to Recorded 7. Fluticasone Propionate 50 MCG/ACT Nasal Suspension;  Therapy: 28Mar2012 to Recorded 8. Folic Acid CAPS;  Therapy: (Recorded:25Aug2010) to Recorded 9. Ketoconazole 2 % External Cream;  Therapy: [id number] to Recorded 10. Levitra 20 MG Oral Tablet; Take as directed;   Therapy: [id number] to (Last Rx:11Jan2013)  Requested for:  [id number]   Ordered 11. Prevacid PACK;   Therapy: (Recorded:20Aug2014) to Recorded 12. Sildenafil Citrate 20 MG Oral Tablet; Take 2-3 tabs po as needed for sexual   activity;   Therapy: [id number] to (Last Rx:14Jan2015)  Requested for: [id number]   Ordered 13. Viagra 100 MG Oral Tablet; TAKE 1 TABLET As Directed;   Therapy: [id number] to (Last Rx:09Jan2014)  Requested for: [id number]   Ordered 14. ZyrTEC Allergy TABS;   Therapy: (Recorded:12Aug2009) to Recorded  Allergies Medication  1. No Known Drug Allergies  Family History Problems  1. Family history of Congestive Heart Failure : Mother 2. Denied: Family history of Prostate Cancer 3. Family history of Pulmonary Embolism : Father  Social History Problems  1. Alcohol Use   2 drinks/day 2. Former Smoker   1 ppd x 15 yrs, quit 28 yrs ago 3. Marital History - Currently Married 4. Occupation:   retired  Review of Systems Genitourinary, constitutional, skin, eye, otolaryngeal, hematologic/lymphatic, cardiovascular, pulmonary, endocrine, musculoskeletal, gastrointestinal, neurological and psychiatric system(s) were reviewed and  pertinent findings if present are noted.  Genitourinary: erectile dysfunction.    Vitals Vital Signs [Data Includes: Last 1 Day]  Recorded: 24Aug2015 02:54PM  Blood Pressure: 123 / 65 Temperature: 97.8 F Heart Rate: 73  Physical Exam Constitutional: Well nourished and well developed . No acute distress.  ENT:. The ears and nose are normal in appearance.  Pulmonary: No respiratory distress and normal respiratory rhythm and effort.  Cardiovascular: Heart rate and rhythm are normal . No peripheral edema.  Abdomen: The abdomen is soft and nontender. No masses are palpated. No CVA tenderness. No hernias are palpable. No hepatosplenomegaly noted.  Genitourinary: The penis is circumcised. The scrotum is normal in appearance. Examination of the right scrotum demonstrates no mass. Examination of the left  scrotum demostrates a 4cm cm mass. The right epididymis is non-tender. The left epididymis is non-tender. The right testis is palpably normal and non-tender. The left testis is normal and non-tender.  Lymphatics: The femoral and inguinal nodes are not enlarged or tender.  Skin: Normal skin turgor, no visible rash and no visible skin lesions.  Neuro/Psych:. Mood and affect are appropriate.    Results/Data Urine [Data Includes: Last 1 Day]   24Aug2015  COLOR YELLOW   APPEARANCE CLEAR   SPECIFIC GRAVITY 1.010   pH 6.5   GLUCOSE NEG mg/dL  BILIRUBIN NEG   KETONE NEG mg/dL  BLOOD NEG   PROTEIN NEG mg/dL  UROBILINOGEN 1 mg/dL  NITRITE NEG   LEUKOCYTE ESTERASE NEG    Assessment Assessed  1. 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 psa is 0. He is waiting for his annual physical exam for his next psa ( Dr. Luciana Axe).   Scrotal hydrocele. Needs u/s. enlarging, and is beginning to cause problems with exercising. Will plan surgical exploration and excision/drainage.   Will not be able to sit to exercise for 6 weeks.   Plan Erectile dysfunction due to arterial insufficiency  1. Stop: Viagra 100 MG Oral Tablet Health Maintenance  2. UA With REFLEX; [Do Not Release]; Status:Resulted - Requires  Verification;   Done: [id number] 02:21PM Hydrocele, right  3. SCROTAL U/S; Status:Hold For - Appointment,Date of Service; Requested  for:24Aug2015;  4. Follow-up Schedule Surgery Office  Follow-up  Status: Hold For -  Appointment  Requested for: 24Aug2015 Prostate cancer  5. Stop: Levitra 20 MG Oral Tablet  1. Scrotal u/s  2. Scrotal surgery for hydrocele drainage, as op.   Discussion/Summary cc: Dr. Precious Haws     Amendment  Scrotal ultrasound was accomplished in the supine position. The right testicle measures 5.10 cm x 2.237 years by 3.87 cm. The left testicle measures 4.85 cm x 3.17 cm x 3.68 cm. The tunica  albuginea is normal bilaterally. On the right side, there is a 0.44 cm x 0.33 cm cyst noted, with through transmission. On the left side, is a 0.31 cm cyst with through transmission.    The right epididymis is normal. The left epididymis is not well seen, and there appears to be a left hydrocele, measuring 6.467 m x 4.96 cm x 5.86 cm with echoes present. There is no varicocele noted. The internal blood flow is normal. Both testicles.    This patient appears to have normal testicle, with a left hydrocele measuring 6.Johnny x 4.96 x 5.86 cm with internal echoes.1     1 Amended By: Carolan Clines; Aug 08 2014 11:06 AM EST  Signatures Electronically signed by : Pierre Bali  Gaynelle Arabian, M.D.; Aug 08 2014 11:08AM EST

## 2014-10-14 NOTE — Anesthesia Preprocedure Evaluation (Signed)
Anesthesia Evaluation  Patient identified by MRN, date of birth, ID band Patient awake    Reviewed: Allergy & Precautions, H&P , NPO status , Patient's Chart, lab work & pertinent test results  Airway Mallampati: II  TM Distance: >3 FB Neck ROM: Full    Dental no notable dental hx.    Pulmonary former smoker,  breath sounds clear to auscultation  Pulmonary exam normal       Cardiovascular negative cardio ROS  Rhythm:Regular Rate:Normal     Neuro/Psych negative neurological ROS  negative psych ROS   GI/Hepatic Neg liver ROS, GERD-  Medicated,  Endo/Other  negative endocrine ROS  Renal/GU negative Renal ROS     Musculoskeletal negative musculoskeletal ROS (+)   Abdominal   Peds  Hematology negative hematology ROS (+)   Anesthesia Other Findings   Reproductive/Obstetrics                             Anesthesia Physical  Anesthesia Plan  ASA: II  Anesthesia Plan: General   Post-op Pain Management:    Induction: Intravenous  Airway Management Planned: LMA  Additional Equipment: None  Intra-op Plan:   Post-operative Plan: Extubation in OR  Informed Consent: I have reviewed the patients History and Physical, chart, labs and discussed the procedure including the risks, benefits and alternatives for the proposed anesthesia with the patient or authorized representative who has indicated his/her understanding and acceptance.   Dental advisory given  Plan Discussed with: CRNA  Anesthesia Plan Comments:         Anesthesia Quick Evaluation

## 2014-10-14 NOTE — Anesthesia Postprocedure Evaluation (Signed)
Anesthesia Post Note  Patient: Johnny [name]  Procedure(s) Performed: Procedure(s) (LRB): EPIDIDYMAL CYST EXCISION, LEFT (Left)  Anesthesia type: General  Patient location: PACU  Post pain: Pain level controlled  Post assessment: Post-op Vital signs reviewed  Last Vitals: BP 143/76 mmHg  Pulse 70  Temp(Src) 36.3 C (Oral)  Resp 16  Ht 5\' 10"  (1.778 m)  Wt 160 lb 8 oz (72.802 kg)  BMI 23.03 kg/m2  SpO2 99%  Post vital signs: Reviewed  Level of consciousness: sedated  Complications: No apparent anesthesia complications  Pt. Without support person at home. Reminded patient of hospital policy that pt. Must have care giver for 24hrs post anesthesia. Dr. Gaynelle Arabian notified.

## 2014-10-15 ENCOUNTER — Encounter (HOSPITAL_BASED_OUTPATIENT_CLINIC_OR_DEPARTMENT_OTHER): Payer: Self-pay | Admitting: Urology

## 2014-10-23 ENCOUNTER — Encounter: Payer: Self-pay | Admitting: Neurology

## 2014-10-29 ENCOUNTER — Encounter: Payer: Self-pay | Admitting: Neurology

## 2014-12-23 DIAGNOSIS — M65341 Trigger finger, right ring finger: Secondary | ICD-10-CM | POA: Insufficient documentation

## 2014-12-23 HISTORY — DX: Trigger finger, right ring finger: M65.341

## 2015-04-12 IMAGING — RF DG ESOPHAGUS
17 of 21 series · 18 of 24 positions shown · non-contrast
Comparison: Pes radiographs 12/28/2007.

FLUOROSCOPY TIME:  2 min and 18 seconds.

CLINICAL DATA: 72-year-old male with unexplained persistent
coughing. Symptoms increased when the patient lays down or sits up.
Denies acid taste or heartburn.

EXAM:
ESOPHOGRAM / BARIUM SWALLOW / BARIUM TABLET STUDY
TECHNIQUE: Combined double contrast and single contrast examination performed
using effervescent crystals, thick barium liquid, and thin barium
liquid. The patient was observed with fluoroscopy swallowing a 13mm
barium sulphate tablet.

[Series 1: run · 1 of 1 slices shown (1 of 17)]
[im 1/1]
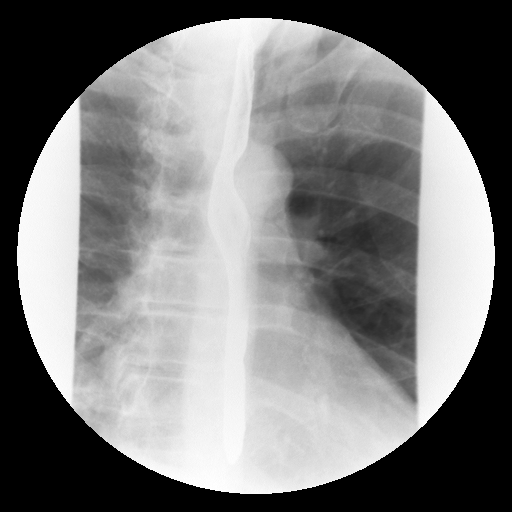

[Series 3: run · 1 of 1 slices shown (2 of 17)]
[im 1/1]
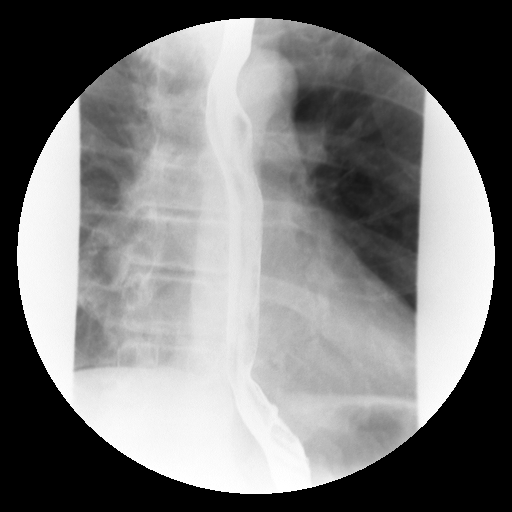

[Series 4: run · 1 of 1 slices shown (3 of 17)]
[im 1/1]
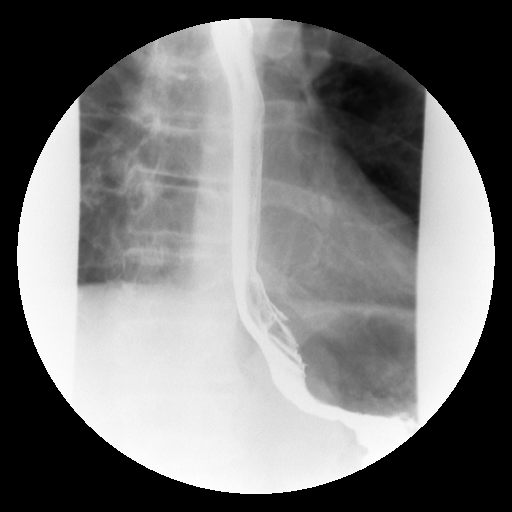

[Series 5: run · 1 of 1 slices shown (4 of 17)]
[im 1/1]
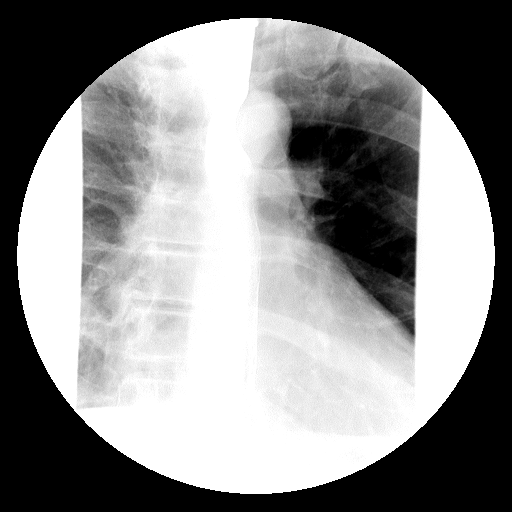

[Series 7: run · 1 of 1 slices shown (5 of 17)]
[im 1/1]
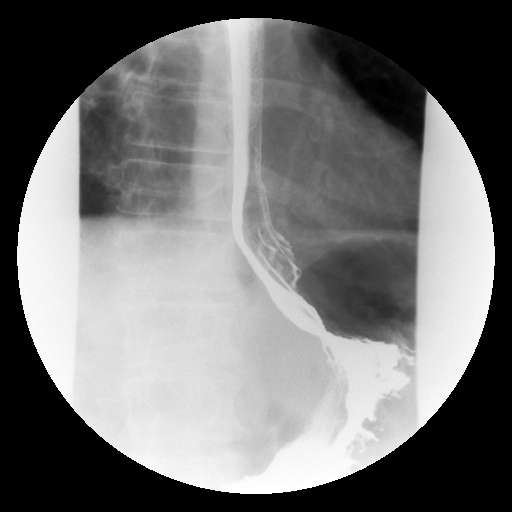

[Series 8: run · 1 of 1 slices shown (6 of 17)]
[im 1/1]
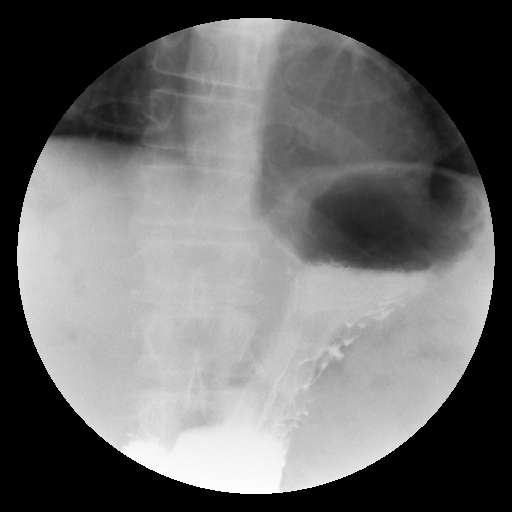

[Series 9: run · 1 of 6 slices shown (7 of 17)]
[im 1/6]
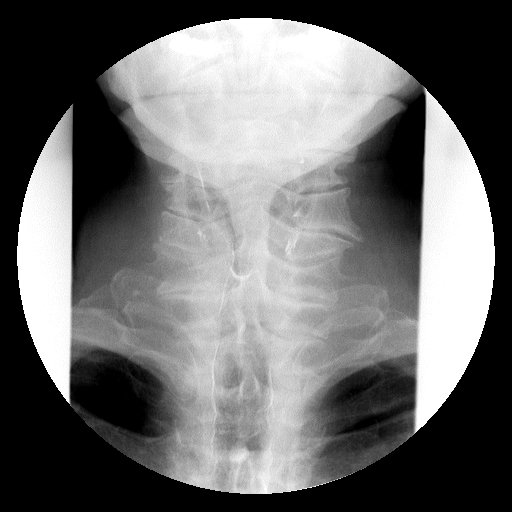

[Series 10: run · 1 of 1 slices shown (8 of 17)]
[im 1/1]
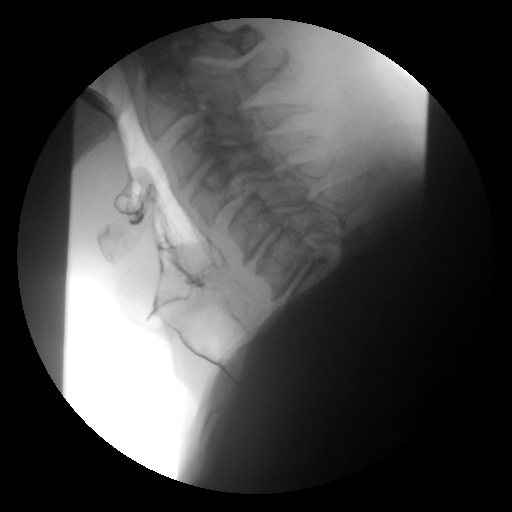

[Series 11: run · 2 of 6 slices shown (9 of 17)]
[im 1/6]
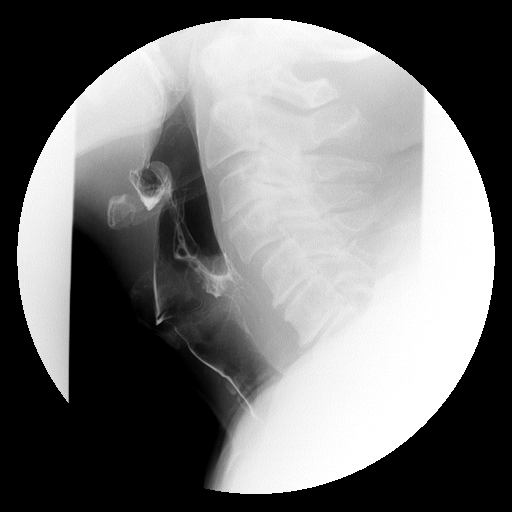
[im 3/6]
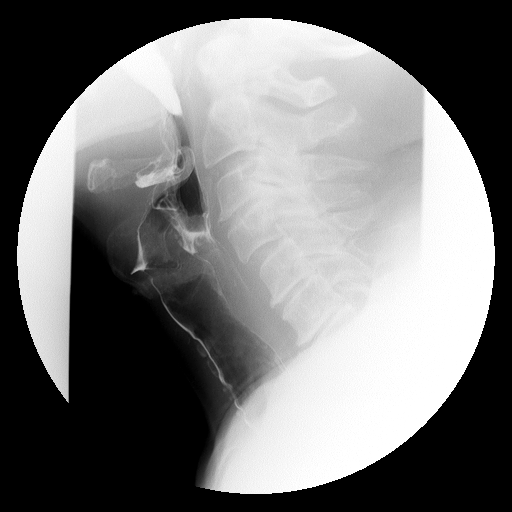

[Series 12: run · 1 of 1 slices shown (10 of 17)]
[im 1/1]
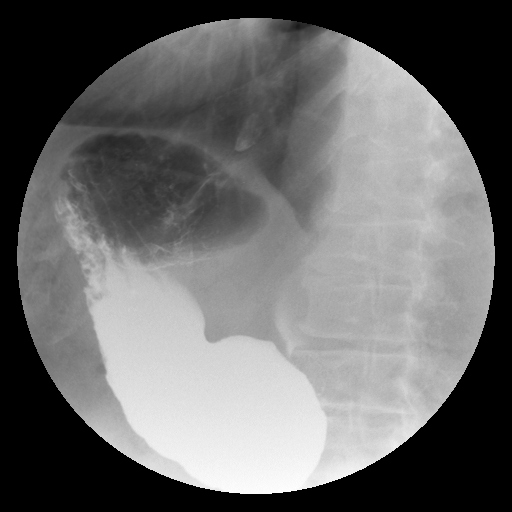

[Series 13: run · 1 of 1 slices shown (11 of 17)]
[im 1/1]
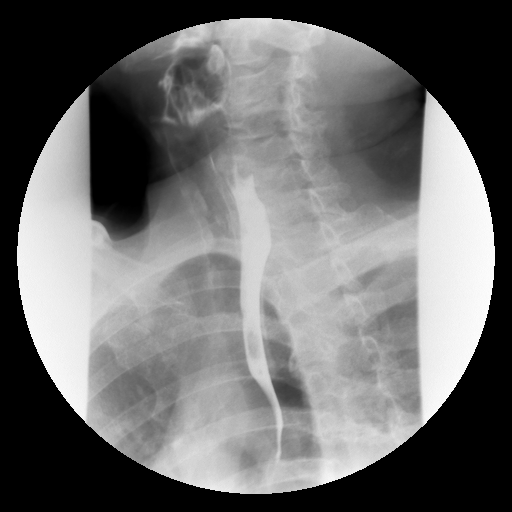

[Series 14: run · 1 of 1 slices shown (12 of 17)]
[im 1/1]
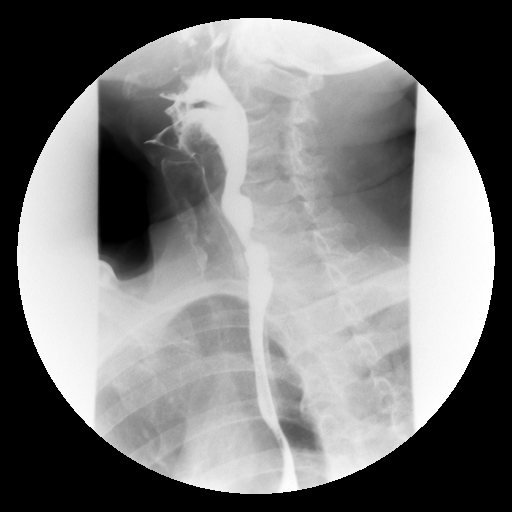

[Series 16: run · 1 of 1 slices shown (13 of 17)]
[im 1/1]
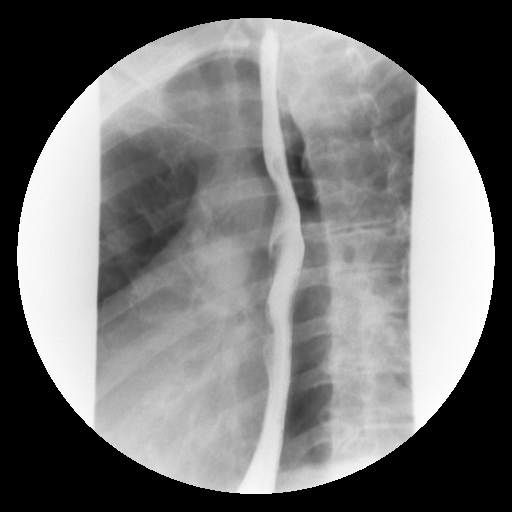

[Series 17: run · 1 of 1 slices shown (14 of 17)]
[im 1/1]
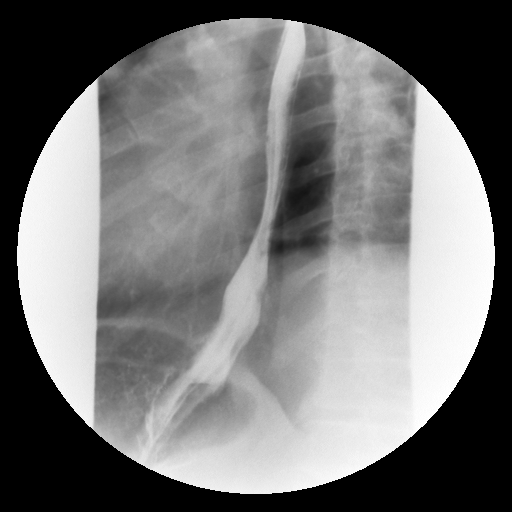

[Series 18: run · 1 of 1 slices shown (15 of 17)]
[im 1/1]
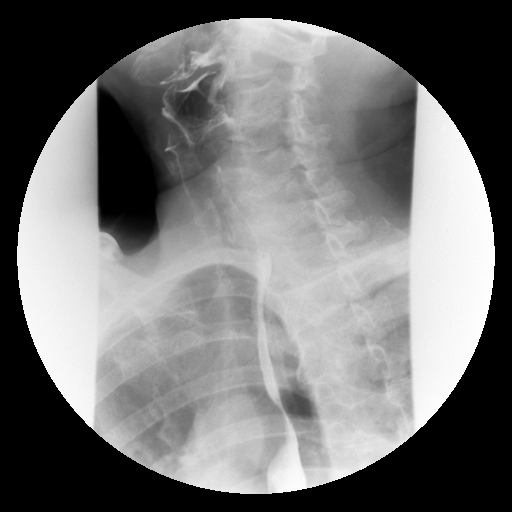

[Series 20: run · 1 of 1 slices shown (16 of 17)]
[im 1/1]
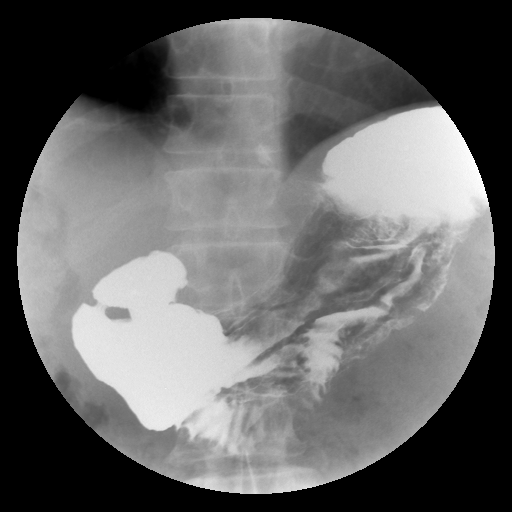

[Series 21: run · 1 of 1 slices shown (17 of 17)]
[im 1/1]
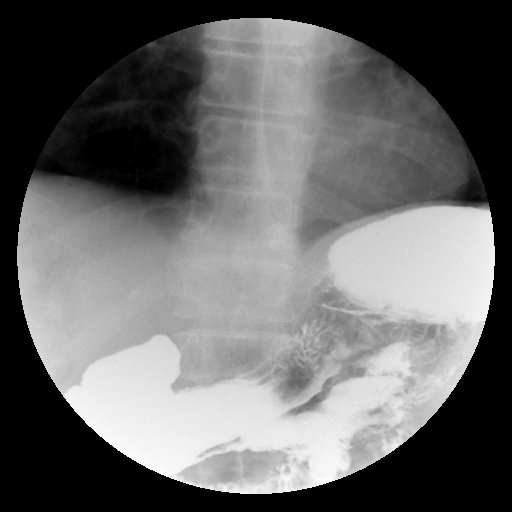

[18 of 24 positions shown; findings below may reference images not displayed]

FINDINGS: A double contrast study was undertaken and the patient tolerated
this well and without difficulty. No obstruction to the forward flow
of contrast throughout the esophagus and into the stomach. Normal
esophageal course and contour. Normal esophageal mucosal pattern.

The patient experienced coughing after ingesting the thick barium in
the first portion of the study.

A 12.5 mm barium tablet was administered and passed quickly into the
stomach without delay.

Rapid sequence images of the cervical esophagus were obtained. At
the beginning of these images contrast staining of the trachea is
noted (series 9, image 2). The cervical esophageal contour is within
normal limits. There is contrast staining of the ventral surface of
the trachea throughout the neck and extending into the chest (series
11 image 1). Following the contrast swallow on the lateral view
there is suggestion of contrast pooling near the vocal folds (series
11, image 6). Penetration of contrast noted with a swallow.

Esophageal motility is within normal limits for age. No tertiary
contractions observed. Normal gastroesophageal junction. No
gastroesophageal reflux occurred or was elicited. Prompt emptying of
contrast in the duodenum.
IMPRESSION: 1. Aspiration of barium over the course of this exam, some followed
by coughing and some silent. Suspect frequent/recurrent aspiration
is the etiology of the patient's coughing symptoms, and recommend
formal Speech Pathology evaluation.
2. Otherwise negative esophagram.

## 2015-04-12 IMAGING — CR DG SINUSES 1-2V
2 series · 2 of 2 positions shown · non-contrast
Comparison: None.

CLINICAL DATA: 72-year-old male with persisting cough and head
congestion. Initial encounter.

EXAM:
PARANASAL SINUSES - 1-2 VIEW

[view not recorded (1 of 2)]
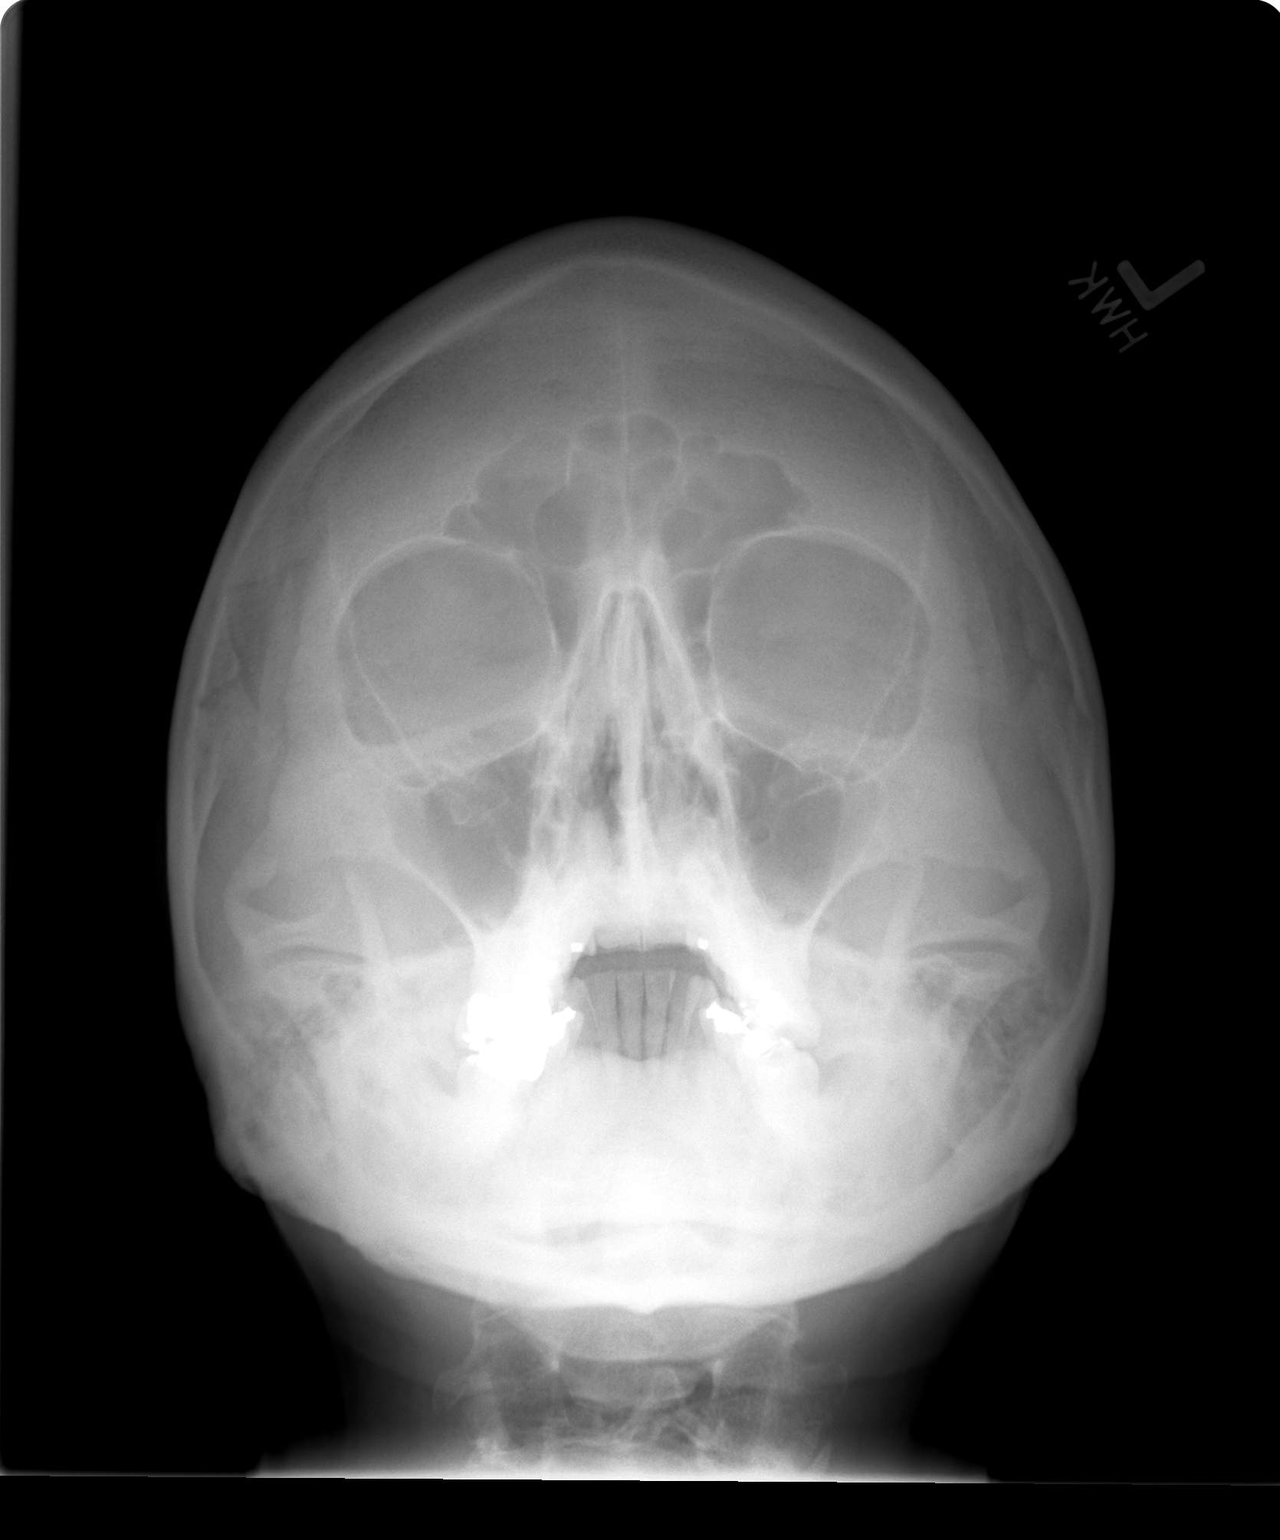

[view not recorded (2 of 2)]
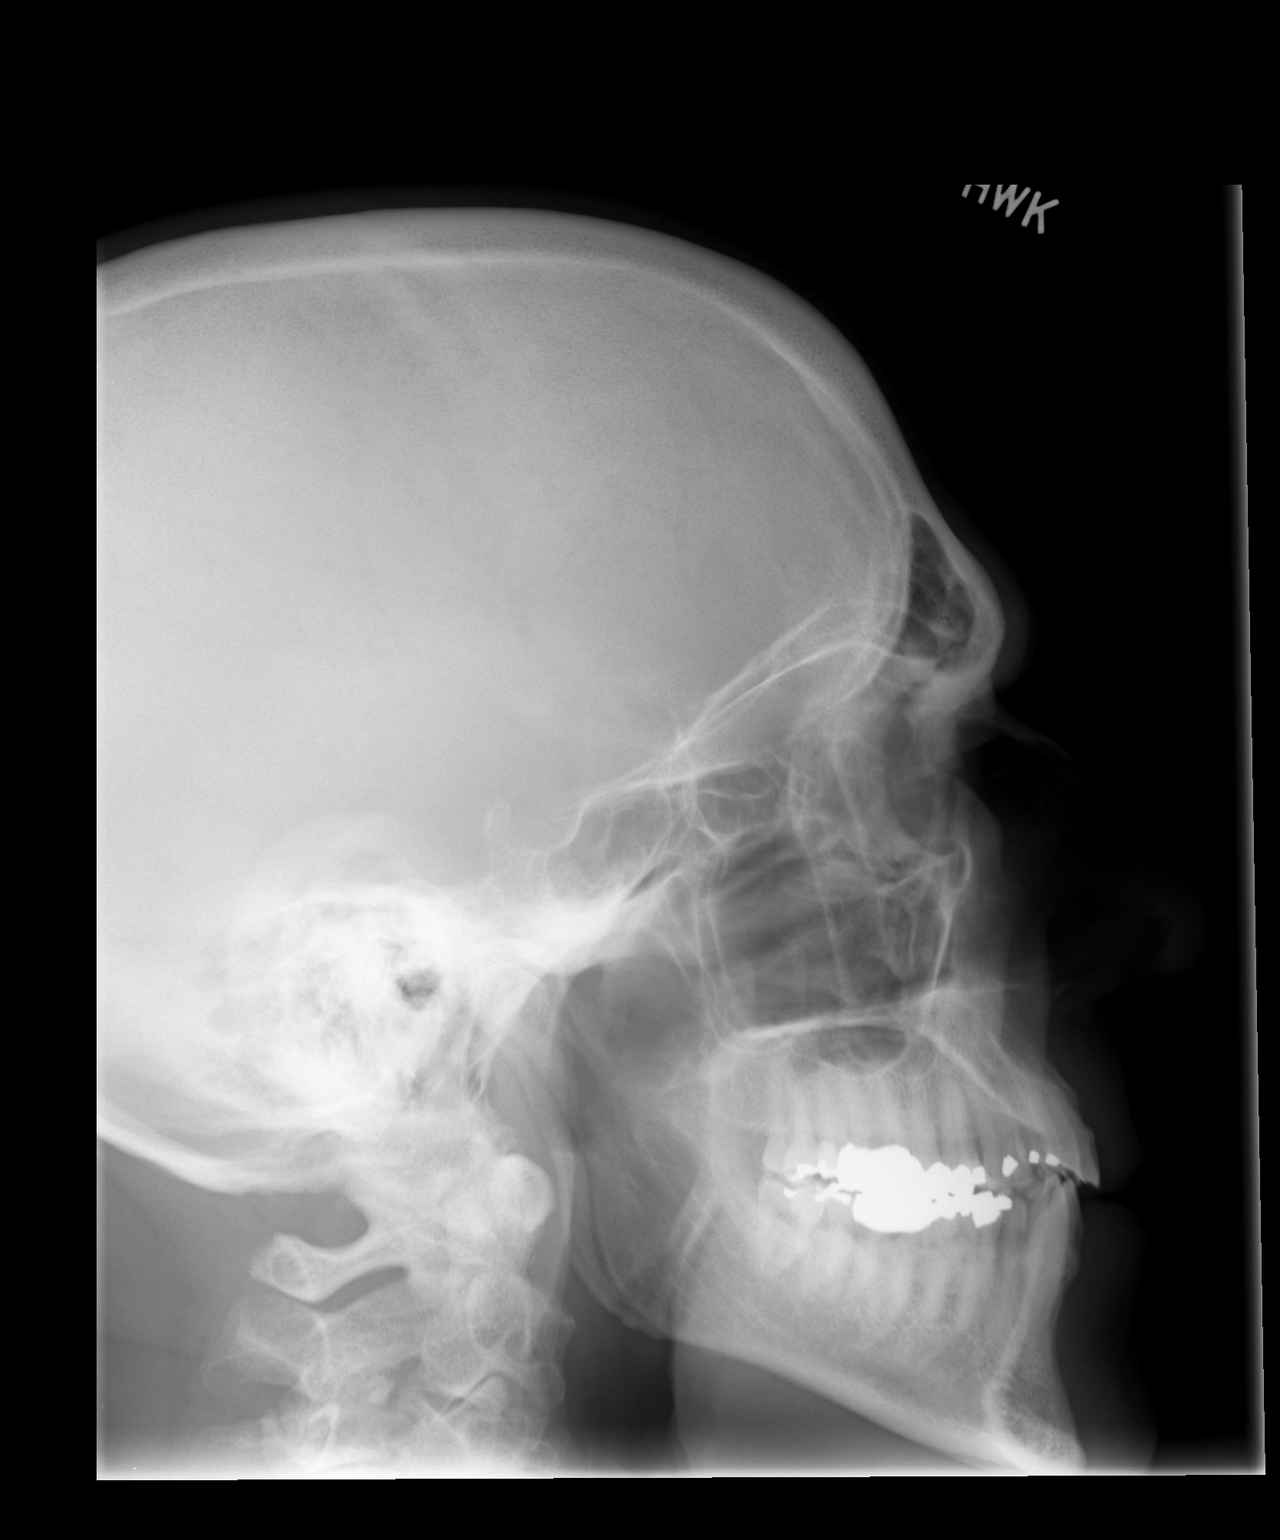

[2 of 2 positions shown; findings below may reference images not displayed]

FINDINGS: Bone mineralization is within normal limits. Symmetric and normal
pneumatization of the frontal, ethmoid and maxillary sinuses.
Sphenoid sinus aeration also appears normal on the lateral view.
Mastoid aeration appears within normal limits.
IMPRESSION: Negative radiographic sinus series.

## 2017-02-01 ENCOUNTER — Encounter: Payer: Self-pay | Admitting: Internal Medicine

## 2017-02-01 ENCOUNTER — Ambulatory Visit (INDEPENDENT_AMBULATORY_CARE_PROVIDER_SITE_OTHER): Payer: Medicare Other | Admitting: Internal Medicine

## 2017-02-01 VITALS — BP 140/80 | HR 77 | Ht 70.0 in | Wt 181.8 lb

## 2017-02-01 DIAGNOSIS — R058 Other specified cough: Secondary | ICD-10-CM | POA: Insufficient documentation

## 2017-02-01 DIAGNOSIS — R938 Abnormal findings on diagnostic imaging of other specified body structures: Secondary | ICD-10-CM | POA: Diagnosis not present

## 2017-02-01 DIAGNOSIS — R05 Cough: Secondary | ICD-10-CM | POA: Diagnosis not present

## 2017-02-01 DIAGNOSIS — R9389 Abnormal findings on diagnostic imaging of other specified body structures: Secondary | ICD-10-CM

## 2017-02-01 HISTORY — DX: Other specified cough: R05.8

## 2017-02-01 HISTORY — DX: Abnormal findings on diagnostic imaging of other specified body structures: R93.89

## 2017-02-01 MED ORDER — GABAPENTIN 300 MG PO CAPS: 300.0000 mg | ORAL_CAPSULE | Freq: Three times a day (TID) | ORAL | 2 refills | Status: DC

## 2017-02-01 NOTE — Assessment & Plan Note (Addendum)
DgEs 01/29/14 ? Asp > neg ST eval - Esophageal motility is within normal limits for age. No tertiary contractions observed. Normal gastroesophageal junction. No gastroesophageal reflux occurred or was elicited. Prompt emptying of contrast in the duodenum. Response to gabapentin documented by Dr Joya Gaskins / [facility] in 2016  - rechallenged with gabapentin 02/01/2017 > titrate up to 300 tid and add prn 1st gen H1  The most common causes of chronic cough in immunocompetent adults include the following: upper airway cough syndrome (UACS), previously referred to as postnasal drip syndrome (PNDS), which is caused by variety of rhinosinus conditions; (2) asthma; (3) GERD; (4) chronic bronchitis from cigarette smoking or other inhaled environmental irritants; (5) nonasthmatic eosinophilic bronchitis; and (6) bronchiectasis.   These conditions, singly or in combination, have accounted for up to 94% of the causes of chronic cough in prospective studies.   Other conditions have constituted no >6% of the causes in prospective studies These have included bronchogenic carcinoma, chronic interstitial pneumonia, sarcoidosis, left ventricular failure, ACEI-induced cough, and aspiration from a condition associated with pharyngeal dysfunction.    Chronic cough is often simultaneously caused by more than one condition. A single cause has been found from 38 to 82% of the time, multiple causes from 18 to 62%. Multiply caused cough has been the result of three diseases up to 42% of the time.       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   These are the same pts (now being labeled as having "irritable larynx syndrome" by some cough centers) who not infrequently have a history of having failed to tolerate ace inhibitors,  dry powder inhalers or biphosphonates or report having atypical/extraesophageal reflux symptoms that don't respond to standard doses of PPI  and are easily confused as having aecopd or asthma flares by even experienced allergists/ pulmonologists (myself included).   rec titrate up gabapentin to 300 tid then ov at 1 month to see if can taper rx for gerd or pnds  Total time devoted to counseling  > 50 % of initial 60 min office visit:  review case with pt/ discussion of options/alternatives/ personally creating written customized instructions  in presence of pt  then going over those specific  Instructions directly with the pt including how to use all of the meds but in particular covering each new medication in detail and the difference between the maintenance= "automatic" meds and the prns using an action plan format for the latter (If this problem/symptom => do that organization reading Left to right).  Please see AVS from this visit for a full list of these instructions which I personally wrote for this pt and  are unique to this visit.

## 2017-02-01 NOTE — Progress Notes (Signed)
Subjective:     Patient ID: Johnny [name], male   DOB: 1941-08-09,     MRN: [id number]  HPI  67 yowm quit smoking 1978 with cough x early 2000s referred to pulmonary clinic 02/01/2017 by Dr   Precious Haws   02/01/2017 1st [facility] office visit/ Leylany Nored   Chief Complaint  Patient presents with  . Pulmonary Consult    Referred by Dr. Precious Haws. Pt c/o cough x 15 yrs. Cough is prod, but he is unsure of sputum color. He states cough occurs when he sits up but not at all when he stands up. He coughs when he first lies down at night, but cough does not wake him up.   present present every day even on trips eg to the beach > neg allergy w/u /gerd rx s benefit  Simeon Craft / ENT  No findings Dr Joya Gaskins eval > gabapentin did nothing but note last ov on 02/03/15 suggested much improved on 200 mg tid so cut bak to 200 bid      Kouffman Reflux v Neurogenic Cough Differentiator Reflux Comments  Do you awaken from a sound sleep coughing violently?                            With trouble breathing? no   Do you have choking episodes when you cannot  Get enough air, gasping for air ?              No    Do you usually cough when you lie down into  The bed, or when you just lie down to rest ?                          Yes   Do you usually cough after meals or eating?         no   Do you cough when (or after) you bend over?    No   GERD SCORE     Kouffman Reflux v Neurogenic Cough Differentiator Neurogenic   Do you more-or-less cough all day long? sporadic   Does change of temperature make you cough? No    Does laughing or chuckling cause you to cough? No    Do fumes (perfume, automobile fumes, burned  Toast, etc.,) cause you to cough ?      no   Does speaking, singing, or talking on the phone cause you to cough   ?               No    Neurogenic/Airway score       Not limited by breathing from desired activities    No obvious day to day or daytime variability or assoc excess/ purulent sputum or  mucus plugs or hemoptysis or cp or chest tightness, subjective wheeze or overt sinus or hb symptoms. No unusual exp hx or h/o childhood pna/ asthma or knowledge of premature birth.  Sleeping ok without nocturnal  or early am exacerbation  of respiratory  c/o's or need for noct saba. Also denies any obvious fluctuation of symptoms with weather or environmental changes or other aggravating or alleviating factors except as outlined above   Current Medications, Allergies, Complete Past Medical History, Past Surgical History, Family History, and Social History were reviewed in [employer] record.  ROS  The following are not active complaints unless bolded sore throat, dysphagia, dental problems, itching, sneezing,  nasal congestion with sense of daytime pnds  or excess/ purulent secretions, ear ache,   fever, chills, sweats, unintended wt loss, classically pleuritic or exertional cp,  orthopnea pnd or leg swelling, presyncope, palpitations, abdominal pain, anorexia, nausea, vomiting, diarrhea  or change in bowel or bladder habits, change in stools or urine, dysuria,hematuria,  rash, arthralgias, visual complaints, headache, numbness, weakness or ataxia or problems with walking or coordination,  change in mood/affect or memory.           Review of Systems     Objective:   Physical Exam    amb wm nad    Wt Readings from Last 3 Encounters:  02/01/17 181 lb 12.8 oz (82.5 kg)  10/14/14 160 lb 8 oz (72.8 kg)  04/23/14 156 lb (70.8 kg)    Vital signs reviewed  - - Note on arrival 02 sats  98% on RA    HEENT: nl dentition, turbinates bilaterally, and oropharynx. Nl external ear canals without cough reflex   NECK :  without JVD/Nodes/TM/ nl carotid upstrokes bilaterally   LUNGS: no acc muscle use,  Nl contour chest which is clear to A and P bilaterally with cough on insp    CV:  RRR  no s3 or murmur or increase in P2, and no edema   ABD:  soft and nontender with nl  inspiratory excursion in the supine position. No bruits or organomegaly appreciated, bowel sounds nl  MS:  Nl gait/ ext warm without deformities, calf tenderness, cyanosis or clubbing No obvious joint restrictions   SKIN: warm and dry without lesions    NEURO:  alert, approp, nl sensorium with  no motor or cerebellar deficits apparent.      I personally reviewed images and agree with radiology impression as follows:  CXR:   12/27/16  Minimal basilar atx with slt elevation L HD        Assessment:

## 2017-02-01 NOTE — Assessment & Plan Note (Signed)
See cxr 12/27/16 with min atx L HD elevation with no comparison studies   This is a minimal finding and of course would not explain a 15 year cough though the fact that he coughs on insp maneuvers can be seen in pts with occult ILD  Or any parenchymal (as opposed to lower airway) process and will need f/u if cough can't be eliminated - see uacs  Discussed in detail all the  indications, usual  risks and alternatives  relative to the benefits with patient who agrees to proceed with conservative f/u as outlined

## 2017-02-01 NOTE — Patient Instructions (Addendum)
Gabapentin 100 mg three times daily x one week then 200 three times a day for a week then 300 mg three times a day.  Stop xyzal and zyrtec  For drainage / throat tickle try take CHLORPHENIRAMINE  4 mg - take one every 4 hours as needed - available over the counter- may cause drowsiness so start with just a bedtime dose or two and see how you tolerate it before trying in daytime     GERD (REFLUX)  is an extremely common cause of respiratory symptoms just like yours , many times with no obvious heartburn at all.    It can be treated with medication, but also with lifestyle changes including elevation of the head of your bed (ideally with 6 inch  bed blocks),  Smoking cessation, avoidance of late meals, excessive alcohol, and avoid fatty foods, chocolate, peppermint, colas, red wine, and acidic juices such as orange juice.  NO MINT OR MENTHOL PRODUCTS SO NO COUGH DROPS  USE SUGARLESS CANDY INSTEAD (Jolley ranchers or Stover's or Life Savers) or even ice chips will also do - the key is to swallow to prevent all throat clearing. NO OIL BASED VITAMINS - use powdered substitutes.     Please schedule a follow up office visit in 4 weeks, sooner if needed

## 2017-03-01 ENCOUNTER — Encounter: Payer: Self-pay | Admitting: Internal Medicine

## 2017-03-01 ENCOUNTER — Ambulatory Visit (INDEPENDENT_AMBULATORY_CARE_PROVIDER_SITE_OTHER): Payer: Medicare Other | Admitting: Internal Medicine

## 2017-03-01 DIAGNOSIS — R938 Abnormal findings on diagnostic imaging of other specified body structures: Secondary | ICD-10-CM

## 2017-03-01 DIAGNOSIS — R05 Cough: Secondary | ICD-10-CM | POA: Diagnosis not present

## 2017-03-01 DIAGNOSIS — R058 Other specified cough: Secondary | ICD-10-CM

## 2017-03-01 DIAGNOSIS — R9389 Abnormal findings on diagnostic imaging of other specified body structures: Secondary | ICD-10-CM

## 2017-03-01 NOTE — Progress Notes (Signed)
Subjective:     Patient ID: Johnny [name], male   DOB: [birth year],     MRN: [id number]    Brief patient profile:  54 yowm quit smoking 1978 with cough x early 2000s referred to pulmonary clinic 02/01/2017 by Dr   Precious Haws  Allergy w/u by Shaune Leeks around 2010  And 2012 neg both times     History of Present Illness  02/01/2017 1st [facility] office visit/ [name]   Chief Complaint  Patient presents with  . Pulmonary Consult    Referred by Dr. Precious Haws. Pt c/o cough x 15 yrs. Cough is prod, but he is unsure of sputum color. He states cough occurs when he sits up but not at all when he stands up. He coughs when he first lies down at night, but cough does not wake him up.   present present every day even on trips eg to the beach > neg allergy w/u /gerd rx s benefit  Simeon Craft / ENT  No findings Dr Joya Gaskins eval > gabapentin did nothing but note last ov on 02/03/15 suggested much improved on 200 mg tid so cut bak to 200 bid  rec Gabapentin 100 mg three times daily x one week then 200 three times a day for a week then 300 mg three times a day. Stop xyzal and zyrtec For drainage / throat tickle try take CHLORPHENIRAMINE  4 mg - take one every 4 hours as needed - GERD diet     03/01/2017  f/u ov/Denene Alamillo re: uacs on gabapentin 300 tid and avg h1 just twice daily  Chief Complaint  Patient presents with  . Follow-up    Cough is much improved since last visit. He has noticed drowsiness since increase in gabapentin.   some drowsiness playing bridge ? From gabapentin vs H1/ no sob   No obvious day to day or daytime variability or assoc excess/ purulent sputum or mucus plugs or hemoptysis or cp or chest tightness, subjective wheeze or overt sinus or hb symptoms. No unusual exp hx or h/o childhood pna/ asthma or knowledge of premature birth.  Sleeping ok without nocturnal  or early am exacerbation  of respiratory  c/o's or need for noct saba. Also denies any obvious fluctuation of symptoms with  weather or environmental changes or other aggravating or alleviating factors except as outlined above   Current Medications, Allergies, Complete Past Medical History, Past Surgical History, Family History, and Social History were reviewed in [employer] record.  ROS  The following are not active complaints unless bolded sore throat, dysphagia, dental problems, itching, sneezing,  nasal congestion or excess/ purulent secretions, ear ache,   fever, chills, sweats, unintended wt loss, classically pleuritic or exertional cp,  orthopnea pnd or leg swelling, presyncope, palpitations, abdominal pain, anorexia, nausea, vomiting, diarrhea  or change in bowel or bladder habits, change in stools or urine, dysuria,hematuria,  rash, arthralgias, visual complaints, headache, numbness, weakness or ataxia or problems with walking or coordination,  change in mood/affect or memory.                 Objective:   Physical Exam    amb wm nad - all smiles      03/01/2017      186   02/01/17 181 lb 12.8 oz (82.5 kg)  10/14/14 160 lb 8 oz (72.8 kg)  04/23/14 156 lb (70.8 kg)    Vital signs reviewed  - - Note on arrival 02 sats  100%  on RA    HEENT: nl dentition, turbinates bilaterally, and oropharynx. Nl external ear canals without cough reflex   NECK :  without JVD/Nodes/TM/ nl carotid upstrokes bilaterally   LUNGS: no acc muscle use,  Nl contour chest which is clear to A and P bilaterally with now no cough on insp    CV:  RRR  no s3 or murmur or increase in P2, and no edema   ABD:  soft and nontender with nl inspiratory excursion in the supine position. No bruits or organomegaly appreciated, bowel sounds nl  MS:  Nl gait/ ext warm without deformities, calf tenderness, cyanosis or clubbing No obvious joint restrictions   SKIN: warm and dry without lesions    NEURO:  alert, approp, nl sensorium with  no motor or cerebellar deficits apparent.      I personally reviewed  images and agree with radiology impression as follows:  CXR:   12/27/16  Minimal basilar atx with slt elevation L HD        Assessment:

## 2017-03-01 NOTE — Patient Instructions (Signed)
Try just to take the chlorphenirmine during the day if needed but continue the bedtime dose   Continue gabapentin 300 three times daily but if afternoon drowsiness stop the noon dose   Please schedule a follow up visit in 3 months but call sooner if needed

## 2017-03-01 NOTE — Assessment & Plan Note (Signed)
DgEs 01/29/14 ? Asp > neg ST eval - Esophageal motility is within normal limits for age. No tertiary contractions observed. Normal gastroesophageal junction. No gastroesophageal reflux occurred or was elicited. Prompt emptying of contrast in the duodenum. 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  The hs cough which as the most bothersome has resolved and sleeping well now  rec only use the h1 prn which should reduce tendency to drowsiness but if not can hold the lunchtime gabapentin and if cough flares back up then take the gaba bfast, supper, hs instead  I had an extended discussion with the patient reviewing all relevant studies completed to date and  lasting 15 to 20 minutes of a 25 minute visit    Each maintenance medication was reviewed in detail including most importantly the difference between maintenance and prns and under what circumstances the prns are to be triggered using an action plan format that is not reflected in the computer generated alphabetically organized AVS.    Please see AVS for specific instructions unique to this visit that I personally wrote and verbalized to the the pt in detail and then reviewed with pt  by my nurse highlighting any  changes in therapy recommended at today's visit to their plan of care.

## 2017-03-01 NOTE — Assessment & Plan Note (Signed)
See cxr 12/27/16 with min atx L HD elevation with no comparison studies   Since no longer cough on deep insp doubt the cough has anything to do with the cxr and would not pursue w/u any further at this point  Discussed in detail all the  indications, usual  risks and alternatives  relative to the benefits with patient who agrees to proceed with conservative f/u as outlined

## 2017-05-31 ENCOUNTER — Ambulatory Visit: Payer: Medicare Other | Admitting: Internal Medicine

## 2018-05-29 DIAGNOSIS — H9313 Tinnitus, bilateral: Secondary | ICD-10-CM | POA: Insufficient documentation

## 2018-05-29 DIAGNOSIS — H6123 Impacted cerumen, bilateral: Secondary | ICD-10-CM

## 2018-05-29 HISTORY — DX: Impacted cerumen, bilateral: H61.23

## 2019-01-05 DIAGNOSIS — L219 Seborrheic dermatitis, unspecified: Secondary | ICD-10-CM

## 2019-01-05 DIAGNOSIS — M23200 Derangement of unspecified lateral meniscus due to old tear or injury, right knee: Secondary | ICD-10-CM | POA: Insufficient documentation

## 2019-01-05 HISTORY — DX: Derangement of unspecified lateral meniscus due to old tear or injury, right knee: M23.200

## 2019-01-05 HISTORY — DX: Seborrheic dermatitis, unspecified: L21.9

## 2019-12-25 ENCOUNTER — Ambulatory Visit: Payer: Medicare Other | Attending: Internal Medicine

## 2019-12-25 DIAGNOSIS — Z23 Encounter for immunization: Secondary | ICD-10-CM | POA: Insufficient documentation

## 2019-12-25 NOTE — Progress Notes (Signed)
   Covid-19 Vaccination Clinic  Name:  Johnny [name]    MRN: [id number] DOB: 05-Aug-1941  12/25/2019  Johnny [name] was observed post Covid-19 immunization for 15 minutes without incidence. He was provided with Vaccine Information Sheet and instruction to access the V-Safe system.   Johnny [name] was instructed to call 911 with any severe reactions post vaccine: Marland Kitchen Difficulty breathing  . Swelling of your face and throat  . A fast heartbeat  . A bad rash all over your body  . Dizziness and weakness    Immunizations Administered    Name Date Dose VIS Date Route   Pfizer COVID-19 Vaccine 12/25/2019  8:40 AM 0.3 mL 11/16/2019 Intramuscular   Manufacturer: [employer]   Lot: [id number]   [place]: [id number]

## 2020-01-07 DIAGNOSIS — K219 Gastro-esophageal reflux disease without esophagitis: Secondary | ICD-10-CM

## 2020-01-07 HISTORY — DX: Gastro-esophageal reflux disease without esophagitis: K21.9

## 2020-01-15 ENCOUNTER — Ambulatory Visit: Payer: Medicare Other | Attending: Internal Medicine

## 2020-01-15 DIAGNOSIS — Z23 Encounter for immunization: Secondary | ICD-10-CM | POA: Insufficient documentation

## 2020-01-15 NOTE — Progress Notes (Signed)
   Covid-19 Vaccination Clinic  Name:  Johnny [name]    MRN: [id number] DOB: 05-Feb-1941  01/15/2020  Johnny [name] was observed post Covid-19 immunization for 15 minutes without incidence. He was provided with Vaccine Information Sheet and instruction to access the V-Safe system.   Johnny [name] was instructed to call 911 with any severe reactions post vaccine: Marland Kitchen Difficulty breathing  . Swelling of your face and throat  . A fast heartbeat  . A bad rash all over your body  . Dizziness and weakness    Immunizations Administered    Name Date Dose VIS Date Route   Pfizer COVID-19 Vaccine 01/15/2020  8:30 AM 0.3 mL 11/16/2019 Intramuscular   Manufacturer: [place]   Lot: [id number]   [place]: [id number]

## 2020-01-31 ENCOUNTER — Encounter: Payer: Self-pay | Admitting: Gastroenterology

## 2020-03-06 ENCOUNTER — Ambulatory Visit (INDEPENDENT_AMBULATORY_CARE_PROVIDER_SITE_OTHER): Payer: Medicare Other | Admitting: Gastroenterology

## 2020-03-06 ENCOUNTER — Encounter: Payer: Self-pay | Admitting: Gastroenterology

## 2020-03-06 ENCOUNTER — Other Ambulatory Visit: Payer: Self-pay

## 2020-03-06 VITALS — BP 120/74 | HR 84 | Temp 98.4°F | Ht 70.0 in | Wt 195.2 lb

## 2020-03-06 DIAGNOSIS — Z86010 Personal history of colonic polyps: Secondary | ICD-10-CM

## 2020-03-06 NOTE — Patient Instructions (Signed)
It has been recommended to you by your physician that you have a(n) colonoscopy completed. Per your request, we did not schedule the procedure(s) today. Please contact our office at [phone] should you decide to have the procedure completed. You will be scheduled for a pre-visit and procedure at that time.  I value your feedback and thank you for entrusting [place] with your care. If you get a [facility] patient survey, I would appreciate you taking the time to let us know about your experience today. Thank you!

## 2020-03-06 NOTE — Progress Notes (Addendum)
Referring Provider: [name], MD Primary Care Physician:  [name], MD  Reason for Consultation:  History of colon polyps   IMPRESSION:  History of tubular adenoma    - tubular adenomas on pathology from [address]15    -No polyps seen on colonoscopy 2011 or 2016 Sigmoid diverticulosis Internal hemorrhoids History of GERD with LPR Chronic cough No family history of colon cancer or polyps  Although the patient felt that his last colonoscopy was 10 to 15 years ago it was actually only 5 years ago.  At that time Dr. Truman Hayward recommended surveillance in 5 years given his history of tubular adenomas.  Given the patient's age of 56 we had a long discussion today about the risks and benefits of colon cancer screening and in particular the risks of colonoscopy.  He is very motivated to proceed with colon cancer screening at this time given his exposure to multiple friends with cancer including a friend who recently was diagnosed with advanced colon cancer despite otherwise being healthy.Marland Kitchen  PLAN: Colonoscopy  I consented the patient discussing the risks, benefits, and alternatives to endoscopic evaluation. In particular, we discussed the risks that include, but are not limited to, reaction to medication, cardiopulmonary compromise, bleeding requiring blood transfusion, aspiration resulting in pneumonia, perforation requiring surgery, lack of diagnosis, severe illness requiring hospitalization, and even death. We reviewed the risk of missed lesion including polyps or even cancer. The patient acknowledges these risks and asks that we proceed.  I spent 40 minutes of time, including in depth chart review, independent review of results, communicating results with the patient directly, face-to-face time with the patient, coordinating care, ordering studies and medications as appropriate, and documentation.  HPI: Johnny [name] is a 79 y.o. male referred by Dr. Luciana Axe to discuss colon  cancer screening.  He feels like his last colonoscopy was 10-15 years ago. He has a friend undergoing treatment for colon cancer and he is anxious to be cleared.  Completed his second Covid vaccine several weeks ago.  Followed by Dr. Maurene Capes for for gastroesophageal reflux and LPR, last seen in 2015.  At that time he was having poor response to PPIs.  Esophageal manometry which was within normal limits at that time.  No reflux occurred or was elicited during the study.  Response to gabapentin was documented by Dr. Joya Gaskins at the [facility] in 2016.  He reports a chronic cough but denies any other ongoing GI symptoms.  He has recently been followed by Dr. Melvyn Novas.  The patient has a history of an adenoma. Pathology results in Epic show tubular adenomas on pathology in 2005. Procedure note available.   Records available to me through care everywhere from his prior care in [facility] show: Colonoscopy 12/26/2009: internal hemorrhoids. Colonoscopy with Dr. Mitchell Heir 02/17/2015: sigmoid diverticulosis, grade 1 internal hemorrhoids, and no polyps.  Repeat colonoscopy recommended in 5 years. He remembers intolerance to the prep in the past.   The patient denies any ongoing GI symptoms.  No known family history of colon cancer or polyps. No family history of uterine/endometrial cancer, pancreatic cancer or gastric/stomach cancer.   Past Medical History:  Diagnosis Date  . Dysphagia, pharyngoesophageal phase 02/14/2014  . GERD (gastroesophageal reflux disease)   . History of prostate cancer    S/P PROSTATECTOMY 2009  . Hydrocele    SCROTAL  . Hyperlipidemia   . Organic impotence   . Wears glasses     Past Surgical History:  Procedure Laterality Date  . BRAVO [place] STUDY N/A 05/06/2014   Procedure: BRAVO [place];  Surgeon: [name], MD;  Location: [facility];  Service: Endoscopy;  Laterality: N/A;  . ESOPHAGOGASTRODUODENOSCOPY (EGD) WITH PROPOFOL N/A 05/06/2014   Procedure:  ESOPHAGOGASTRODUODENOSCOPY (EGD) WITH PROPOFOL;  Surgeon: [name], MD;  Location: [facility];  Service: Endoscopy;  Laterality: N/A;  . HYDROCELE EXCISION Left 1994  . HYDROCELE EXCISION Right 10/15/2013   Procedure: HYDROCELECTOMY ADULT;  Surgeon: Ailene Rud, MD;  [facility];  Service: Urology;  Laterality: Right;  with excision of multiple epidyidmal cyst  . HYDROCELE EXCISION Left 10/14/2014   Procedure: EPIDIDYMAL CYST EXCISION, LEFT;  Surgeon: Ailene Rud, MD;  Location[facility];  Service: Urology;  Laterality: Left;  . INGUINAL HERNIA REPAIR Bilateral 10/15/2013   Procedure:  LAPAROSCOPIC EXPLORATION  with REPAIR OF BILATERAL INGUINAL HERNIA;  Surgeon: Adin Hector, MD;  Location: [facility];  Service: General;  Laterality: Bilateral;  . INSERTION OF MESH Bilateral 10/15/2013   Procedure: INSERTION OF MESH;  Surgeon: Adin Hector, MD;  Location: [facility];  Service: General;  Laterality: Bilateral;  . PILONIDAL CYST EXCISION  1970'S  . ROBOT ASSISTED LAPAROSCOPIC RADICAL PROSTATECTOMY  01-01-2008  DR Alinda Money  . TENOTOMY ACHILLES TENDON Right 11-11-2005  . TONSILLECTOMY     child    Current Outpatient Medications  Medication Sig Dispense Refill  . aspirin 81 MG tablet Take 81 mg by mouth every morning.     Marland Kitchen atorvastatin (LIPITOR) 20 MG tablet Take 20 mg by mouth every morning.     Marland Kitchen dextromethorphan-guaiFENesin (MUCINEX DM) 30-600 MG per 12 hr tablet Take 1 tablet by mouth 2 (two) times daily.    Marland Kitchen omeprazole (PRILOSEC) 40 MG capsule Take 40 mg by mouth at bedtime.    . ranitidine (ZANTAC) 150 MG tablet Take 150 mg by mouth 2 (two) times daily.     No current facility-administered medications for this visit.   Facility-Administered Medications Ordered in Other Visits  Medication Dose Route Frequency Provider Last Rate Last Admin  . bupivacaine (MARCAINE) 0.5 % (with pres)  injection 50 mL  50 mL Infiltration Once Carolan Clines, MD        Allergies as of 03/06/2020  . (No Known Allergies)    Family History  Problem Relation Age of Onset  . Bladder Cancer Son   . Hypertension Mother   . Colon cancer Neg Hx   . Liver cancer Neg Hx     Social History   Socioeconomic History  . Marital status: Widowed    Spouse name: Not on file  . Number of children: 3  . Years of education: MA  . Highest education level: Not on file  Occupational History  . Occupation: Retired    Comment: [employer]  Tobacco Use  . Smoking status: Former Smoker    Packs/day: 1.00    Years: 10.00    Pack years: 10.00    Types: Cigarettes    Quit date: 12/06/1976    Years since quitting: 43.2  . Smokeless tobacco: Never Used  Substance and Sexual Activity  . Alcohol use: Not Currently  . Drug use: No  . Sexual activity: Not on file  Other Topics Concern  . Not on file  Social History Narrative  . Not on file   Social Determinants of Health   Financial Resource Strain:   . Difficulty of Paying  Living Expenses:   Food Insecurity:   . Worried About Charity fundraiser in the Last Year:   . Arboriculturist in the Last Year:   Transportation Needs:   . Film/video editor (Medical):   Marland Kitchen Lack of Transportation (Non-Medical):   Physical Activity:   . Days of Exercise per Week:   . Minutes of Exercise per Session:   Stress:   . Feeling of Stress :   Social Connections:   . Frequency of Communication with Friends and Family:   . Frequency of Social Gatherings with Friends and Family:   . Attends Religious Services:   . Active Member of Clubs or Organizations:   . Attends Archivist Meetings:   Marland Kitchen Marital Status:   Intimate Partner Violence:   . Fear of Current or Ex-Partner:   . Emotionally Abused:   Marland Kitchen Physically Abused:   . Sexually Abused:     Review of Systems: 12 system ROS is negative except as noted above.   Physical Exam: General:    Alert,  well-nourished, pleasant and cooperative in NAD Head:  Normocephalic and atraumatic. Eyes:  Sclera clear, no icterus.   Conjunctiva pink. Ears:  Normal auditory acuity. Nose:  No deformity, discharge,  or lesions. Mouth:  No deformity or lesions.   Neck:  Supple; no masses or thyromegaly. Lungs:  Clear throughout to auscultation.   No wheezes. Heart:  Regular rate and rhythm; no murmurs. Abdomen:  Soft, nontender, nondistended, normal bowel sounds, no rebound or guarding. No hepatosplenomegaly.   Rectal:  Deferred  Msk:  Symmetrical. No boney deformities LAD: No inguinal or umbilical LAD Extremities:  No clubbing or edema. Neurologic:  Alert and  oriented x4;  grossly nonfocal Skin:  Intact without significant lesions or rashes. Psych:  Alert and cooperative. Normal mood and affect.     Kevon Tench L. Tarri Glenn, MD, MPH 03/06/2020, 8:52 AM

## 2020-09-04 DIAGNOSIS — R202 Paresthesia of skin: Secondary | ICD-10-CM

## 2020-09-04 HISTORY — DX: Paresthesia of skin: R20.2

## 2020-11-06 DIAGNOSIS — R0602 Shortness of breath: Secondary | ICD-10-CM

## 2020-11-06 HISTORY — DX: Shortness of breath: R06.02

## 2020-12-18 DIAGNOSIS — H2513 Age-related nuclear cataract, bilateral: Secondary | ICD-10-CM | POA: Diagnosis not present

## 2021-01-07 DIAGNOSIS — M2042 Other hammer toe(s) (acquired), left foot: Secondary | ICD-10-CM | POA: Diagnosis not present

## 2021-01-07 DIAGNOSIS — K219 Gastro-esophageal reflux disease without esophagitis: Secondary | ICD-10-CM | POA: Diagnosis not present

## 2021-01-07 DIAGNOSIS — G8929 Other chronic pain: Secondary | ICD-10-CM | POA: Diagnosis not present

## 2021-01-07 DIAGNOSIS — M23200 Derangement of unspecified lateral meniscus due to old tear or injury, right knee: Secondary | ICD-10-CM | POA: Diagnosis not present

## 2021-01-07 DIAGNOSIS — C61 Malignant neoplasm of prostate: Secondary | ICD-10-CM | POA: Diagnosis not present

## 2021-01-07 DIAGNOSIS — Z0000 Encounter for general adult medical examination without abnormal findings: Secondary | ICD-10-CM | POA: Diagnosis not present

## 2021-01-07 DIAGNOSIS — Z1321 Encounter for screening for nutritional disorder: Secondary | ICD-10-CM | POA: Diagnosis not present

## 2021-01-07 DIAGNOSIS — M25561 Pain in right knee: Secondary | ICD-10-CM | POA: Diagnosis not present

## 2021-01-07 DIAGNOSIS — E782 Mixed hyperlipidemia: Secondary | ICD-10-CM | POA: Diagnosis not present

## 2021-01-07 DIAGNOSIS — D649 Anemia, unspecified: Secondary | ICD-10-CM | POA: Diagnosis not present

## 2021-01-07 DIAGNOSIS — Z79899 Other long term (current) drug therapy: Secondary | ICD-10-CM | POA: Diagnosis not present

## 2021-01-07 DIAGNOSIS — R053 Chronic cough: Secondary | ICD-10-CM | POA: Diagnosis not present

## 2021-01-07 DIAGNOSIS — R5383 Other fatigue: Secondary | ICD-10-CM | POA: Diagnosis not present

## 2021-01-07 DIAGNOSIS — Z0001 Encounter for general adult medical examination with abnormal findings: Secondary | ICD-10-CM | POA: Diagnosis not present

## 2021-01-07 DIAGNOSIS — M2062 Acquired deformities of toe(s), unspecified, left foot: Secondary | ICD-10-CM | POA: Diagnosis not present

## 2021-01-07 DIAGNOSIS — R351 Nocturia: Secondary | ICD-10-CM | POA: Diagnosis not present

## 2021-01-07 DIAGNOSIS — Z9079 Acquired absence of other genital organ(s): Secondary | ICD-10-CM | POA: Diagnosis not present

## 2021-01-07 DIAGNOSIS — Z8546 Personal history of malignant neoplasm of prostate: Secondary | ICD-10-CM | POA: Diagnosis not present

## 2021-01-07 DIAGNOSIS — Z87891 Personal history of nicotine dependence: Secondary | ICD-10-CM | POA: Diagnosis not present

## 2021-01-07 DIAGNOSIS — E559 Vitamin D deficiency, unspecified: Secondary | ICD-10-CM | POA: Diagnosis not present

## 2021-01-07 DIAGNOSIS — R519 Headache, unspecified: Secondary | ICD-10-CM | POA: Diagnosis not present

## 2021-01-07 DIAGNOSIS — Z125 Encounter for screening for malignant neoplasm of prostate: Secondary | ICD-10-CM | POA: Diagnosis not present

## 2021-01-20 DIAGNOSIS — M25561 Pain in right knee: Secondary | ICD-10-CM | POA: Insufficient documentation

## 2021-01-20 HISTORY — DX: Pain in right knee: M25.561

## 2021-01-22 DIAGNOSIS — M1711 Unilateral primary osteoarthritis, right knee: Secondary | ICD-10-CM | POA: Diagnosis not present

## 2021-01-28 DIAGNOSIS — B079 Viral wart, unspecified: Secondary | ICD-10-CM | POA: Diagnosis not present

## 2021-01-28 DIAGNOSIS — M79671 Pain in right foot: Secondary | ICD-10-CM | POA: Diagnosis not present

## 2021-01-28 DIAGNOSIS — M2042 Other hammer toe(s) (acquired), left foot: Secondary | ICD-10-CM | POA: Diagnosis not present

## 2021-01-28 DIAGNOSIS — M79672 Pain in left foot: Secondary | ICD-10-CM | POA: Diagnosis not present

## 2021-01-28 DIAGNOSIS — M2041 Other hammer toe(s) (acquired), right foot: Secondary | ICD-10-CM | POA: Diagnosis not present

## 2021-03-25 DIAGNOSIS — Z23 Encounter for immunization: Secondary | ICD-10-CM | POA: Diagnosis not present

## 2021-04-02 DIAGNOSIS — R2241 Localized swelling, mass and lump, right lower limb: Secondary | ICD-10-CM | POA: Diagnosis not present

## 2021-04-16 DIAGNOSIS — R2241 Localized swelling, mass and lump, right lower limb: Secondary | ICD-10-CM | POA: Diagnosis not present

## 2021-05-07 DIAGNOSIS — M7989 Other specified soft tissue disorders: Secondary | ICD-10-CM | POA: Diagnosis not present

## 2021-05-07 DIAGNOSIS — R2231 Localized swelling, mass and lump, right upper limb: Secondary | ICD-10-CM | POA: Diagnosis not present

## 2021-06-09 DIAGNOSIS — R2241 Localized swelling, mass and lump, right lower limb: Secondary | ICD-10-CM | POA: Diagnosis not present

## 2021-06-09 DIAGNOSIS — M7989 Other specified soft tissue disorders: Secondary | ICD-10-CM | POA: Insufficient documentation

## 2021-06-09 HISTORY — DX: Other specified soft tissue disorders: M79.89

## 2021-08-11 DIAGNOSIS — M7989 Other specified soft tissue disorders: Secondary | ICD-10-CM | POA: Diagnosis not present

## 2021-08-11 DIAGNOSIS — R2241 Localized swelling, mass and lump, right lower limb: Secondary | ICD-10-CM | POA: Diagnosis not present

## 2021-08-20 DIAGNOSIS — Z23 Encounter for immunization: Secondary | ICD-10-CM | POA: Diagnosis not present

## 2021-10-20 DIAGNOSIS — H2513 Age-related nuclear cataract, bilateral: Secondary | ICD-10-CM | POA: Diagnosis not present

## 2021-11-06 DIAGNOSIS — K219 Gastro-esophageal reflux disease without esophagitis: Secondary | ICD-10-CM | POA: Diagnosis not present

## 2021-11-06 DIAGNOSIS — R0602 Shortness of breath: Secondary | ICD-10-CM | POA: Diagnosis not present

## 2021-11-06 DIAGNOSIS — R918 Other nonspecific abnormal finding of lung field: Secondary | ICD-10-CM | POA: Diagnosis not present

## 2021-11-06 DIAGNOSIS — J383 Other diseases of vocal cords: Secondary | ICD-10-CM | POA: Diagnosis not present

## 2021-11-06 DIAGNOSIS — R053 Chronic cough: Secondary | ICD-10-CM | POA: Diagnosis not present

## 2022-01-11 DIAGNOSIS — Z125 Encounter for screening for malignant neoplasm of prostate: Secondary | ICD-10-CM | POA: Diagnosis not present

## 2022-01-11 DIAGNOSIS — E559 Vitamin D deficiency, unspecified: Secondary | ICD-10-CM | POA: Diagnosis not present

## 2022-01-11 DIAGNOSIS — H9313 Tinnitus, bilateral: Secondary | ICD-10-CM | POA: Diagnosis not present

## 2022-01-11 DIAGNOSIS — C61 Malignant neoplasm of prostate: Secondary | ICD-10-CM | POA: Diagnosis not present

## 2022-01-11 DIAGNOSIS — D649 Anemia, unspecified: Secondary | ICD-10-CM | POA: Diagnosis not present

## 2022-01-11 DIAGNOSIS — Z0001 Encounter for general adult medical examination with abnormal findings: Secondary | ICD-10-CM | POA: Diagnosis not present

## 2022-01-11 DIAGNOSIS — Z13220 Encounter for screening for lipoid disorders: Secondary | ICD-10-CM | POA: Diagnosis not present

## 2022-01-11 DIAGNOSIS — R5383 Other fatigue: Secondary | ICD-10-CM | POA: Diagnosis not present

## 2022-01-11 DIAGNOSIS — Z192 Hormone resistant malignancy status: Secondary | ICD-10-CM | POA: Diagnosis not present

## 2022-01-11 DIAGNOSIS — E782 Mixed hyperlipidemia: Secondary | ICD-10-CM | POA: Diagnosis not present

## 2022-01-11 DIAGNOSIS — R053 Chronic cough: Secondary | ICD-10-CM | POA: Diagnosis not present

## 2022-01-11 DIAGNOSIS — Z0000 Encounter for general adult medical examination without abnormal findings: Secondary | ICD-10-CM | POA: Diagnosis not present

## 2022-03-22 DIAGNOSIS — R634 Abnormal weight loss: Secondary | ICD-10-CM | POA: Diagnosis not present

## 2022-03-22 DIAGNOSIS — R399 Unspecified symptoms and signs involving the genitourinary system: Secondary | ICD-10-CM | POA: Diagnosis not present

## 2022-03-22 DIAGNOSIS — R319 Hematuria, unspecified: Secondary | ICD-10-CM | POA: Diagnosis not present

## 2022-03-22 DIAGNOSIS — R5383 Other fatigue: Secondary | ICD-10-CM | POA: Diagnosis not present

## 2022-03-22 DIAGNOSIS — Z8546 Personal history of malignant neoplasm of prostate: Secondary | ICD-10-CM | POA: Diagnosis not present

## 2022-04-08 DIAGNOSIS — R319 Hematuria, unspecified: Secondary | ICD-10-CM | POA: Diagnosis not present

## 2022-04-08 DIAGNOSIS — R310 Gross hematuria: Secondary | ICD-10-CM | POA: Diagnosis not present

## 2022-04-08 DIAGNOSIS — Z8546 Personal history of malignant neoplasm of prostate: Secondary | ICD-10-CM | POA: Diagnosis not present

## 2022-04-15 DIAGNOSIS — K5730 Diverticulosis of large intestine without perforation or abscess without bleeding: Secondary | ICD-10-CM | POA: Diagnosis not present

## 2022-04-15 DIAGNOSIS — R319 Hematuria, unspecified: Secondary | ICD-10-CM | POA: Diagnosis not present

## 2022-04-15 DIAGNOSIS — R310 Gross hematuria: Secondary | ICD-10-CM | POA: Diagnosis not present

## 2022-05-03 ENCOUNTER — Emergency Department (HOSPITAL_COMMUNITY): Payer: Medicare Other

## 2022-05-03 ENCOUNTER — Inpatient Hospital Stay (HOSPITAL_COMMUNITY)
Admission: EM | Admit: 2022-05-03 | Discharge: 2022-05-07 | DRG: 564 | Disposition: A | Discharge status: Skilled Nursing Facility | Payer: Medicare Other | Source: Skilled Nursing Facility | Attending: Family Medicine | Admitting: Family Medicine

## 2022-05-03 ENCOUNTER — Encounter (HOSPITAL_COMMUNITY): Payer: Self-pay | Admitting: Emergency Medicine

## 2022-05-03 ENCOUNTER — Other Ambulatory Visit: Payer: Self-pay

## 2022-05-03 DIAGNOSIS — R609 Edema, unspecified: Secondary | ICD-10-CM | POA: Diagnosis not present

## 2022-05-03 DIAGNOSIS — I700 Atherosclerosis of aorta: Secondary | ICD-10-CM | POA: Diagnosis present

## 2022-05-03 DIAGNOSIS — R918 Other nonspecific abnormal finding of lung field: Secondary | ICD-10-CM | POA: Diagnosis present

## 2022-05-03 DIAGNOSIS — E785 Hyperlipidemia, unspecified: Secondary | ICD-10-CM | POA: Diagnosis present

## 2022-05-03 DIAGNOSIS — N179 Acute kidney failure, unspecified: Secondary | ICD-10-CM | POA: Diagnosis present

## 2022-05-03 DIAGNOSIS — S40011A Contusion of right shoulder, initial encounter: Secondary | ICD-10-CM

## 2022-05-03 DIAGNOSIS — K5900 Constipation, unspecified: Secondary | ICD-10-CM | POA: Diagnosis present

## 2022-05-03 DIAGNOSIS — Z79899 Other long term (current) drug therapy: Secondary | ICD-10-CM | POA: Diagnosis not present

## 2022-05-03 DIAGNOSIS — S3993XA Unspecified injury of pelvis, initial encounter: Secondary | ICD-10-CM | POA: Diagnosis not present

## 2022-05-03 DIAGNOSIS — Z7982 Long term (current) use of aspirin: Secondary | ICD-10-CM | POA: Diagnosis not present

## 2022-05-03 DIAGNOSIS — Z9079 Acquired absence of other genital organ(s): Secondary | ICD-10-CM

## 2022-05-03 DIAGNOSIS — I69891 Dysphagia following other cerebrovascular disease: Secondary | ICD-10-CM | POA: Diagnosis not present

## 2022-05-03 DIAGNOSIS — T796XXA Traumatic ischemia of muscle, initial encounter: Principal | ICD-10-CM

## 2022-05-03 DIAGNOSIS — S3991XA Unspecified injury of abdomen, initial encounter: Secondary | ICD-10-CM | POA: Diagnosis not present

## 2022-05-03 DIAGNOSIS — I959 Hypotension, unspecified: Secondary | ICD-10-CM | POA: Diagnosis not present

## 2022-05-03 DIAGNOSIS — S060XAA Concussion with loss of consciousness status unknown, initial encounter: Secondary | ICD-10-CM | POA: Diagnosis present

## 2022-05-03 DIAGNOSIS — Z87891 Personal history of nicotine dependence: Secondary | ICD-10-CM | POA: Diagnosis not present

## 2022-05-03 DIAGNOSIS — S0083XA Contusion of other part of head, initial encounter: Secondary | ICD-10-CM | POA: Diagnosis present

## 2022-05-03 DIAGNOSIS — L89891 Pressure ulcer of other site, stage 1: Secondary | ICD-10-CM

## 2022-05-03 DIAGNOSIS — G9341 Metabolic encephalopathy: Secondary | ICD-10-CM | POA: Diagnosis present

## 2022-05-03 DIAGNOSIS — R413 Other amnesia: Secondary | ICD-10-CM | POA: Diagnosis present

## 2022-05-03 DIAGNOSIS — M25552 Pain in left hip: Secondary | ICD-10-CM | POA: Diagnosis not present

## 2022-05-03 DIAGNOSIS — R29898 Other symptoms and signs involving the musculoskeletal system: Secondary | ICD-10-CM

## 2022-05-03 DIAGNOSIS — Y92009 Unspecified place in unspecified non-institutional (private) residence as the place of occurrence of the external cause: Secondary | ICD-10-CM | POA: Diagnosis not present

## 2022-05-03 DIAGNOSIS — K219 Gastro-esophageal reflux disease without esophagitis: Secondary | ICD-10-CM | POA: Diagnosis present

## 2022-05-03 DIAGNOSIS — S0993XA Unspecified injury of face, initial encounter: Secondary | ICD-10-CM | POA: Diagnosis not present

## 2022-05-03 DIAGNOSIS — I69828 Other speech and language deficits following other cerebrovascular disease: Secondary | ICD-10-CM | POA: Diagnosis not present

## 2022-05-03 DIAGNOSIS — Z8546 Personal history of malignant neoplasm of prostate: Secondary | ICD-10-CM | POA: Diagnosis not present

## 2022-05-03 DIAGNOSIS — R410 Disorientation, unspecified: Secondary | ICD-10-CM | POA: Diagnosis present

## 2022-05-03 DIAGNOSIS — W19XXXA Unspecified fall, initial encounter: Secondary | ICD-10-CM | POA: Diagnosis not present

## 2022-05-03 DIAGNOSIS — M19011 Primary osteoarthritis, right shoulder: Secondary | ICD-10-CM | POA: Diagnosis not present

## 2022-05-03 DIAGNOSIS — R269 Unspecified abnormalities of gait and mobility: Secondary | ICD-10-CM | POA: Diagnosis present

## 2022-05-03 DIAGNOSIS — R262 Difficulty in walking, not elsewhere classified: Secondary | ICD-10-CM

## 2022-05-03 DIAGNOSIS — Z7401 Bed confinement status: Secondary | ICD-10-CM | POA: Diagnosis not present

## 2022-05-03 DIAGNOSIS — M6282 Rhabdomyolysis: Secondary | ICD-10-CM | POA: Diagnosis not present

## 2022-05-03 DIAGNOSIS — R2689 Other abnormalities of gait and mobility: Secondary | ICD-10-CM | POA: Diagnosis not present

## 2022-05-03 DIAGNOSIS — Z043 Encounter for examination and observation following other accident: Secondary | ICD-10-CM | POA: Diagnosis not present

## 2022-05-03 DIAGNOSIS — M79603 Pain in arm, unspecified: Secondary | ICD-10-CM | POA: Diagnosis not present

## 2022-05-03 DIAGNOSIS — S199XXA Unspecified injury of neck, initial encounter: Secondary | ICD-10-CM | POA: Diagnosis not present

## 2022-05-03 DIAGNOSIS — R296 Repeated falls: Secondary | ICD-10-CM | POA: Diagnosis not present

## 2022-05-03 DIAGNOSIS — M47812 Spondylosis without myelopathy or radiculopathy, cervical region: Secondary | ICD-10-CM | POA: Diagnosis not present

## 2022-05-03 DIAGNOSIS — S299XXA Unspecified injury of thorax, initial encounter: Secondary | ICD-10-CM | POA: Diagnosis not present

## 2022-05-03 DIAGNOSIS — R9431 Abnormal electrocardiogram [ECG] [EKG]: Secondary | ICD-10-CM | POA: Diagnosis not present

## 2022-05-03 DIAGNOSIS — R4182 Altered mental status, unspecified: Principal | ICD-10-CM

## 2022-05-03 DIAGNOSIS — S060X9A Concussion with loss of consciousness of unspecified duration, initial encounter: Secondary | ICD-10-CM | POA: Diagnosis not present

## 2022-05-03 DIAGNOSIS — R159 Full incontinence of feces: Secondary | ICD-10-CM | POA: Diagnosis not present

## 2022-05-03 DIAGNOSIS — S40011D Contusion of right shoulder, subsequent encounter: Secondary | ICD-10-CM | POA: Diagnosis not present

## 2022-05-03 DIAGNOSIS — M7989 Other specified soft tissue disorders: Secondary | ICD-10-CM | POA: Diagnosis not present

## 2022-05-03 DIAGNOSIS — R220 Localized swelling, mass and lump, head: Secondary | ICD-10-CM | POA: Diagnosis not present

## 2022-05-03 DIAGNOSIS — I10 Essential (primary) hypertension: Secondary | ICD-10-CM | POA: Diagnosis not present

## 2022-05-03 HISTORY — DX: Traumatic ischemia of muscle, initial encounter: T79.6XXA

## 2022-05-03 HISTORY — DX: Unspecified fall, initial encounter: Y92.009

## 2022-05-03 HISTORY — DX: Pressure ulcer of other site, stage 1: L89.891

## 2022-05-03 HISTORY — DX: Metabolic encephalopathy: G93.41

## 2022-05-03 HISTORY — DX: Contusion of right shoulder, initial encounter: S40.011A

## 2022-05-03 LAB — RAPID URINE DRUG SCREEN, HOSP PERFORMED
Amphetamines: NOT DETECTED
Barbiturates: NOT DETECTED
Benzodiazepines: NOT DETECTED
Cocaine: NOT DETECTED
Opiates: NOT DETECTED
Tetrahydrocannabinol: NOT DETECTED

## 2022-05-03 LAB — URINALYSIS, ROUTINE W REFLEX MICROSCOPIC
Bacteria, UA: NONE SEEN
Bilirubin Urine: NEGATIVE
Glucose, UA: NEGATIVE mg/dL
Ketones, ur: 80 mg/dL — AB
Leukocytes,Ua: NEGATIVE
Nitrite: NEGATIVE
Protein, ur: 30 mg/dL — AB
Specific Gravity, Urine: >1.046 — ABNORMAL HIGH (ref 1.005–1.030)
pH: 5 (ref 5.0–8.0)

## 2022-05-03 LAB — AMMONIA: Ammonia: 13 umol/L (ref 9–35)

## 2022-05-03 LAB — PROTIME-INR
INR: 1.1 (ref 0.8–1.2)
Prothrombin Time: 14 seconds (ref 11.4–15.2)

## 2022-05-03 LAB — CBC WITH DIFFERENTIAL/PLATELET
Abs Immature Granulocytes: 0.06 10*3/uL (ref 0.00–0.07)
Basophils Absolute: 0 10*3/uL (ref 0.0–0.1)
Basophils Relative: 0 %
Eosinophils Absolute: 0 10*3/uL (ref 0.0–0.5)
Eosinophils Relative: 0 %
HCT: 43.6 % (ref 39.0–52.0)
Hemoglobin: 15 g/dL (ref 13.0–17.0)
Immature Granulocytes: 1 %
Lymphocytes Relative: 4 %
Lymphs Abs: 0.5 10*3/uL — ABNORMAL LOW (ref 0.7–4.0)
MCH: 32.1 pg (ref 26.0–34.0)
MCHC: 34.4 g/dL (ref 30.0–36.0)
MCV: 93.2 fL (ref 80.0–100.0)
Monocytes Absolute: 1.3 10*3/uL — ABNORMAL HIGH (ref 0.1–1.0)
Monocytes Relative: 11 %
Neutro Abs: 9.4 10*3/uL — ABNORMAL HIGH (ref 1.7–7.7)
Neutrophils Relative %: 84 %
Platelets: 157 10*3/uL (ref 150–400)
RBC: 4.68 MIL/uL (ref 4.22–5.81)
RDW: 11.8 % (ref 11.5–15.5)
WBC: 11.1 10*3/uL — ABNORMAL HIGH (ref 4.0–10.5)
nRBC: 0 % (ref 0.0–0.2)

## 2022-05-03 LAB — LACTIC ACID, PLASMA
Lactic Acid, Venous: 2 mmol/L (ref 0.5–1.9)
Lactic Acid, Venous: 2.3 mmol/L (ref 0.5–1.9)

## 2022-05-03 LAB — TROPONIN I (HIGH SENSITIVITY)
Troponin I (High Sensitivity): 20 ng/L — ABNORMAL HIGH (ref <18)
Troponin I (High Sensitivity): 20 ng/L — ABNORMAL HIGH (ref <18)

## 2022-05-03 LAB — COMPREHENSIVE METABOLIC PANEL
ALT: 44 U/L (ref 0–44)
AST: 203 U/L — ABNORMAL HIGH (ref 15–41)
Albumin: 3.4 g/dL — ABNORMAL LOW (ref 3.5–5.0)
Alkaline Phosphatase: 62 U/L (ref 38–126)
Anion gap: 6 (ref 5–15)
BUN: 37 mg/dL — ABNORMAL HIGH (ref 8–23)
CO2: 28 mmol/L (ref 22–32)
Calcium: 9.3 mg/dL (ref 8.9–10.3)
Chloride: 108 mmol/L (ref 98–111)
Creatinine, Ser: 1 mg/dL (ref 0.61–1.24)
GFR, Estimated: >60 mL/min (ref >60)
Glucose, Bld: 111 mg/dL — ABNORMAL HIGH (ref 70–99)
Potassium: 3.5 mmol/L (ref 3.5–5.1)
Sodium: 142 mmol/L (ref 135–145)
Total Bilirubin: 1.3 mg/dL — ABNORMAL HIGH (ref 0.3–1.2)
Total Protein: 6.2 g/dL — ABNORMAL LOW (ref 6.5–8.1)

## 2022-05-03 LAB — TSH: TSH: 2.758 u[IU]/mL (ref 0.350–4.500)

## 2022-05-03 LAB — CK: Total CK: 5015 U/L — ABNORMAL HIGH (ref 49–397)

## 2022-05-03 MED ORDER — LACTATED RINGERS IV BOLUS
1000.0000 mL | Freq: Once | INTRAVENOUS | Status: AC
  Administered 2022-05-03: 1000 mL via INTRAVENOUS

## 2022-05-03 MED ORDER — IOHEXOL 300 MG/ML  SOLN
100.0000 mL | Freq: Once | INTRAMUSCULAR | Status: AC | PRN
  Administered 2022-05-03: 100 mL via INTRAVENOUS

## 2022-05-03 MED ORDER — SODIUM CHLORIDE 0.9 % IV SOLN
500.0000 mg | Freq: Once | INTRAVENOUS | Status: AC
  Administered 2022-05-03: 500 mg via INTRAVENOUS
  Filled 2022-05-03: qty 5

## 2022-05-03 MED ORDER — SODIUM CHLORIDE 0.9 % IV SOLN: INTRAVENOUS | Status: AC

## 2022-05-03 MED ORDER — ONDANSETRON HCL 4 MG/2ML IJ SOLN: 4.0000 mg | Freq: Four times a day (QID) | INTRAMUSCULAR | Status: DC | PRN

## 2022-05-03 MED ORDER — SODIUM CHLORIDE 0.9 % IV SOLN
1.0000 g | Freq: Once | INTRAVENOUS | Status: AC
  Administered 2022-05-03: 1 g via INTRAVENOUS
  Filled 2022-05-03: qty 10

## 2022-05-03 MED ORDER — PANTOPRAZOLE SODIUM 40 MG PO TBEC
40.0000 mg | DELAYED_RELEASE_TABLET | Freq: Every day | ORAL | Status: DC
  Administered 2022-05-04 – 2022-05-07 (×4): 40 mg via ORAL
  Filled 2022-05-03 (×4): qty 1

## 2022-05-03 MED ORDER — ACETAMINOPHEN 650 MG RE SUPP: 650.0000 mg | Freq: Four times a day (QID) | RECTAL | Status: DC | PRN

## 2022-05-03 MED ORDER — ACETAMINOPHEN 325 MG PO TABS
650.0000 mg | ORAL_TABLET | Freq: Four times a day (QID) | ORAL | Status: DC | PRN
  Administered 2022-05-04 – 2022-05-07 (×5): 650 mg via ORAL
  Filled 2022-05-03 (×5): qty 2

## 2022-05-03 MED ORDER — ONDANSETRON HCL 4 MG PO TABS: 4.0000 mg | ORAL_TABLET | Freq: Four times a day (QID) | ORAL | Status: DC | PRN

## 2022-05-03 NOTE — Assessment & Plan Note (Signed)
Present on admission. Likely due to fall and lying on ground for prolonged period of time. Will ask for wound consult.

## 2022-05-03 NOTE — Assessment & Plan Note (Signed)
Likely due to fall. Continue with IV NS @ 100 ml/hr. Repeat CK in AM. Will need PT/OT consults.

## 2022-05-03 NOTE — ED Provider Notes (Signed)
[facility] EMERGENCY DEPARTMENT Provider Note   CSN: [id number] Arrival date & time: 05/03/22  1525     History  Chief Complaint  Patient presents with   Fall   Altered Mental Status    Johnny [name] is a 81 y.o. male with a history of HLD and GERD presenting to the ED with altered mental status after being found down.  Per EMS, staff at the patient's independent living facility went to check on him and found him lying on the ground on his right side.  It is unclear how long he was down for as it is an independent living facility in the residence or not frequently checked upon.  Patient is unable provide history.  He is able to state his name, that he is at the hospital, and that the year is 2023, but is otherwise unable to provide any explanation as to the surrounding events.  He does endorse some pain over his right shoulder, but otherwise has no complaints.   Fall  Altered Mental Status     Home Medications Prior to Admission medications   Medication Sig Start Date End Date Taking? Authorizing Provider  aspirin 81 MG tablet Take 81 mg by mouth every morning.     [provider]  atorvastatin (LIPITOR) 20 MG tablet Take 20 mg by mouth every morning.  08/31/13   [provider]  dextromethorphan-guaiFENesin (MUCINEX DM) 30-600 MG per 12 hr tablet Take 1 tablet by mouth 2 (two) times daily.    [provider]  omeprazole (PRILOSEC) 40 MG capsule Take 40 mg by mouth at bedtime. 01/16/20   [provider]  ranitidine (ZANTAC) 150 MG tablet Take 150 mg by mouth 2 (two) times daily.    [provider]      Allergies    Patient has no known allergies.    Review of Systems   Review of Systems  Unable to perform ROS: Mental status change   Physical Exam Updated Vital Signs BP 110/68   Pulse 85   Temp 98.9 F (37.2 C) (Rectal)   Resp (!) 21   Ht '5\' 10"'$  (1.778 m)   Wt 88.5 kg   SpO2 98%   BMI 27.98 kg/m   Physical Exam Constitutional:      Appearance: He is ill-appearing. He is not toxic-appearing or diaphoretic.     Comments: Elderly and chronically ill-appearing.  HENT:     Head: Normocephalic and atraumatic.     Right Ear: External ear normal.     Left Ear: External ear normal.     Nose: Nose normal.     Mouth/Throat:     Mouth: Mucous membranes are dry.     Pharynx: Oropharynx is clear.  Eyes:     General: No scleral icterus.    Extraocular Movements: Extraocular movements intact.     Pupils: Pupils are equal, round, and reactive to light.     Comments: Right periorbital edema and erythema.  Neck:     Comments: No midline C spine tenderness, crepitus, or deformities. Cardiovascular:     Rate and Rhythm: Normal rate and regular rhythm.     Pulses: Normal pulses.     Heart sounds: Normal heart sounds. No murmur heard.   No friction rub. No gallop.  Pulmonary:     Effort: Pulmonary effort is normal. No respiratory distress.     Breath sounds: Normal breath sounds. No stridor. No wheezing, rhonchi or rales.  Abdominal:  General: There is no distension.     Tenderness: There is no abdominal tenderness. There is no guarding or rebound.  Musculoskeletal:        General: No deformity.     Cervical back: Neck supple.     Right lower leg: No edema.     Left lower leg: No edema.     Comments: No focal bony tenderness over the extremities.  Pelvis is stable to AP and lateral compression.  No midline T or L-spine tenderness.  Skin:    General: Skin is warm and dry.     Comments: Pressure ulcers over the right temple, right posterior shoulder, right elbow, and right hip.  Neurological:     Mental Status: He is alert.     Comments: Patient is oriented to self, place, and time, but not oriented to situation and is unable to answer further questions appropriately. He is able to move all extremities spontaneously and equally.    ED Results / Procedures / Treatments   Labs (all  labs ordered are listed, but only abnormal results are displayed) Labs Reviewed  CBC WITH DIFFERENTIAL/PLATELET - Abnormal; Notable for the following components:      Result Value   WBC 11.1 (*)    Neutro Abs 9.4 (*)    Lymphs Abs 0.5 (*)    Monocytes Absolute 1.3 (*)    All other components within normal limits  COMPREHENSIVE METABOLIC PANEL - Abnormal; Notable for the following components:   Glucose, Bld 111 (*)    BUN 37 (*)    Total Protein 6.2 (*)    Albumin 3.4 (*)    AST 203 (*)    Total Bilirubin 1.3 (*)    All other components within normal limits  CK - Abnormal; Notable for the following components:   Total CK 5,015 (*)    All other components within normal limits  TROPONIN I (HIGH SENSITIVITY) - Abnormal; Notable for the following components:   Troponin I (High Sensitivity) 20 (*)    All other components within normal limits  CULTURE, BLOOD (ROUTINE X 2)  CULTURE, BLOOD (ROUTINE X 2)  PROTIME-INR  TSH  AMMONIA  URINALYSIS, ROUTINE W REFLEX MICROSCOPIC  RAPID URINE DRUG SCREEN, HOSP PERFORMED  LACTIC ACID, PLASMA  LACTIC ACID, PLASMA  TROPONIN I (HIGH SENSITIVITY)    EKG EKG Interpretation  Date/Time:  Monday May 03 2022 16:21:51 EDT Ventricular Rate:  82 PR Interval:  154 QRS Duration: 95 QT Interval:  395 QTC Calculation: 462 R Axis:   65 Text Interpretation: Sinus rhythm Probable left atrial enlargement Borderline repolarization abnormality No significant change since last tracing Confirmed by Gareth Morgan [phone]) on 05/03/2022 6:08:12 PM  Radiology CT Head Wo Contrast  Result Date: 05/03/2022 CLINICAL DATA:  Found down, right-sided facial swelling EXAM: CT HEAD WITHOUT CONTRAST TECHNIQUE: Contiguous axial images were obtained from the base of the skull through the vertex without intravenous contrast. RADIATION DOSE REDUCTION: This exam was performed according to the departmental dose-optimization program which includes automated exposure control,  adjustment of the mA and/or kV according to patient size and/or use of iterative reconstruction technique. COMPARISON:  09/26/2020, 02/20/2014 FINDINGS: Brain: No acute infarct or hemorrhage. Lateral ventricles and midline structures are unremarkable. No acute extra-axial fluid collections. No mass effect. Vascular: No hyperdense vessel or unexpected calcification. Skull: Asymmetric right-sided scalp edema could be dependent edema given clinical history. No underlying fractures. Remainder of the calvarium is unremarkable. Sinuses/Orbits: Paranasal sinuses are unremarkable. Chronic right mastoid  effusion. Other: None. IMPRESSION: 1. No acute intracranial process. 2. Right-sided scalp swelling likely due to dependent edema given history of patient being found down. Electronically Signed   By: Randa Ngo M.D.   On: 05/03/2022 18:26   CT Cervical Spine Wo Contrast  Result Date: 05/03/2022 CLINICAL DATA:  Trauma, right facial swelling EXAM: CT MAXILLOFACIAL WITHOUT CONTRAST CT CERVICAL SPINE WITHOUT CONTRAST TECHNIQUE: Multidetector CT imaging of the maxillofacial structures was performed. Multiplanar CT image reconstructions were also generated. A small metallic BB was placed on the right temple in order to reliably differentiate right from left. Multidetector CT imaging of the cervical spine was performed without intravenous contrast. Multiplanar CT image reconstructions were also generated. RADIATION DOSE REDUCTION: This exam was performed according to the departmental dose-optimization program which includes automated exposure control, adjustment of the mA and/or kV according to patient size and/or use of iterative reconstruction technique. COMPARISON:  Concurrent CT head. FINDINGS: CT MAXILLOFACIAL FINDINGS Osseous: No evidence of maxillofacial fracture. Nasal bones are intact. Mandibles intact. Bilateral mandibular condyles are well-seated in the TMJs. Orbits: Bilateral orbits, including the globes and  retroconal soft tissues, are within normal limits. Sinuses: The visualized paranasal sinuses are essentially clear. The mastoid air cells are unopacified. Soft tissues: Mild soft tissue swelling overlying the right frontal bone, right orbit, and right lateral zygoma/maxilla. Limited intracranial: Evaluated on dedicated CT head. CT CERVICAL FINDINGS Alignment: Normal cervical lordosis. Skull base and vertebrae: No acute fracture. No primary bone lesion or focal pathologic process. Soft tissues and spinal canal: No prevertebral fluid or swelling. No visible canal hematoma. Disc levels: Mild degenerative changes of the mid/lower cervical spine. Spinal canal is patent. Upper chest: Evaluated on dedicated CT chest. Other: None. IMPRESSION: Mild right facial soft tissue swelling. No evidence of maxillofacial fracture. No traumatic injury to the cervical spine. Mild degenerative changes. Electronically Signed   By: Julian Hy M.D.   On: 05/03/2022 18:28   CT CHEST ABDOMEN PELVIS W CONTRAST  Result Date: 05/03/2022 CLINICAL DATA:  Polytrauma, blunt fall EXAM: CT CHEST, ABDOMEN, AND PELVIS WITH CONTRAST TECHNIQUE: Multidetector CT imaging of the chest, abdomen and pelvis was performed following the standard protocol during bolus administration of intravenous contrast. RADIATION DOSE REDUCTION: This exam was performed according to the departmental dose-optimization program which includes automated exposure control, adjustment of the mA and/or kV according to patient size and/or use of iterative reconstruction technique. CONTRAST:  138m OMNIPAQUE IOHEXOL 300 MG/ML  SOLN COMPARISON:  CT abdomen pelvis 04/15/2022.  CT chest 11/04/2020 FINDINGS: CT CHEST FINDINGS Cardiovascular: Heart size is normal. No pericardial effusion. Thoracic aorta is normal in course and caliber. Scattered atherosclerotic calcifications of the aorta. Central pulmonary vasculature is within normal limits. Mediastinum/Nodes: No enlarged  mediastinal, hilar, or axillary lymph nodes. Thyroid gland, trachea, and esophagus demonstrate no significant findings. Lungs/Pleura: Small dependent opacity in the right lower lobe, atelectasis or pneumonia. 10 mm part solid subpleural nodule in the posterior right lower lobe (series 5, image 108), present on the previous exam, although solid component appears slightly increased. Additional scattered areas of subtle nodularity within the bilateral lung bases are similar in appearance to prior. No pleural effusion or pneumothorax. Musculoskeletal: Partially visualized soft tissue swelling and ill-defined fluid at the posterior aspect of the right shoulder with asymmetric enlargement of the posterior right deltoid muscle (series 3, images 1-7). Intramuscular collection within the deltoid muscle is not excluded. No acute osseous abnormality. Mildly exaggerated thoracic kyphosis. CT ABDOMEN PELVIS FINDINGS Hepatobiliary: No hepatic  injury or perihepatic hematoma. Gallbladder is unremarkable. Pancreas: Unremarkable. No pancreatic ductal dilatation or surrounding inflammatory changes. Spleen: No splenic injury or perisplenic hematoma. Adrenals/Urinary Tract: No adrenal hemorrhage or renal injury identified. No renal stone or hydronephrosis. Bladder is unremarkable. Stomach/Bowel: Stomach is within normal limits. No evidence of bowel wall thickening, distention, or inflammatory changes. Moderate volume stool distending the rectum. Vascular/Lymphatic: Aortic atherosclerosis. No enlarged abdominal or pelvic lymph nodes. Reproductive: Prior prostatectomy. Other: No free fluid. No abdominopelvic fluid collection. No pneumoperitoneum. No abdominal wall hernia. Musculoskeletal: Soft tissue swelling with ill-defined fluid overlying posterior right gluteal region (series 3, image 130). No well-defined fluid collection or hematoma. No acute osseous abnormality is identified. Pelvic bony ring intact. Degenerative changes of both  hips. Advanced multilevel lumbar spondylosis, similar in appearance to prior. IMPRESSION: 1. Partially visualized soft tissue swelling and ill-defined fluid at the posterior aspect of the right shoulder with asymmetric enlargement of the posterior right deltoid muscle, likely posttraumatic. Intramuscular hematoma within the deltoid muscle is not excluded. 2. Soft tissue swelling with ill-defined fluid overlying the right gluteal region without well-defined fluid collection or hematoma. 3. Small dependent opacity in the right lower lobe, atelectasis or pneumonia. 4. Multiple pulmonary nodules including a 10 mm part solid subpleural nodule in the posterior right lower lobe, present on the previous exam, although solid component appears slightly increased. Indolent neoplasm such as adenocarcinoma is not excluded. Consider one of the following in 3 months for both low-risk and high-risk individuals: (a) repeat chest CT, (b) follow-up PET-CT, or (c) tissue sampling. This recommendation follows the consensus statement: Guidelines for Management of Incidental Pulmonary Nodules Detected on CT Images: From the Fleischner Society 2017; Radiology 2017; 284:228-243. 5. No acute/traumatic abdominopelvic pathology. 6. Moderate volume stool distending the rectum. Correlate for constipation. 7. Aortic atherosclerosis (ICD10-I70.0). Electronically Signed   By: Davina Poke D.O.   On: 05/03/2022 18:38   CT Maxillofacial Wo Contrast  Result Date: 05/03/2022 CLINICAL DATA:  Trauma, right facial swelling EXAM: CT MAXILLOFACIAL WITHOUT CONTRAST CT CERVICAL SPINE WITHOUT CONTRAST TECHNIQUE: Multidetector CT imaging of the maxillofacial structures was performed. Multiplanar CT image reconstructions were also generated. A small metallic BB was placed on the right temple in order to reliably differentiate right from left. Multidetector CT imaging of the cervical spine was performed without intravenous contrast. Multiplanar CT image  reconstructions were also generated. RADIATION DOSE REDUCTION: This exam was performed according to the departmental dose-optimization program which includes automated exposure control, adjustment of the mA and/or kV according to patient size and/or use of iterative reconstruction technique. COMPARISON:  Concurrent CT head. FINDINGS: CT MAXILLOFACIAL FINDINGS Osseous: No evidence of maxillofacial fracture. Nasal bones are intact. Mandibles intact. Bilateral mandibular condyles are well-seated in the TMJs. Orbits: Bilateral orbits, including the globes and retroconal soft tissues, are within normal limits. Sinuses: The visualized paranasal sinuses are essentially clear. The mastoid air cells are unopacified. Soft tissues: Mild soft tissue swelling overlying the right frontal bone, right orbit, and right lateral zygoma/maxilla. Limited intracranial: Evaluated on dedicated CT head. CT CERVICAL FINDINGS Alignment: Normal cervical lordosis. Skull base and vertebrae: No acute fracture. No primary bone lesion or focal pathologic process. Soft tissues and spinal canal: No prevertebral fluid or swelling. No visible canal hematoma. Disc levels: Mild degenerative changes of the mid/lower cervical spine. Spinal canal is patent. Upper chest: Evaluated on dedicated CT chest. Other: None. IMPRESSION: Mild right facial soft tissue swelling. No evidence of maxillofacial fracture. No traumatic injury to the cervical spine.  Mild degenerative changes. Electronically Signed   By: Julian Hy M.D.   On: 05/03/2022 18:28    Procedures Procedures    Medications Ordered in ED Medications  cefTRIAXone (ROCEPHIN) 1 g in sodium chloride 0.9 % 100 mL IVPB (has no administration in time range)  azithromycin (ZITHROMAX) 500 mg in sodium chloride 0.9 % 250 mL IVPB (has no administration in time range)  lactated ringers bolus 1,000 mL (0 mLs Intravenous Stopped 05/03/22 1828)  iohexol (OMNIPAQUE) 300 MG/ML solution 100 mL (100 mLs  Intravenous Contrast Given 05/03/22 1812)  lactated ringers bolus 1,000 mL (1,000 mLs Intravenous New Bag/Given 05/03/22 1832)    ED Course/ Medical Decision Making/ A&P                           Medical Decision Making Amount and/or Complexity of Data Reviewed Labs: ordered. Radiology: ordered. ECG/medicine tests: ordered.  Risk Prescription drug management. Decision regarding hospitalization.   ELAN BRAINERD is a 81 y.o. male with a history of HLD and GERD presenting to the ED with altered mental status after being found down.  On exam, the patient is afebrile and hemodynamically stable.  He does have multiple pressure wounds including over the right temple, right elbow, right posterior shoulder, and right hip.  He also has right periorbital edema and erythema.  Given these pressure wounds, I am concerned that he was down for a prolonged period of time.  Otherwise he has no other obvious injuries.  Unclear the cause of what caused the patient to be on the ground.  He is unable to provide any explanation as to the surrounding events.  Differentials include ACS, syncope, trauma (fall), infection.  Given the concern for trauma, will obtain trauma scans including a CT head, CT C-spine, CT face, and CT chest/abdomen/pelvis for further evaluation.  I spoke with the patient's son, Chrissie Noa, over the phone (he lives in [place]).  He states that he last spoke to his father yesterday and he was largely his normal self but was complaining of feeling very tired.  Patient just moved into the independent living facility about 1 week ago.  He had been attempting to contact his father multiple times today but had not heard from him.  CT of the head is negative for acute intracranial abnormality.  CT C-spine negative for acute fracture or malalignment.  CT face and CT chest/abdomen/pelvis also negative for acute traumatic abnormality.  CT of the face did show some right-sided scalp swelling likely  dependent edema.  Similar changes also found on the right hip and right posterior shoulder consistent with his pressure wounds/dependent edema.  CT of the chest did show a questionable pneumonia, given patient remains altered and unable to provide further history, will treat for community-acquired pneumonia with IV Rocephin and IV azithromycin.  CBC notable for mild leukocytosis with a WBC of 11.1.  CMP notable for mild BUN elevation to 37 and AST elevation to 203.  CK is elevated to 5000.  TSH is normal.  Ammonia is normal.  Troponin is elevated to 20.  Given the concern for prolonged downtime and possible rhabdomyolysis in the setting of CK elevation, patient was given 2 L of IV fluid.  Hospitalist team was contacted for admission the patient was admitted to their service in stable condition.        Final Clinical Impression(s) / ED Diagnoses Final diagnoses:  Altered mental status, unspecified altered mental status type  Rx / DC Orders ED Discharge Orders     None         Sondra Come, MD 05/03/22 Sharilyn Sites    Gareth Morgan, MD 05/04/22 1240

## 2022-05-03 NOTE — ED Triage Notes (Signed)
Per [facility] pt coming from [facility]- staff came to check on patient and found on floor. Patient confused and unsure of how long been on ground. Swelling to right arm, redness and skin breakdown to right shoulder and right hip. Right side of face is swollen and right eye swollen closed. C-collar in place.

## 2022-05-03 NOTE — ED Notes (Signed)
Please call son with an update he live in [place]  [phone]

## 2022-05-03 NOTE — H&P (Signed)
History and Physical    Johnny [name] NLZ:[id number] DOB: July 04, 1941 DOA: 05/03/2022  DOS: the patient was seen and examined on 05/03/2022  PCP: [name], MD   Patient coming from: Home  I have personally briefly reviewed patient's old medical records in [facility]  CC: fall HPI: 81 year old white male history of prior prostate cancer, history of prostatectomy, reflux, hyperlipidemia, presents to the ER after being found on the floor at [facility] (local senior care community).  Spoke with the patient's son Chrissie Noa.  He states that he was texting with his father yesterday.  Apparently the facility staff went to check on him today.  He was found on the ground.  EMS was called.  He was confused.  Patient brought to the ER.  On arrival temp 98.9 heart rate 88 blood pressure 116/88 satting 100% on room air.  Labs showed a CK of 5015 Sodium 142, potassium 3.5, BUN of 37, creatinine 1.0 AST elevated 203  Ammonia normal at 13 White count 11.1 hemoglobin 15.0 platelets 157  Trauma CTs including head cervical spine chest abdomen pelvis are all negative for fractures.  There is a soft tissue swelling of the posterior aspect the right shoulder in the posterior right deltoid muscle.  There is also a soft tissue swelling over the right gluteal region.  Due to the patient's rhabdomyolysis and continued encephalopathy, [facility] hospitalist contacted for admission    ED Course: Trauma scans negative for fracture.  CK elevated at greater than 5000.  Consistent with rhabdomyolysis.  Review of Systems:  Review of Systems  Unable to perform ROS: Other  acute encephalopathy  Past Medical History:  Diagnosis Date   Dysphagia, pharyngoesophageal phase 02/14/2014   GERD (gastroesophageal reflux disease)    History of prostate cancer    S/P PROSTATECTOMY 2009   Hydrocele    SCROTAL   Hyperlipidemia    Organic impotence    Wears glasses     Past Surgical History:   Procedure Laterality Date   BRAVO [place] STUDY N/A 05/06/2014   Procedure: BRAVO [place] STUDY;  Surgeon: [name], MD;  Location: [facility];  Service: Endoscopy;  Laterality: N/A;   ESOPHAGOGASTRODUODENOSCOPY (EGD) WITH PROPOFOL N/A 05/06/2014   Procedure: ESOPHAGOGASTRODUODENOSCOPY (EGD) WITH PROPOFOL;  Surgeon: [name], MD;  Location: [facility];  Service: Endoscopy;  Laterality: N/A;   HYDROCELE EXCISION Left 1994   HYDROCELE EXCISION Right 10/15/2013   Procedure: HYDROCELECTOMY ADULT;  Surgeon: Ailene Rud, MD;  Location: [facility];  Service: Urology;  Laterality: Right;  with excision of multiple epidyidmal cyst   HYDROCELE EXCISION Left 10/14/2014   Procedure: EPIDIDYMAL CYST EXCISION, LEFT;  Surgeon: Ailene Rud, MD;  Location: [facility];  Service: Urology;  Laterality: Left;   INGUINAL HERNIA REPAIR Bilateral 10/15/2013   Procedure:  LAPAROSCOPIC EXPLORATION  with REPAIR OF BILATERAL INGUINAL HERNIA;  Surgeon: Adin Hector, MD;  Location: [place];  Service: General;  Laterality: Bilateral;   INSERTION OF MESH Bilateral 10/15/2013   Procedure: INSERTION OF MESH;  Surgeon: Adin Hector, MD;  Location: [place];  Service: General;  Laterality: Bilateral;   PILONIDAL CYST EXCISION  1970'S   ROBOT ASSISTED LAPAROSCOPIC RADICAL PROSTATECTOMY  01-01-2008  DR [place]ah Right 11-11-2005   TONSILLECTOMY     child     reports that he quit smoking about 45 years ago. His smoking use included cigarettes. He  has a 10.00 pack-year smoking history. He has never used smokeless tobacco. He reports that he does not currently use alcohol. He reports that he does not use drugs.  No Known Allergies  Family History  Problem Relation Age of Onset   Bladder Cancer Son    Hypertension Mother    Colon cancer Neg Hx    Liver cancer Neg Hx     Prior to Admission medications    Medication Sig Start Date End Date Taking? Authorizing Provider  aspirin 81 MG tablet Take 81 mg by mouth every morning.     [provider]  atorvastatin (LIPITOR) 20 MG tablet Take 20 mg by mouth every morning.  08/31/13   [provider]  dextromethorphan-guaiFENesin (MUCINEX DM) 30-600 MG per 12 hr tablet Take 1 tablet by mouth 2 (two) times daily.    [provider]  omeprazole (PRILOSEC) 40 MG capsule Take 40 mg by mouth at bedtime. 01/16/20   [provider]  ranitidine (ZANTAC) 150 MG tablet Take 150 mg by mouth 2 (two) times daily.    [provider]    Physical Exam: Vitals:   05/03/22 1700 05/03/22 1800 05/03/22 1815 05/03/22 1930  BP: 123/68 122/80 110/68 126/73  Pulse: 85 90 85 94  Resp: 18 (!) 23 (!) 21 17  Temp:      TempSrc:      SpO2: 99% 99% 98% 100%  Weight:      Height:        Physical Exam Vitals and nursing note reviewed.  Constitutional:      Comments: Awake but not alert.  HENT:     Head:     Comments: Soft tissue swelling over the right periorbital region.  There is also a abrasion noted near the right temple. Eyes:     General: No scleral icterus. Cardiovascular:     Rate and Rhythm: Normal rate.  Pulmonary:     Effort: Pulmonary effort is normal. No respiratory distress.     Breath sounds: No wheezing or rales.  Abdominal:     General: Bowel sounds are normal. There is no distension.     Tenderness: There is no abdominal tenderness. There is no guarding.  Musculoskeletal:     Right lower leg: No edema.     Left lower leg: No edema.  Skin:    General: Skin is warm and dry.     Capillary Refill: Capillary refill takes less than 2 seconds.     Findings: Lesion present.     Comments: Abrasion noted over the right lateral periorbital region, soft tissue swelling of the right shoulder/posterior deltoid, decubitus stage I ulcer of the first set of the medical tarsal left foot.  See pictures.   Neurological:     Mental Status: He is disoriented.            Labs on Admission: I have personally reviewed following labs and imaging studies  CBC: Recent Labs  Lab 05/03/22 1616  WBC 11.1*  NEUTROABS 9.4*  HGB 15.0  HCT 43.6  MCV 93.2  PLT 614   Basic Metabolic Panel: Recent Labs  Lab 05/03/22 1616  NA 142  K 3.5  CL 108  CO2 28  GLUCOSE 111*  BUN 37*  CREATININE 1.00  CALCIUM 9.3   GFR: Estimated Creatinine Clearance: 63.8 mL/min (by C-G formula based on SCr of 1 mg/dL). Liver Function Tests: Recent Labs  Lab 05/03/22 1616  AST 203*  ALT 44  ALKPHOS  62  BILITOT 1.3*  PROT 6.2*  ALBUMIN 3.4*   No results for input(s): LIPASE, AMYLASE in the last 168 hours. Recent Labs  Lab 05/03/22 1616  AMMONIA 13   Coagulation Profile: Recent Labs  Lab 05/03/22 1616  INR 1.1   Cardiac Enzymes: Recent Labs  Lab 05/03/22 1616 05/03/22 1833  CKTOTAL 5,015*  --   TROPONINIHS 20* 20*   BNP (last 3 results) No results for input(s): PROBNP in the last 8760 hours. HbA1C: No results for input(s): HGBA1C in the last 72 hours. CBG: No results for input(s): GLUCAP in the last 168 hours. Lipid Profile: No results for input(s): CHOL, HDL, LDLCALC, TRIG, CHOLHDL, LDLDIRECT in the last 72 hours. Thyroid Function Tests: Recent Labs    05/03/22 1616  TSH 2.758   Anemia Panel: No results for input(s): VITAMINB12, FOLATE, FERRITIN, TIBC, IRON, RETICCTPCT in the last 72 hours. Urine analysis: No results found for: COLORURINE, APPEARANCEUR, LABSPEC, PHURINE, GLUCOSEU, HGBUR, BILIRUBINUR, KETONESUR, PROTEINUR, UROBILINOGEN, NITRITE, LEUKOCYTESUR  Radiological Exams on Admission: I have personally reviewed images CT Head Wo Contrast  Result Date: 05/03/2022 CLINICAL DATA:  Found down, right-sided facial swelling EXAM: CT HEAD WITHOUT CONTRAST TECHNIQUE: Contiguous axial images were obtained from the base of the skull through the vertex without intravenous  contrast. RADIATION DOSE REDUCTION: This exam was performed according to the departmental dose-optimization program which includes automated exposure control, adjustment of the mA and/or kV according to patient size and/or use of iterative reconstruction technique. COMPARISON:  09/26/2020, 02/20/2014 FINDINGS: Brain: No acute infarct or hemorrhage. Lateral ventricles and midline structures are unremarkable. No acute extra-axial fluid collections. No mass effect. Vascular: No hyperdense vessel or unexpected calcification. Skull: Asymmetric right-sided scalp edema could be dependent edema given clinical history. No underlying fractures. Remainder of the calvarium is unremarkable. Sinuses/Orbits: Paranasal sinuses are unremarkable. Chronic right mastoid effusion. Other: None. IMPRESSION: 1. No acute intracranial process. 2. Right-sided scalp swelling likely due to dependent edema given history of patient being found down. Electronically Signed   By: Randa Ngo M.D.   On: 05/03/2022 18:26   CT Cervical Spine Wo Contrast  Result Date: 05/03/2022 CLINICAL DATA:  Trauma, right facial swelling EXAM: CT MAXILLOFACIAL WITHOUT CONTRAST CT CERVICAL SPINE WITHOUT CONTRAST TECHNIQUE: Multidetector CT imaging of the maxillofacial structures was performed. Multiplanar CT image reconstructions were also generated. A small metallic BB was placed on the right temple in order to reliably differentiate right from left. Multidetector CT imaging of the cervical spine was performed without intravenous contrast. Multiplanar CT image reconstructions were also generated. RADIATION DOSE REDUCTION: This exam was performed according to the departmental dose-optimization program which includes automated exposure control, adjustment of the mA and/or kV according to patient size and/or use of iterative reconstruction technique. COMPARISON:  Concurrent CT head. FINDINGS: CT MAXILLOFACIAL FINDINGS Osseous: No evidence of maxillofacial  fracture. Nasal bones are intact. Mandibles intact. Bilateral mandibular condyles are well-seated in the TMJs. Orbits: Bilateral orbits, including the globes and retroconal soft tissues, are within normal limits. Sinuses: The visualized paranasal sinuses are essentially clear. The mastoid air cells are unopacified. Soft tissues: Mild soft tissue swelling overlying the right frontal bone, right orbit, and right lateral zygoma/maxilla. Limited intracranial: Evaluated on dedicated CT head. CT CERVICAL FINDINGS Alignment: Normal cervical lordosis. Skull base and vertebrae: No acute fracture. No primary bone lesion or focal pathologic process. Soft tissues and spinal canal: No prevertebral fluid or swelling. No visible canal hematoma. Disc levels: Mild degenerative changes of the mid/lower cervical  spine. Spinal canal is patent. Upper chest: Evaluated on dedicated CT chest. Other: None. IMPRESSION: Mild right facial soft tissue swelling. No evidence of maxillofacial fracture. No traumatic injury to the cervical spine. Mild degenerative changes. Electronically Signed   By: Julian Hy M.D.   On: 05/03/2022 18:28   CT CHEST ABDOMEN PELVIS W CONTRAST  Result Date: 05/03/2022 CLINICAL DATA:  Polytrauma, blunt fall EXAM: CT CHEST, ABDOMEN, AND PELVIS WITH CONTRAST TECHNIQUE: Multidetector CT imaging of the chest, abdomen and pelvis was performed following the standard protocol during bolus administration of intravenous contrast. RADIATION DOSE REDUCTION: This exam was performed according to the departmental dose-optimization program which includes automated exposure control, adjustment of the mA and/or kV according to patient size and/or use of iterative reconstruction technique. CONTRAST:  183m OMNIPAQUE IOHEXOL 300 MG/ML  SOLN COMPARISON:  CT abdomen pelvis 04/15/2022.  CT chest 11/04/2020 FINDINGS: CT CHEST FINDINGS Cardiovascular: Heart size is normal. No pericardial effusion. Thoracic aorta is normal in course  and caliber. Scattered atherosclerotic calcifications of the aorta. Central pulmonary vasculature is within normal limits. Mediastinum/Nodes: No enlarged mediastinal, hilar, or axillary lymph nodes. Thyroid gland, trachea, and esophagus demonstrate no significant findings. Lungs/Pleura: Small dependent opacity in the right lower lobe, atelectasis or pneumonia. 10 mm part solid subpleural nodule in the posterior right lower lobe (series 5, image 108), present on the previous exam, although solid component appears slightly increased. Additional scattered areas of subtle nodularity within the bilateral lung bases are similar in appearance to prior. No pleural effusion or pneumothorax. Musculoskeletal: Partially visualized soft tissue swelling and ill-defined fluid at the posterior aspect of the right shoulder with asymmetric enlargement of the posterior right deltoid muscle (series 3, images 1-7). Intramuscular collection within the deltoid muscle is not excluded. No acute osseous abnormality. Mildly exaggerated thoracic kyphosis. CT ABDOMEN PELVIS FINDINGS Hepatobiliary: No hepatic injury or perihepatic hematoma. Gallbladder is unremarkable. Pancreas: Unremarkable. No pancreatic ductal dilatation or surrounding inflammatory changes. Spleen: No splenic injury or perisplenic hematoma. Adrenals/Urinary Tract: No adrenal hemorrhage or renal injury identified. No renal stone or hydronephrosis. Bladder is unremarkable. Stomach/Bowel: Stomach is within normal limits. No evidence of bowel wall thickening, distention, or inflammatory changes. Moderate volume stool distending the rectum. Vascular/Lymphatic: Aortic atherosclerosis. No enlarged abdominal or pelvic lymph nodes. Reproductive: Prior prostatectomy. Other: No free fluid. No abdominopelvic fluid collection. No pneumoperitoneum. No abdominal wall hernia. Musculoskeletal: Soft tissue swelling with ill-defined fluid overlying posterior right gluteal region (series 3,  image 130). No well-defined fluid collection or hematoma. No acute osseous abnormality is identified. Pelvic bony ring intact. Degenerative changes of both hips. Advanced multilevel lumbar spondylosis, similar in appearance to prior. IMPRESSION: 1. Partially visualized soft tissue swelling and ill-defined fluid at the posterior aspect of the right shoulder with asymmetric enlargement of the posterior right deltoid muscle, likely posttraumatic. Intramuscular hematoma within the deltoid muscle is not excluded. 2. Soft tissue swelling with ill-defined fluid overlying the right gluteal region without well-defined fluid collection or hematoma. 3. Small dependent opacity in the right lower lobe, atelectasis or pneumonia. 4. Multiple pulmonary nodules including a 10 mm part solid subpleural nodule in the posterior right lower lobe, present on the previous exam, although solid component appears slightly increased. Indolent neoplasm such as adenocarcinoma is not excluded. Consider one of the following in 3 months for both low-risk and high-risk individuals: (a) repeat chest CT, (b) follow-up PET-CT, or (c) tissue sampling. This recommendation follows the consensus statement: Guidelines for Management of Incidental Pulmonary Nodules Detected on  CT Images: From the Fleischner Society 2017; Radiology 2017; 284:228-243. 5. No acute/traumatic abdominopelvic pathology. 6. Moderate volume stool distending the rectum. Correlate for constipation. 7. Aortic atherosclerosis (ICD10-I70.0). Electronically Signed   By: Davina Poke D.O.   On: 05/03/2022 18:38   CT Maxillofacial Wo Contrast  Result Date: 05/03/2022 CLINICAL DATA:  Trauma, right facial swelling EXAM: CT MAXILLOFACIAL WITHOUT CONTRAST CT CERVICAL SPINE WITHOUT CONTRAST TECHNIQUE: Multidetector CT imaging of the maxillofacial structures was performed. Multiplanar CT image reconstructions were also generated. A small metallic BB was placed on the right temple in order  to reliably differentiate right from left. Multidetector CT imaging of the cervical spine was performed without intravenous contrast. Multiplanar CT image reconstructions were also generated. RADIATION DOSE REDUCTION: This exam was performed according to the departmental dose-optimization program which includes automated exposure control, adjustment of the mA and/or kV according to patient size and/or use of iterative reconstruction technique. COMPARISON:  Concurrent CT head. FINDINGS: CT MAXILLOFACIAL FINDINGS Osseous: No evidence of maxillofacial fracture. Nasal bones are intact. Mandibles intact. Bilateral mandibular condyles are well-seated in the TMJs. Orbits: Bilateral orbits, including the globes and retroconal soft tissues, are within normal limits. Sinuses: The visualized paranasal sinuses are essentially clear. The mastoid air cells are unopacified. Soft tissues: Mild soft tissue swelling overlying the right frontal bone, right orbit, and right lateral zygoma/maxilla. Limited intracranial: Evaluated on dedicated CT head. CT CERVICAL FINDINGS Alignment: Normal cervical lordosis. Skull base and vertebrae: No acute fracture. No primary bone lesion or focal pathologic process. Soft tissues and spinal canal: No prevertebral fluid or swelling. No visible canal hematoma. Disc levels: Mild degenerative changes of the mid/lower cervical spine. Spinal canal is patent. Upper chest: Evaluated on dedicated CT chest. Other: None. IMPRESSION: Mild right facial soft tissue swelling. No evidence of maxillofacial fracture. No traumatic injury to the cervical spine. Mild degenerative changes. Electronically Signed   By: Julian Hy M.D.   On: 05/03/2022 18:28    EKG: My personal interpretation of EKG shows: NSR    Assessment/Plan Principal Problem:   Fall at home Active Problems:   Traumatic rhabdomyolysis ([place])   Acute metabolic encephalopathy   Traumatic hematoma of right shoulder   Decubitus ulcer of  left foot, stage 1   Assessment and Plan: * Fall at home Admit to med/surg bed. Updated pt's son Brinson Tozzi. Unclear the timing of pt's fall at home. But clearly pt has been on the ground for prolonged period of time. Trauma scans are negative. I do not think pt has pneumonia. Will not continue abx. Pt recently moved into independent living at [facility] about 3 weeks ago. He may need higher level of care at discharge.  Fall at home is a Acute illness/condition that poses a threat to life or bodily function.   Traumatic rhabdomyolysis ([place]) Likely due to fall. Continue with IV NS @ 100 ml/hr. Repeat CK in AM. Will need PT/OT consults.  Traumatic hematoma of right shoulder Present on admission. Acute. Continue to monitor. Repeat CK in AM.  Acute metabolic encephalopathy Likely due to fall, possibly concussion. CT head negative for acute CVA. Monitor overnight.  Decubitus ulcer of left foot, stage 1 Present on admission. Likely due to fall and lying on ground for prolonged period of time. Will ask for wound consult.       DVT prophylaxis: SCDs Code Status: Full Code verified with pt's son Chrissie Noa Family Communication: discussed with pt's son Chrissie Noa  Disposition Plan: return to [facility]. May  need ALF.  Consults called: none  Admission status: Inpatient, Med-Surg   Kristopher Oppenheim, DO [facility] 05/03/2022, 7:46 PM

## 2022-05-03 NOTE — Assessment & Plan Note (Addendum)
traumatic frozen shoulder? Vs rotator cuff injury. He'll need outpatient ortho follow up and physical therapy CT notable for soft tissue swelling and ill defined fluid at the posterior aspect of the right shoulder with asymmetric enlargement of the posterior right deltoid muscle, likely post traumatic  Plain films of R shoulder with moderate glenoheumeral generative changes, no acute osseus findings Very limited range of movement.  TTP over deltoid.  Otherwise, without significant TTP.  Will get [place] to eval for intramuscular hematoma.

## 2022-05-03 NOTE — ED Notes (Signed)
Pt c colar taken off at this time per MD approval

## 2022-05-03 NOTE — Assessment & Plan Note (Signed)
Likely due to fall, possibly concussion. CT head negative for acute CVA. Monitor overnight.

## 2022-05-03 NOTE — Subjective & Objective (Signed)
CC: fall HPI: 81 year old white male history of prior prostate cancer, history of prostatectomy, reflux, hyperlipidemia, presents to the ER after being found on the floor at [facility] (local senior care community).  Spoke with the patient's son Chrissie Noa.  He states that he was texting with his father yesterday.  Apparently the facility staff went to check on him today.  He was found on the ground.  EMS was called.  He was confused.  Patient brought to the ER.  On arrival temp 98.9 heart rate 88 blood pressure 116/88 satting 100% on room air.  Labs showed a CK of 5015 Sodium 142, potassium 3.5, BUN of 37, creatinine 1.0 AST elevated 203  Ammonia normal at 13 White count 11.1 hemoglobin 15.0 platelets 157  Trauma CTs including head cervical spine chest abdomen pelvis are all negative for fractures.  There is a soft tissue swelling of the posterior aspect the right shoulder in the posterior right deltoid muscle.  There is also a soft tissue swelling over the right gluteal region.  Due to the patient's rhabdomyolysis and continued encephalopathy, [facility] hospitalist contacted for admission

## 2022-05-03 NOTE — ED Notes (Signed)
Tele tracking placed for pt  

## 2022-05-03 NOTE — Assessment & Plan Note (Signed)
Admit to med/surg bed. Updated pt's son Johnny [name]. Unclear the timing of pt's fall at home. But clearly pt has been on the ground for prolonged period of time. Trauma scans are negative. I do not think pt has pneumonia. Will not continue abx. Pt recently moved into independent living at [name] about 3 weeks ago. He may need higher level of care at discharge.  Fall at home is Johnny [name] Acute illness/condition that poses Johnny [name] threat to life or bodily function.

## 2022-05-04 ENCOUNTER — Inpatient Hospital Stay (HOSPITAL_COMMUNITY): Payer: Medicare Other

## 2022-05-04 ENCOUNTER — Encounter (HOSPITAL_COMMUNITY): Payer: Self-pay | Admitting: Internal Medicine

## 2022-05-04 DIAGNOSIS — R918 Other nonspecific abnormal finding of lung field: Secondary | ICD-10-CM

## 2022-05-04 DIAGNOSIS — W19XXXA Unspecified fall, initial encounter: Secondary | ICD-10-CM | POA: Diagnosis not present

## 2022-05-04 DIAGNOSIS — G9341 Metabolic encephalopathy: Secondary | ICD-10-CM | POA: Diagnosis not present

## 2022-05-04 DIAGNOSIS — S060X9A Concussion with loss of consciousness of unspecified duration, initial encounter: Secondary | ICD-10-CM

## 2022-05-04 DIAGNOSIS — K5900 Constipation, unspecified: Secondary | ICD-10-CM

## 2022-05-04 DIAGNOSIS — I700 Atherosclerosis of aorta: Secondary | ICD-10-CM

## 2022-05-04 DIAGNOSIS — T796XXA Traumatic ischemia of muscle, initial encounter: Secondary | ICD-10-CM | POA: Diagnosis not present

## 2022-05-04 DIAGNOSIS — S060XAA Concussion with loss of consciousness status unknown, initial encounter: Secondary | ICD-10-CM

## 2022-05-04 HISTORY — DX: Constipation, unspecified: K59.00

## 2022-05-04 HISTORY — DX: Other nonspecific abnormal finding of lung field: R91.8

## 2022-05-04 HISTORY — DX: Atherosclerosis of aorta: I70.0

## 2022-05-04 LAB — COMPREHENSIVE METABOLIC PANEL
ALT: 40 U/L (ref 0–44)
AST: 165 U/L — ABNORMAL HIGH (ref 15–41)
Albumin: 2.9 g/dL — ABNORMAL LOW (ref 3.5–5.0)
Alkaline Phosphatase: 53 U/L (ref 38–126)
Anion gap: 11 (ref 5–15)
BUN: 31 mg/dL — ABNORMAL HIGH (ref 8–23)
CO2: 25 mmol/L (ref 22–32)
Calcium: 8.9 mg/dL (ref 8.9–10.3)
Chloride: 106 mmol/L (ref 98–111)
Creatinine, Ser: 0.9 mg/dL (ref 0.61–1.24)
GFR, Estimated: >60 mL/min (ref >60)
Glucose, Bld: 94 mg/dL (ref 70–99)
Potassium: 3.2 mmol/L — ABNORMAL LOW (ref 3.5–5.1)
Sodium: 142 mmol/L (ref 135–145)
Total Bilirubin: 1 mg/dL (ref 0.3–1.2)
Total Protein: 5.4 g/dL — ABNORMAL LOW (ref 6.5–8.1)

## 2022-05-04 LAB — CBC WITH DIFFERENTIAL/PLATELET
Abs Immature Granulocytes: 0.06 10*3/uL (ref 0.00–0.07)
Basophils Absolute: 0 10*3/uL (ref 0.0–0.1)
Basophils Relative: 0 %
Eosinophils Absolute: 0 10*3/uL (ref 0.0–0.5)
Eosinophils Relative: 0 %
HCT: 37.8 % — ABNORMAL LOW (ref 39.0–52.0)
Hemoglobin: 13.1 g/dL (ref 13.0–17.0)
Immature Granulocytes: 1 %
Lymphocytes Relative: 7 %
Lymphs Abs: 0.6 10*3/uL — ABNORMAL LOW (ref 0.7–4.0)
MCH: 32.5 pg (ref 26.0–34.0)
MCHC: 34.7 g/dL (ref 30.0–36.0)
MCV: 93.8 fL (ref 80.0–100.0)
Monocytes Absolute: 1.1 10*3/uL — ABNORMAL HIGH (ref 0.1–1.0)
Monocytes Relative: 12 %
Neutro Abs: 7.6 10*3/uL (ref 1.7–7.7)
Neutrophils Relative %: 80 %
Platelets: 153 10*3/uL (ref 150–400)
RBC: 4.03 MIL/uL — ABNORMAL LOW (ref 4.22–5.81)
RDW: 11.9 % (ref 11.5–15.5)
WBC: 9.4 10*3/uL (ref 4.0–10.5)
nRBC: 0 % (ref 0.0–0.2)

## 2022-05-04 LAB — CK: Total CK: 3822 U/L — ABNORMAL HIGH (ref 49–397)

## 2022-05-04 MED ORDER — SENNA 8.6 MG PO TABS
1.0000 | ORAL_TABLET | Freq: Every day | ORAL | Status: DC
  Administered 2022-05-04 – 2022-05-06 (×3): 8.6 mg via ORAL
  Filled 2022-05-04 (×3): qty 1

## 2022-05-04 MED ORDER — SODIUM CHLORIDE 0.9 % IV SOLN: INTRAVENOUS | Status: DC

## 2022-05-04 MED ORDER — POLYETHYLENE GLYCOL 3350 17 G PO PACK
17.0000 g | PACK | Freq: Two times a day (BID) | ORAL | Status: DC
  Administered 2022-05-04 – 2022-05-07 (×5): 17 g via ORAL
  Filled 2022-05-04 (×6): qty 1

## 2022-05-04 NOTE — Evaluation (Signed)
Physical Therapy Evaluation Patient Details Name: Johnny [name] MRN: [id number] DOB: [birth year] Today's Date: 05/04/2022  History of Present Illness  Pt is an 81yo M admitted 05/03/22 from independent living after being found on the floor by staff. rhabdomyolosis present. CT of head, abdomen, and pelvis negative. PMH: HLD, GERD, prostate cancer  Clinical Impression  Prior to admission, pt reports being independent will all ADLs/IADLs although patient is a poor historian and family was not present to confirm. Pt is A&O x 1 and requires cueing for orientation throughout session. Pt requiring modA +2 for bed mobility, maxA for transfers, and modA +2 for safety to get to [facility]. Pt mobility limited by pain in R shoulder and R hip as well as reports of blurred vision in R eye. Noted increased rigidity and borderline ataxic movements. Hopeful that this will improve as rhabdomyolysis resolves. Based on performance today, feel pt could benefit from SNF level rehab to decrease risk for falls, improve tolerance for functional activity, address cognitive deficits, and return to PLOF.     Recommendations for follow up therapy are one component of a multi-disciplinary discharge planning process, led by the attending physician.  Recommendations may be updated based on patient status, additional functional criteria and insurance authorization.  Follow Up Recommendations Skilled nursing-short term rehab (<3 hours/day)    Assistance Recommended at Discharge Frequent or constant Supervision/Assistance  Patient can return home with the following  Two people to help with walking and/or transfers;Two people to help with bathing/dressing/bathroom;Assistance with cooking/housework;Direct supervision/assist for medications management;Direct supervision/assist for financial management;Assist for transportation;Help with stairs or ramp for entrance    Equipment Recommendations Rolling walker (2 wheels)  Recommendations for  Other Services       Functional Status Assessment Patient has had a recent decline in their functional status and demonstrates the ability to make significant improvements in function in a reasonable and predictable amount of time.     Precautions / Restrictions Precautions Precautions: Fall Restrictions Weight Bearing Restrictions: No      Mobility  Bed Mobility Overal bed mobility: Needs Assistance Bed Mobility: Supine to Sit     Supine to sit: Mod assist, +2 for physical assistance     General bed mobility comments: Required verbal cues throughout, assistance to initiate movements with LEs and trunk support to achieve full sit.    Transfers Overall transfer level: Needs assistance Equipment used: Rolling walker (2 wheels) Transfers: Sit to/from Stand, Bed to chair/wheelchair/BSC Sit to Stand: Mod assist, +2 physical assistance, Max assist   Step pivot transfers: Mod assist, +2 safety/equipment       General transfer comment: verbal cues for hand placement and manual assist to help move UEs to and from RW, modA +2 for initial STS from EOB, maxA of one for subsequent STS trials from EOB and BSC. multiple STS trials from [place]    Ambulation/Gait                  Stairs            Wheelchair Mobility    Modified Rankin (Stroke Patients Only)       Balance Overall balance assessment: Needs assistance Sitting-balance support: Bilateral upper extremity supported, Feet supported Sitting balance-Leahy Scale: Fair     Standing balance support: Reliant on assistive device for balance Standing balance-Leahy Scale: Poor Standing balance comment: requiring consistent manual assist to maintain balance along with reliance on RW  Pertinent Vitals/Pain Pain Assessment Pain Assessment: Faces Faces Pain Scale: Hurts little more (with movement) Pain Location: R shoulder, R hip Pain Descriptors / Indicators: Grimacing,  Guarding Pain Intervention(s): Limited activity within patient's tolerance, Monitored during session    Home Living Family/patient expects to be discharged to:: Assisted living                 Home Equipment: None      Prior Function Prior Level of Function : Patient poor historian/Family not available             Mobility Comments: Pt states he was independent prior to admission, unsure if this is true due to cognitive deficits. Unsure if pt was in independent living or assisted living section of [facility]. Pt reporting he has to contact staff every day by a certain time in order to check in and ensure safety.       Hand Dominance        Extremity/Trunk Assessment   Upper Extremity Assessment Upper Extremity Assessment: RUE deficits/detail;LUE deficits/detail (normal scapular motion, noted atrophy around R scapula) RUE Deficits / Details: Pain and guarding with movement of shoulder in all directions due to fall, noted rigidity and tremor and significant pain with ER. unable to achieve full passive flexion of shoulder due to guarding and rigidity LUE Deficits / Details: Guarding and pain with movement, rigidity and tremor noted    Lower Extremity Assessment Lower Extremity Assessment: Generalized weakness;RLE deficits/detail (rigidity bilaterally) RLE Deficits / Details: increased pain with hip and knee flexion at anterior hip, rigidity noted throughout    Cervical / Trunk Assessment Cervical / Trunk Assessment: Kyphotic  Communication   Communication: No difficulties  Cognition Arousal/Alertness: Awake/alert Behavior During Therapy: WFL for tasks assessed/performed Overall Cognitive Status: Impaired/Different from baseline Area of Impairment: Orientation, Attention, Memory, Following commands, Safety/judgement, Awareness, Problem solving                 Orientation Level: Disoriented to, Place, Time, Situation Current Attention Level:  Sustained Memory: Decreased short-term memory Following Commands: Follows one step commands inconsistently Safety/Judgement: Decreased awareness of safety Awareness: Intellectual Problem Solving: Slow processing, Decreased initiation, Difficulty sequencing, Requires verbal cues, Requires tactile cues General Comments: Pt oriented to person and time but not place or situation. States he is "at some nice lady's house". Pt has no recollection of fall or how he got to the hospital. States he was hanging out with a group of skiers last night in his hospital room.        General Comments General comments (skin integrity, edema, etc.): complaining of blurry vision in R eye at end of session    Exercises     Assessment/Plan    PT Assessment Patient needs continued PT services  PT Problem List Decreased strength;Decreased range of motion;Decreased activity tolerance;Decreased balance;Decreased mobility;Decreased cognition;Decreased knowledge of use of DME;Decreased safety awareness;Pain       PT Treatment Interventions DME instruction;Gait training;Stair training;Functional mobility training;Therapeutic activities;Therapeutic exercise;Balance training;Cognitive remediation;Patient/family education;Manual techniques    PT Goals (Current goals can be found in the Care Plan section)  Acute Rehab PT Goals Patient Stated Goal: to reduce pain PT Goal Formulation: With patient Time For Goal Achievement: 05/18/22 Potential to Achieve Goals: Good    Frequency Min 3X/week     Co-evaluation               AM-PAC PT "6 Clicks" Mobility  Outcome Measure Help needed turning from your back to your side  while in a flat bed without using bedrails?: A Lot Help needed moving from lying on your back to sitting on the side of a flat bed without using bedrails?: A Lot Help needed moving to and from a bed to a chair (including a wheelchair)?: A Lot Help needed standing up from a chair using your arms  (e.g., wheelchair or bedside chair)?: A Lot Help needed to walk in hospital room?: Total Help needed climbing 3-5 steps with a railing? : Total 6 Click Score: 10    End of Session Equipment Utilized During Treatment: Gait belt Activity Tolerance: Patient limited by fatigue;Patient limited by pain Patient left: in chair;with call bell/phone within reach;with chair alarm set Nurse Communication: Mobility status PT Visit Diagnosis: Unsteadiness on feet (R26.81);Other abnormalities of gait and mobility (R26.89);Muscle weakness (generalized) (M62.81);Pain Pain - Right/Left: Right Pain - part of body: Shoulder;Hip    Time: 1102-1205 PT Time Calculation (min) (ACUTE ONLY): 63 min   Charges:   PT Evaluation $PT Eval Moderate Complexity: 1 Mod PT Treatments $Gait Training: 8-22 mins $Therapeutic Activity: 23-37 mins       Mackie Pai, SPT Acute Rehabilitation Services  Office: [phone]   Mackie Pai 05/04/2022, 2:38 PM

## 2022-05-04 NOTE — Assessment & Plan Note (Addendum)
--  seen on CT. asymptomatic.  Bowel regimen.

## 2022-05-04 NOTE — Assessment & Plan Note (Addendum)
--  Multiple pulmonary nodules including Johnny [name] 10 mm part solid subpleural nodule in the posterior right lower lobe, present on the previous exam, although solid component appears slightly increased. Indolent neoplasm such as adenocarcinoma is not excluded. Consider one of the following in 3 months for both low-risk and high-risk individuals: (Shardae Kleinman) repeat chest CT, (b) follow-up PET-CT, or (c) tissue sampling. --follow-up with Dr. Camillo Flaming as outpatient

## 2022-05-04 NOTE — Plan of Care (Signed)
PT admitted for fall from SNF. Pt A&OX3. Multiple scattered bruises from fall. Pt started on IV fluids due to lactic acid 2.0. Dr. Arville Go aware. No c/o pain. Pt not on tele. Pt coplmlant with staff. BP 115/81 (BP Location: Left Arm)   Pulse 91   Temp 98.5 F (36.9 C) (Oral)   Resp 16   Ht '5\' 10"'$  (1.778 m)   Wt 88.5 kg   SpO2 99%   BMI 27.98 kg/m  No other needs voiced at this time. 2/4 bed railis up, call bell within reach, wheels locked and bed alarm on. No other facts to report on?  Jackelyn Hoehn Coady Train 1:38 AM. 05/04/22

## 2022-05-04 NOTE — Assessment & Plan Note (Addendum)
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 -- Monitor clinically. No focal neurologic deficits noted (left leg weakness noted today, but this is due to hip pain/discomfort - no need for MRI at this point without concerning neuro findings). -- delirium precautions -- follow with neurology outpatientx

## 2022-05-04 NOTE — Assessment & Plan Note (Signed)
--  noted, follow, on aspirin/lipitor

## 2022-05-04 NOTE — Progress Notes (Addendum)
Progress Note   Patient: Johnny [name] DOB: Apr 12, 1941 DOA: 05/03/2022     1 DOS: the patient was seen and examined on 05/04/2022   Brief hospital course: 81 year old [facility] was found down by staff for unknown period of time.  Admitted for acute rhabdomyolysis secondary to prolonged immobility, fall, acute metabolic encephalopathy probably from concussion.  Assessment and Plan: * Traumatic rhabdomyolysis ([place]) -- Secondary to fall, prolonged immobility -- CK trending down, continue IV fluids, trend BMP and CK  Fall at home -- Etiology unclear.  Patient has no memory of the events.  Soft tissue injuries including right shoulder greater than left, right facial hematoma, concussion with memory loss.  Trauma scans negative for acute skeletal injuries. 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 -- Monitor clinically. No definite focal neurologic deficits noted.  Traumatic hematoma of right shoulder --Present on admission.  Very limited range of movement.  No pain over scapula, shoulder girdle, upper arm. -- X-ray right shoulder negative -- Assess rotator cuff as swelling improves and pain decreases.  Acute metabolic encephalopathy --Secondary to fall, concussion. CT head negative. --some delirium and delusions currently --fall precautions  Constipation --seen on CT. asymptomatic.  Bowel regimen.  Decubitus ulcer of left foot, stage 1 --Present on admission.  Pressure injury secondary to prolonged immobility.  Wound consult.   Aortic atherosclerosis (HCC) --no treatment indicated  Pulmonary nodules --Multiple pulmonary nodules including a 10 mm part solid subpleural nodule in the posterior right lower lobe, present on the previous exam, although solid component appears slightly increased. Indolent neoplasm such as  adenocarcinoma is not excluded. Consider one of the following in 3 months for both low-risk and high-risk individuals: (a) repeat chest CT, (b) follow-up PET-CT, or (c) tissue sampling. --HOWEVER, see Dr. Cruz Condon note 11/2021 pulmonology; per son he was told these were stable; therefore just follow-up with Dr. Camillo Flaming.        Subjective:  Reports marked right shoulder pain, mild left shoulder pain, right eye swelling and blurred vision right eye Legs feel ok No abdominal pain Doesn't remember what happened  Physical Exam: Vitals:   05/04/22 0500 05/04/22 0618 05/04/22 0734 05/04/22 1546  BP:  112/60 93/74 117/62  Pulse:  80 71 88  Resp:  '16 18 18  '$ Temp:  98.1 F (36.7 C)  98 F (36.7 C)  TempSrc:  Oral  Oral  SpO2:  95% 100% 100%  Weight: 64 kg     Height:       Physical Exam Vitals reviewed.  Constitutional:      General: He is not in acute distress.    Appearance: He is not ill-appearing or toxic-appearing.  HENT:     Head:     Comments: Significant right periorbital swelling obscuring part of eye Cardiovascular:     Rate and Rhythm: Normal rate and regular rhythm.     Heart sounds: No murmur heard. Pulmonary:     Effort: Pulmonary effort is normal. No respiratory distress.     Breath sounds: No rhonchi or rales.  Abdominal:     General: Abdomen is flat. There is no distension.     Palpations: Abdomen is soft.     Tenderness: There is no abdominal tenderness.  Musculoskeletal:     Right lower leg: No edema.     Left lower leg: No edema.  Comments: LUE ROM grossly unremarkable; RUE shoulder movement severely limited secondary to pain; no pain with palpation over scapula, bony shoulder girdle, humerus, elbow.   Skin:    Comments: Ecchymosis right shoulder, right face  Neurological:     Mental Status: He is alert.     Comments: Oriented to location, year, wasn't sure about month, thought football had started; talking about [place], [place];  bought his house, moved in but couldn't pay  Psychiatric:        Mood and Affect: Mood normal.        Behavior: Behavior normal.    Data Reviewed:  K+ 3.2 CK down to 3822 CBC stable Right shoulder xray negative  Family Communication: son Yvone Neu  Disposition: Status is: Inpatient Remains inpatient appropriate because: acute rhabdomyolysis, concussion, needs SNF  Planned Discharge Destination: Skilled nursing facility    Time spent: 35 minutes  Author: Murray Hodgkins, MD 05/04/2022 6:54 PM  For on call review [email or web address].

## 2022-05-04 NOTE — Hospital Course (Signed)
81 year old [facility] was [facility] by staff for unknown period of time.  Admitted for acute rhabdomyolysis secondary to prolonged immobility, fall, acute metabolic encephalopathy probably from concussion.

## 2022-05-04 NOTE — NC FL2 (Signed)
[place] LEVEL OF CARE SCREENING TOOL     IDENTIFICATION  Patient Name: Johnny [name] Birthdate: Mar 29, 1941 Sex: male Admission Date (Current Location): 05/03/2022  [facility] and [place] Number:  Herbalist and Address:  [facility] [place] [address], [place], [place] [address]      Provider Number: [id number]  Attending Physician Name and Address:  Samuella Cota, MD  Relative Name and Phone Number:  dolton, shaker [phone])   [phone]    Current Level of Care: Hospital Recommended Level of Care: [place] Prior Approval Number:    Date Approved/Denied:   PASRR Number:    Discharge Plan: SNF    Current Diagnoses: Patient Active Problem List   Diagnosis Date Noted   Pulmonary nodules 05/04/2022   Constipation 05/04/2022   Aortic atherosclerosis ([place]) 05/04/2022   Fall at home 05/03/2022   Traumatic rhabdomyolysis ([place]) 42/70/6237   Acute metabolic encephalopathy 62/83/1517   Traumatic hematoma of right shoulder 05/03/2022   Decubitus ulcer of left foot, stage 1 05/03/2022   Upper airway cough syndrome 02/01/2017   Abnormal CXR 02/01/2017   Dysphagia, pharyngoesophageal phase 02/14/2014   Bilateral inguinal hernia (BIH) s/p lap repair w mesh 10/15/2013 10/31/2013   Hydrocele, right - s/p resection 10/15/2013 09/10/2013   Personal history of colonic polyps 09/10/2013   Prostate cancer ([place])     Orientation RESPIRATION BLADDER Height & Weight     Time, Place, Self  Normal Incontinent, External catheter Weight: 141 lb 1.5 oz (64 kg) Height:  '5\' 10"'$  (177.8 cm)  BEHAVIORAL SYMPTOMS/MOOD NEUROLOGICAL BOWEL NUTRITION STATUS      Incontinent Diet (See DC Summary)  AMBULATORY STATUS COMMUNICATION OF NEEDS Skin   Extensive Assist Verbally Normal                       Personal Care Assistance Level of Assistance  Bathing, Feeding, Dressing Bathing Assistance: Maximum assistance Feeding  assistance: Independent Dressing Assistance: Maximum assistance     Functional Limitations Info  Sight, Hearing Sight Info: Adequate Hearing Info: Adequate      SPECIAL CARE FACTORS FREQUENCY  PT (By licensed PT), OT (By licensed OT)     PT Frequency: 5x a week OT Frequency: 5x a week            Contractures Contractures Info: Not present    Additional Factors Info  Code Status, Allergies Code Status Info: Full Allergies Info: NKA           Current Medications (05/04/2022):  This is the current hospital active medication list Current Facility-Administered Medications  Medication Dose Route Frequency Provider Last Rate Last Admin   0.9 %  sodium chloride infusion   Intravenous Continuous Samuella Cota, MD 150 mL/hr at 05/04/22 1528 New Bag at 05/04/22 1528   acetaminophen (TYLENOL) tablet 650 mg  650 mg Oral Q6H PRN Kristopher Oppenheim, DO       Or   acetaminophen (TYLENOL) suppository 650 mg  650 mg Rectal Q6H PRN Kristopher Oppenheim, DO       ondansetron [facility]) tablet 4 mg  4 mg Oral Q6H PRN Kristopher Oppenheim, DO       Or   ondansetron [facility]) injection 4 mg  4 mg Intravenous Q6H PRN Kristopher Oppenheim, DO       pantoprazole (PROTONIX) EC tablet 40 mg  40 mg Oral Daily Kristopher Oppenheim, DO   40 mg at 05/04/22 1052   Facility-Administered  Medications Ordered in Other Encounters  Medication Dose Route Frequency Provider Last Rate Last Admin   bupivacaine (MARCAINE) 0.5 % (with pres) injection 50 mL  50 mL Infiltration Once Carolan Clines, MD         Discharge Medications: Please see discharge summary for a list of discharge medications.  Relevant Imaging Results:  Relevant Lab Results:   Additional Information SSN: [id number]; PFIZER Comrnaty([place] TOP) Covid-19 Vaccine 03/25/2021 , 01/15/2020 , 12/25/2019  Pfizer COVID-19 Vaccine 01/15/2020 , 12/25/2019  Reece Agar, LCSWA

## 2022-05-04 NOTE — TOC Initial Note (Addendum)
Transition of Care [facility]) - Initial/Assessment Note    Patient Details  Name: Johnny [name] MRN: [id number] Date of Birth: 09-01-1941  Transition of Care [facility]) CM/SW Contact:    Coralee Pesa, [place] Phone Number: 05/04/2022, 2:15 PM  Clinical Narrative:                 CSW spoke with [place] who confirmed pt is from their facility and they would follow in case he needs a higher level of care at DC. Pt is currently in the independent living. [facility] stated they are low on beds, so pt may need to go to [facility] for SNF if necessary. CSW spoke with son Chrissie Noa who confirms pt's current living situation and is agreeable to a higher level of care if necessary. PT notes are still pending at this time. Son would like to be updated when disposition is determined. TOC will continue to follow for DC needs.  SNF rec in and pt son agreeable, he is concerned about the level of confusion he is seeing, and would like an update from MD. [facility] will continue to follow for DC needs.  Expected Discharge Plan: Skilled Nursing Facility Barriers to Discharge: Continued Medical Work up, Other (must enter comment)   Patient Goals and CMS Choice        Expected Discharge Plan and Services Expected Discharge Plan: [place] Choice: [place] Living arrangements for the past 2 months: [place]                                      Prior Living Arrangements/Services Living arrangements for the past 2 months: [place] Lives with:: Facility Resident Patient language and need for interpreter reviewed:: Yes Do you feel safe going back to the place where you live?: Yes      Need for Family Participation in Patient Care: Yes (Comment) Care giver support system in place?: Yes (comment)   Criminal Activity/Legal Involvement Pertinent to Current Situation/Hospitalization: No - Comment as needed  Activities of  Daily Living Home Assistive Devices/Equipment: None ADL Screening (condition at time of admission) Patient's cognitive ability adequate to safely complete daily activities?: No Is the patient deaf or have difficulty hearing?: Yes Does the patient have difficulty seeing, even when wearing glasses/contacts?: Yes Does the patient have difficulty concentrating, remembering, or making decisions?: No Patient able to express need for assistance with ADLs?: No Does the patient have difficulty dressing or bathing?: Yes Independently performs ADLs?: No Does the patient have difficulty walking or climbing stairs?: Yes Weakness of Legs: Both Weakness of Arms/Hands: Both  Permission Sought/Granted Permission sought to share information with : Family Supports Permission granted to share information with : Yes, Verbal Permission Granted  Share Information with NAME: Joellyn Quails     Permission granted to share info w Relationship: Son  Permission granted to share info w Contact Information: [phone]  Emotional Assessment Appearance:: Appears stated age     Orientation: : Oriented to Self, Oriented to Place, Oriented to  Time Alcohol / Substance Use: Not Applicable Psych Involvement: No (comment)  Admission diagnosis:  Fall at home [W19.XXXA, Y92.009] Altered mental status, unspecified altered mental status type [R41.82] Patient Active Problem List   Diagnosis Date Noted   Pulmonary nodules 05/04/2022   Constipation 05/04/2022   Aortic atherosclerosis ([place]) 05/04/2022  Fall at home 05/03/2022   Traumatic rhabdomyolysis ([place]) 72/18/2883   Acute metabolic encephalopathy 37/44/5146   Traumatic hematoma of right shoulder 05/03/2022   Decubitus ulcer of left foot, stage 1 05/03/2022   Upper airway cough syndrome 02/01/2017   Abnormal CXR 02/01/2017   Dysphagia, pharyngoesophageal phase 02/14/2014   Bilateral inguinal hernia (BIH) s/p lap repair w mesh 10/15/2013 10/31/2013    Hydrocele, right - s/p resection 10/15/2013 09/10/2013   Personal history of colonic polyps 09/10/2013   Prostate cancer ([place])    PCP:  [name], MD Pharmacy:   [id number] [id number]: [phone]: [phone]    Social Determinants of Health (SDOH) Interventions    Readmission Risk Interventions     View : No data to display.

## 2022-05-04 NOTE — Evaluation (Signed)
Occupational Therapy Evaluation Patient Details Name: Johnny [name] MRN: [id number] DOB: 07/27/1941 Today's Date: 05/04/2022   History of Present Illness Pt is an 81yo M admitted 05/03/22 from independent living after being found on the floor by staff. rhabdomyolosis present. CT of head, abdomen, and pelvis negative. PMH: HLD, GERD, prostate cancer   Clinical Impression   Pt noted to have a mild tremor and tapping of his feet with sitting this session. Extensive chart review does not mention any pre-exiting dx that contributes to this presentation. Could benefit from family input if this is new or baseline for patient. Pt oriented to location and self this afternoon. [facility] in the last [facility] weeks uncertain what benefits can assist him to return. TOC made aware and to provide feedback to therapy team [phone] if d/c recommendations change due to inability to return to facility.Recommendation SNF level [facility]. Pt from [facility].       Recommendations for follow up therapy are one component of a multi-disciplinary discharge planning process, led by the attending physician.  Recommendations may be updated based on patient status, additional functional criteria and insurance authorization.   Follow Up Recommendations  Skilled nursing-short term rehab (<3 hours/day) ([facility]    Assistance Recommended at Discharge Set up Supervision/Assistance  Patient can return home with the following A lot of help with walking and/or transfers;A lot of help with bathing/dressing/bathroom    Functional Status Assessment  Patient has had a recent decline in their functional status and demonstrates the ability to make significant improvements in function in a reasonable and predictable amount of time.  Equipment Recommendations  BSC/3in1;Wheelchair (measurements OT);Wheelchair cushion (measurements OT);Hospital bed     Recommendations for Other Services       Precautions / Restrictions Precautions Precautions: Fall Restrictions Weight Bearing Restrictions: No      Mobility Bed Mobility               General bed mobility comments: oob on arrival    Transfers                   General transfer comment: declines sit to stand at this time. Just needs help watching finding a sports match on TV      Balance Overall balance assessment: Needs assistance Sitting-balance support: Bilateral upper extremity supported, Feet supported Sitting balance-Leahy Scale: Fair                                     ADL either performed or assessed with clinical judgement   ADL Overall ADL's : Needs assistance/impaired Eating/Feeding: Set up;Sitting Eating/Feeding Details (indicate cue type and reason): eating ice from pitcher with spoon Grooming: Wash/dry face;Set up;Sitting   Upper Body Bathing: Moderate assistance;Sitting   Lower Body Bathing: Moderate assistance;Sit to/from stand                               Vision Patient Visual Report: Blurring of vision Additional Comments: pt noted to have injury to R eye lid with swelling. pt reports blurred vision but with single eye occlusion denies blurred vision in either eye. pt in central vision reports bil eyes clear     Perception     Praxis      Pertinent Vitals/Pain Pain Assessment Pain Assessment:  Faces Faces Pain Scale: Hurts little more (with movement) Pain Location: R shoulder Pain Descriptors / Indicators: Grimacing, Guarding Pain Intervention(s): Monitored during session, Repositioned     Hand Dominance Right   Extremity/Trunk Assessment Upper Extremity Assessment Upper Extremity Assessment: RUE deficits/detail;LUE deficits/detail RUE Deficits / Details: Pain and guarding with movement of shoulder in all directions due to fall, noted rigidity and tremor and significant pain with ER. unable  to achieve full passive flexion of shoulder due to guarding and rigidity. pt able to complete lap slides, pt able to self feed. pt noted to have scapula retraction LUE Deficits / Details: pt noted to have a very prompt superior border compared to R UE. pt with flat border on the R side. pt denies any hx of injury to either shoulder prior to admission   Lower Extremity Assessment Lower Extremity Assessment: Defer to PT evaluation RLE Deficits / Details: increased pain with hip and knee flexion at anterior hip, rigidity noted throughout   Cervical / Trunk Assessment Cervical / Trunk Assessment: Kyphotic   Communication Communication Communication: No difficulties   Cognition Arousal/Alertness: Awake/alert Behavior During Therapy: WFL for tasks assessed/performed Overall Cognitive Status: Impaired/Different from baseline Area of Impairment: Orientation, Attention, Memory, Following commands, Safety/judgement, Awareness, Problem solving                   Current Attention Level: Sustained Memory: Decreased short-term memory Following Commands: Follows one step commands inconsistently Safety/Judgement: Decreased awareness of safety Awareness: Emergent Problem Solving: Slow processing General Comments: Pt able to reports location as [facility] in [place], pt aware that he had a fall but no details of the fall or admission to [facility]. pt reports I dont know why i cant remember that maybe a concussion? pt oriented to self     General Comments  complaining of blurry vision in R eye at end of session    Exercises Exercises: Shoulder Shoulder Exercises Shoulder Flexion: AAROM, Right, 10 reps, Seated Elbow Flexion: AROM, Right, 10 reps, Seated Wrist Flexion: AROM, Right, 10 reps, Seated   Shoulder Instructions      Home Living Family/patient expects to be discharged to:: Assisted living                             Home Equipment: None   Additional Comments:  [facility] patient. pt reports extreme stress moving out a home that he lived in for more than 30 years and shared with wife that passed 10 years ago      Prior Functioning/Environment Prior Level of Function : Patient poor historian/Family not available             Mobility Comments: Pt states he was independent prior to admission, unsure if this is true due to cognitive deficits. Unsure if pt was in independent living or assisted living section of [facility]. Pt reporting he has to contact staff every day by a certain time in order to check in and ensure safety.          OT Problem List: Decreased strength;Decreased activity tolerance;Impaired balance (sitting and/or standing);Decreased cognition;Impaired vision/perception;Decreased safety awareness;Decreased knowledge of use of DME or AE;Decreased knowledge of precautions;Cardiopulmonary status limiting activity;Impaired UE functional use      OT Treatment/Interventions: Self-care/ADL training;Therapeutic exercise;Energy conservation;DME and/or AE instruction;Modalities;Manual therapy;Therapeutic activities;Cognitive remediation/compensation;Patient/family education;Balance training;Visual/perceptual remediation/compensation    OT Goals(Current goals can be found in the care plan  section) Acute Rehab OT Goals Patient Stated Goal: to watch sports program OT Goal Formulation: Patient unable to participate in goal setting Time For Goal Achievement: 05/18/22 Potential to Achieve Goals: Good  OT Frequency: Min 2X/week    Co-evaluation              AM-PAC OT "6 Clicks" Daily Activity     Outcome Measure Help from another person eating meals?: A Little Help from another person taking care of personal grooming?: A Little Help from another person toileting, which includes using toliet, bedpan, or urinal?: A Lot Help from another person bathing (including washing, rinsing, drying)?: A Lot Help from  another person to put on and taking off regular upper body clothing?: A Lot Help from another person to put on and taking off regular lower body clothing?: A Lot 6 Click Score: 14   End of Session Nurse Communication: Mobility status;Precautions  Activity Tolerance: Patient tolerated treatment well Patient left: in chair;with call bell/phone within reach;with chair alarm set  OT Visit Diagnosis: Unsteadiness on feet (R26.81);Muscle weakness (generalized) (M62.81)                Time: 2542-7062 OT Time Calculation (min): 33 min Charges:  OT General Charges $OT Visit: 1 Visit OT Evaluation $OT Eval Moderate Complexity: 1 Mod   Brynn, OTR/L  Acute Rehabilitation Services Office: [phone] .   Jeri Modena 05/04/2022, 5:10 PM

## 2022-05-05 DIAGNOSIS — T796XXA Traumatic ischemia of muscle, initial encounter: Secondary | ICD-10-CM | POA: Diagnosis not present

## 2022-05-05 NOTE — Progress Notes (Signed)
Mobility Specialist Progress Note:   05/05/22 1500  Mobility  Activity Transferred to/from [facility]  Level of Assistance Minimal assist, patient does 75% or more (+2)  Assistive Device Front wheel walker  Distance Ambulated (ft) 2 ft  Activity Response Tolerated well  $Mobility charge 1 Mobility   Pt requesting to transfer from [facility] to chair. Required MinA+2 to transfer. Pt left in chair with all needs met.   Nelta Numbers Acute Rehab Secure Chat or Office Phone: [phone]

## 2022-05-05 NOTE — Progress Notes (Signed)
PROGRESS NOTE    Johnny [name]  HUO:[id number] DOB: December 01, 1941 DOA: 05/03/2022 PCP: [name], MD  Chief Complaint  Patient presents with   Fall   Altered Mental Status    Brief Narrative:  81 year old [facility], was found down by staff for unknown period of time.  Admitted for acute rhabdomyolysis secondary to prolonged immobility, fall, acute metabolic encephalopathy probably from concussion.    Assessment & Plan:   Principal Problem:   Traumatic rhabdomyolysis [facility]) Active Problems:   Fall at home   Concussion   Acute metabolic encephalopathy   Traumatic hematoma of right shoulder   Decubitus ulcer of left foot, stage 1   Constipation   Pulmonary nodules   Aortic atherosclerosis (HCC)   Assessment and Plan: * Traumatic rhabdomyolysis ([place]) -- Secondary to fall, prolonged immobility -- CK trending down, continue IV fluids, trend BMP and CK  Fall at home -- Etiology unclear.  Patient has no memory of the events.  Soft tissue injuries including right shoulder greater than left, right facial hematoma, concussion with memory loss.  Trauma scans negative for acute skeletal injuries. 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 -- Monitor clinically. No definite focal neurologic deficits noted.  Traumatic hematoma of right shoulder --Present on admission.  Very limited range of movement.  TTP over deltoid.   -- X-ray right shoulder negative -- Assess rotator cuff as swelling improves and pain decreases.  Will consider additional imaging or discussion with ortho.  Acute metabolic encephalopathy --Secondary to fall, concussion. CT head negative. --some delirium and delusions currently --fall precautions  Constipation --seen on CT. asymptomatic.  Bowel regimen.  Decubitus ulcer of left foot, stage 1 --Present  on admission.  Pressure injury secondary to prolonged immobility.  Wound consult.   Aortic atherosclerosis (HCC) --noted, follow, on aspirin/lipitor  Pulmonary nodules --Multiple pulmonary nodules including Nakima Fluegge 10 mm part solid subpleural nodule in the posterior right lower lobe, present on the previous exam, although solid component appears slightly increased. Indolent neoplasm such as adenocarcinoma is not excluded. Consider one of the following in 3 months for both low-risk and high-risk individuals: ([place]) repeat chest CT, (b) follow-up PET-CT, or (c) tissue sampling. --HOWEVER, see Dr. Cruz Condon note 11/2021 pulmonology; per son he was told these were stable; therefore just follow-up with Dr. Camillo Flaming.     DVT prophylaxis: SCD Code Status: full Family Communication: none Yitta Gongaware t bedside Disposition:   Status is: Inpatient Remains inpatient appropriate because: need to follow CK, R arm pain   Consultants:  none  Procedures:  none  Antimicrobials:  Anti-infectives (From admission, onward)    Start     Dose/Rate Route Frequency Ordered Stop   05/03/22 1900  cefTRIAXone (ROCEPHIN) 1 g in sodium chloride 0.9 % 100 mL IVPB        1 g 200 mL/hr over 30 Minutes Intravenous  Once 05/03/22 1852 05/03/22 2013   05/03/22 1900  azithromycin (ZITHROMAX) 500 mg in sodium chloride 0.9 % 250 mL IVPB        500 mg 250 mL/hr over 60 Minutes Intravenous  Once 05/03/22 1852 05/04/22 0139       Subjective: No new complaints  Objective: Vitals:   05/04/22 2024 05/05/22 0538 05/05/22 0808 05/05/22 1609  BP: 123/65 121/66 (!) 123/59 (!) 141/85  Pulse: 77 63 (!) 57 86  Resp: 18 18  17 18  Temp: 97.8 F (36.6 C) 97.6 F (36.4 C)  98.2 F (36.8 C)  TempSrc: Oral Oral  Oral  SpO2: 99% 100% 100% 100%  Weight:  67.8 kg 68.6 kg   Height:        Intake/Output Summary (Last 24 hours) at 05/05/2022 1810 Last data filed at 05/05/2022 1614 Gross per 24 hour  Intake 1831.18 ml  Output 650 ml  Net  1181.18 ml   Filed Weights   05/04/22 0500 05/05/22 0538 05/05/22 0808  Weight: 64 kg 67.8 kg 68.6 kg    Examination:  General exam: Appears calm and comfortable  Respiratory system: unlabored Cardiovascular system: RRR Gastrointestinal system: Abdomen is nondistended, soft and nontender. Central nervous system: Alert and oriented. No focal neurological deficits.  Limited RUE strength due to shoulder pain. Extremities: no LEE    Data Reviewed: I have personally reviewed following labs and imaging studies  CBC: Recent Labs  Lab 05/03/22 1616 05/04/22 0010  WBC 11.1* 9.4  NEUTROABS 9.4* 7.6  HGB 15.0 13.1  HCT 43.6 37.8*  MCV 93.2 93.8  PLT 157 810    Basic Metabolic Panel: Recent Labs  Lab 05/03/22 1616 05/04/22 0010  NA 142 142  K 3.5 3.2*  CL 108 106  CO2 28 25  GLUCOSE 111* 94  BUN 37* 31*  CREATININE 1.00 0.90  CALCIUM 9.3 8.9    GFR: Estimated Creatinine Clearance: 61.4 mL/min (by C-G formula based on SCr of 0.9 mg/dL).  Liver Function Tests: Recent Labs  Lab 05/03/22 1616 05/04/22 0010  AST 203* 165*  ALT 44 40  ALKPHOS 62 53  BILITOT 1.3* 1.0  PROT 6.2* 5.4*  ALBUMIN 3.4* 2.9*    CBG: No results for input(s): GLUCAP in the last 168 hours.   Recent Results (from the past 240 hour(s))  Blood culture (routine x 2)     Status: None (Preliminary result)   Collection Time: 05/03/22  7:08 PM   Specimen: BLOOD RIGHT HAND  Result Value Ref Range Status   Specimen Description BLOOD RIGHT HAND  Final   Special Requests   Final    BOTTLES DRAWN AEROBIC AND ANAEROBIC Blood Culture adequate volume   Culture   Final    NO GROWTH 2 DAYS Performed at [facility], [address] [place], [place] [address]    Report Status PENDING  Incomplete  Blood culture (routine x 2)     Status: None (Preliminary result)   Collection Time: 05/03/22  7:08 PM   Specimen: BLOOD  Result Value Ref Range Status   Specimen Description BLOOD RIGHT  ANTECUBITAL  Final   Special Requests   Final    BOTTLES DRAWN AEROBIC AND ANAEROBIC Blood Culture results may not be optimal due to an excessive volume of blood received in culture bottles   Culture   Final    NO GROWTH 2 DAYS Performed at [facility], [place] [address], [place], [place] [address]    Report Status PENDING  Incomplete         Radiology Studies: CT Head Wo Contrast  Result Date: 05/03/2022 CLINICAL DATA:  Found down, right-sided facial swelling EXAM: CT HEAD WITHOUT CONTRAST TECHNIQUE: Contiguous axial images were obtained from the base of the skull through the vertex without intravenous contrast. RADIATION DOSE REDUCTION: This exam was performed according to the departmental dose-optimization program which includes automated exposure control, adjustment of the mA and/or kV according to patient size and/or use of  iterative reconstruction technique. COMPARISON:  09/26/2020, 02/20/2014 FINDINGS: Brain: No acute infarct or hemorrhage. Lateral ventricles and midline structures are unremarkable. No acute extra-axial fluid collections. No mass effect. Vascular: No hyperdense vessel or unexpected calcification. Skull: Asymmetric right-sided scalp edema could be dependent edema given clinical history. No underlying fractures. Remainder of the calvarium is unremarkable. Sinuses/Orbits: Paranasal sinuses are unremarkable. Chronic right mastoid effusion. Other: None. IMPRESSION: 1. No acute intracranial process. 2. Right-sided scalp swelling likely due to dependent edema given history of patient being found down. Electronically Signed   By: Randa Ngo M.D.   On: 05/03/2022 18:26   CT Cervical Spine Wo Contrast  Result Date: 05/03/2022 CLINICAL DATA:  Trauma, right facial swelling EXAM: CT MAXILLOFACIAL WITHOUT CONTRAST CT CERVICAL SPINE WITHOUT CONTRAST TECHNIQUE: Multidetector CT imaging of the maxillofacial structures was performed. Multiplanar CT image reconstructions were also  generated. Ulani Degrasse small metallic BB was placed on the right temple in order to reliably differentiate right from left. Multidetector CT imaging of the cervical spine was performed without intravenous contrast. Multiplanar CT image reconstructions were also generated. RADIATION DOSE REDUCTION: This exam was performed according to the departmental dose-optimization program which includes automated exposure control, adjustment of the mA and/or kV according to patient size and/or use of iterative reconstruction technique. COMPARISON:  Concurrent CT head. FINDINGS: CT MAXILLOFACIAL FINDINGS Osseous: No evidence of maxillofacial fracture. Nasal bones are intact. Mandibles intact. Bilateral mandibular condyles are well-seated in the TMJs. Orbits: Bilateral orbits, including the globes and retroconal soft tissues, are within normal limits. Sinuses: The visualized paranasal sinuses are essentially clear. The mastoid air cells are unopacified. Soft tissues: Mild soft tissue swelling overlying the right frontal bone, right orbit, and right lateral zygoma/maxilla. Limited intracranial: Evaluated on dedicated CT head. CT CERVICAL FINDINGS Alignment: Normal cervical lordosis. Skull base and vertebrae: No acute fracture. No primary bone lesion or focal pathologic process. Soft tissues and spinal canal: No prevertebral fluid or swelling. No visible canal hematoma. Disc levels: Mild degenerative changes of the mid/lower cervical spine. Spinal canal is patent. Upper chest: Evaluated on dedicated CT chest. Other: None. IMPRESSION: Mild right facial soft tissue swelling. No evidence of maxillofacial fracture. No traumatic injury to the cervical spine. Mild degenerative changes. Electronically Signed   By: Julian Hy M.D.   On: 05/03/2022 18:28   CT CHEST ABDOMEN PELVIS W CONTRAST  Result Date: 05/03/2022 CLINICAL DATA:  Polytrauma, blunt fall EXAM: CT CHEST, ABDOMEN, AND PELVIS WITH CONTRAST TECHNIQUE: Multidetector CT imaging of  the chest, abdomen and pelvis was performed following the standard protocol during bolus administration of intravenous contrast. RADIATION DOSE REDUCTION: This exam was performed according to the departmental dose-optimization program which includes automated exposure control, adjustment of the mA and/or kV according to patient size and/or use of iterative reconstruction technique. CONTRAST:  185m OMNIPAQUE IOHEXOL 300 MG/ML  SOLN COMPARISON:  CT abdomen pelvis 04/15/2022.  CT chest 11/04/2020 FINDINGS: CT CHEST FINDINGS Cardiovascular: Heart size is normal. No pericardial effusion. Thoracic aorta is normal in course and caliber. Scattered atherosclerotic calcifications of the aorta. Central pulmonary vasculature is within normal limits. Mediastinum/Nodes: No enlarged mediastinal, hilar, or axillary lymph nodes. Thyroid gland, trachea, and esophagus demonstrate no significant findings. Lungs/Pleura: Small dependent opacity in the right lower lobe, atelectasis or pneumonia. 10 mm part solid subpleural nodule in the posterior right lower lobe (series 5, image 108), present on the previous exam, although solid component appears slightly increased. Additional scattered areas of subtle nodularity within the bilateral lung bases  are similar in appearance to prior. No pleural effusion or pneumothorax. Musculoskeletal: Partially visualized soft tissue swelling and ill-defined fluid at the posterior aspect of the right shoulder with asymmetric enlargement of the posterior right deltoid muscle (series 3, images 1-7). Intramuscular collection within the deltoid muscle is not excluded. No acute osseous abnormality. Mildly exaggerated thoracic kyphosis. CT ABDOMEN PELVIS FINDINGS Hepatobiliary: No hepatic injury or perihepatic hematoma. Gallbladder is unremarkable. Pancreas: Unremarkable. No pancreatic ductal dilatation or surrounding inflammatory changes. Spleen: No splenic injury or perisplenic hematoma. Adrenals/Urinary  Tract: No adrenal hemorrhage or renal injury identified. No renal stone or hydronephrosis. Bladder is unremarkable. Stomach/Bowel: Stomach is within normal limits. No evidence of bowel wall thickening, distention, or inflammatory changes. Moderate volume stool distending the rectum. Vascular/Lymphatic: Aortic atherosclerosis. No enlarged abdominal or pelvic lymph nodes. Reproductive: Prior prostatectomy. Other: No free fluid. No abdominopelvic fluid collection. No pneumoperitoneum. No abdominal wall hernia. Musculoskeletal: Soft tissue swelling with ill-defined fluid overlying posterior right gluteal region (series 3, image 130). No well-defined fluid collection or hematoma. No acute osseous abnormality is identified. Pelvic bony ring intact. Degenerative changes of both hips. Advanced multilevel lumbar spondylosis, similar in appearance to prior. IMPRESSION: 1. Partially visualized soft tissue swelling and ill-defined fluid at the posterior aspect of the right shoulder with asymmetric enlargement of the posterior right deltoid muscle, likely posttraumatic. Intramuscular hematoma within the deltoid muscle is not excluded. 2. Soft tissue swelling with ill-defined fluid overlying the right gluteal region without well-defined fluid collection or hematoma. 3. Small dependent opacity in the right lower lobe, atelectasis or pneumonia. 4. Multiple pulmonary nodules including Vern Prestia 10 mm part solid subpleural nodule in the posterior right lower lobe, present on the previous exam, although solid component appears slightly increased. Indolent neoplasm such as adenocarcinoma is not excluded. Consider one of the following in 3 months for both low-risk and high-risk individuals: ([place]) repeat chest CT, (b) follow-up PET-CT, or (c) tissue sampling. This recommendation follows the consensus statement: Guidelines for Management of Incidental Pulmonary Nodules Detected on CT Images: From the Fleischner Society 2017; Radiology 2017;  284:228-243. 5. No acute/traumatic abdominopelvic pathology. 6. Moderate volume stool distending the rectum. Correlate for constipation. 7. Aortic atherosclerosis (ICD10-I70.0). Electronically Signed   By: Davina Poke D.O.   On: 05/03/2022 18:38   DG Shoulder Right Port  Result Date: 05/04/2022 CLINICAL DATA:  Right shoulder pain and bruising. EXAM: RIGHT SHOULDER - 1 VIEW COMPARISON:  CT chest 05/03/2022. FINDINGS: The mineralization and alignment are normal. There is no evidence of acute fracture or dislocation. There are moderate glenohumeral degenerative changes. There is surrounding soft tissues appear unremarkable. IMPRESSION: Moderate glenohumeral degenerative changes. No acute osseous findings. Electronically Signed   By: Richardean Sale M.D.   On: 05/04/2022 14:16   CT Maxillofacial Wo Contrast  Result Date: 05/03/2022 CLINICAL DATA:  Trauma, right facial swelling EXAM: CT MAXILLOFACIAL WITHOUT CONTRAST CT CERVICAL SPINE WITHOUT CONTRAST TECHNIQUE: Multidetector CT imaging of the maxillofacial structures was performed. Multiplanar CT image reconstructions were also generated. Marrietta Thunder small metallic BB was placed on the right temple in order to reliably differentiate right from left. Multidetector CT imaging of the cervical spine was performed without intravenous contrast. Multiplanar CT image reconstructions were also generated. RADIATION DOSE REDUCTION: This exam was performed according to the departmental dose-optimization program which includes automated exposure control, adjustment of the mA and/or kV according to patient size and/or use of iterative reconstruction technique. COMPARISON:  Concurrent CT head. FINDINGS: CT MAXILLOFACIAL FINDINGS Osseous: No evidence of  maxillofacial fracture. Nasal bones are intact. Mandibles intact. Bilateral mandibular condyles are well-seated in the TMJs. Orbits: Bilateral orbits, including the globes and retroconal soft tissues, are within normal limits.  Sinuses: The visualized paranasal sinuses are essentially clear. The mastoid air cells are unopacified. Soft tissues: Mild soft tissue swelling overlying the right frontal bone, right orbit, and right lateral zygoma/maxilla. Limited intracranial: Evaluated on dedicated CT head. CT CERVICAL FINDINGS Alignment: Normal cervical lordosis. Skull base and vertebrae: No acute fracture. No primary bone lesion or focal pathologic process. Soft tissues and spinal canal: No prevertebral fluid or swelling. No visible canal hematoma. Disc levels: Mild degenerative changes of the mid/lower cervical spine. Spinal canal is patent. Upper chest: Evaluated on dedicated CT chest. Other: None. IMPRESSION: Mild right facial soft tissue swelling. No evidence of maxillofacial fracture. No traumatic injury to the cervical spine. Mild degenerative changes. Electronically Signed   By: Julian Hy M.D.   On: 05/03/2022 18:28        Scheduled Meds:  pantoprazole  40 mg Oral Daily   polyethylene glycol  17 g Oral BID   senna  1 tablet Oral QHS   Continuous Infusions:  sodium chloride 150 mL/hr at 05/05/22 1152     LOS: 2 days    Time spent: over 30 min    Fayrene Helper, MD [facility]   To contact the attending provider between 7A-7P or the covering provider during after hours 7P-7A, please log into the web site [email or web address] and access using universal [place] password for that web site. If you do not have the password, please call the hospital operator.  05/05/2022, 6:10 PM

## 2022-05-05 NOTE — Progress Notes (Signed)
Mobility Specialist Progress Note:   05/05/22 1400  Mobility  Activity Transferred to/from [facility]  Level of Assistance Moderate assist, patient does 50-74% (+2)  Assistive Device Front wheel walker  Distance Ambulated (ft) 2 ft  Activity Response Tolerated fair  $Mobility charge 1 Mobility   Pt requesting to get into chair, however upon standing needed to transfer to [facility] for BM. Pt required modA +2 to complete transfer. Left with NT in room. Will f/u to transfer to chair.   Nelta Numbers Acute Rehab Secure Chat or Office Phone: [phone]

## 2022-05-05 NOTE — TOC Progression Note (Signed)
Transition of Care [facility]) - Progression Note    Patient Details  Name: Johnny [name] MRN: [id number] Date of Birth: 09/09/1941  Transition of Care [facility]) CM/SW [place], [place] Phone Number: 05/05/2022, 2:13 PM  Clinical Narrative:    CSW spoke with pt son who is okay with pt returning to [facility] for SNF. CSW attempted to contact Katie at [facility], no answer CSW will continue to follow for DC planning.    Expected Discharge Plan: [place] Barriers to Discharge: Continued Medical Work up, Other (must enter comment)  Expected Discharge Plan and Services Expected Discharge Plan: [place] Choice: [place] arrangements for the past 2 months: [place]                                       Social Determinants of Health (SDOH) Interventions    Readmission Risk Interventions     View : No data to display.

## 2022-05-05 NOTE — Progress Notes (Signed)
Mobility Specialist Progress Note:   05/05/22 1609  Therapy Vitals  Temp 98.2 F (36.8 C)  Temp Source Oral  Pulse Rate 86  Resp 18  BP (!) 141/85  Patient Position (if appropriate) Lying  Oxygen Therapy  SpO2 100 %  O2 Device Room Air  Mobility  Activity Transferred from chair to bed  Level of Assistance Minimal assist, patient does 75% or more (+2)  Assistive Device Front wheel walker  Distance Ambulated (ft) 2 ft  Activity Response Tolerated well  $Mobility charge 1 Mobility   Pt requesting to transfer back to bed. Required minA+2(safety) to stand and pivot to chair. Pt back in bed with all needs met.   Nelta Numbers Acute Rehab Secure Chat or Office Phone: [phone]

## 2022-05-06 ENCOUNTER — Inpatient Hospital Stay (HOSPITAL_COMMUNITY): Payer: Medicare Other

## 2022-05-06 DIAGNOSIS — N179 Acute kidney failure, unspecified: Secondary | ICD-10-CM

## 2022-05-06 DIAGNOSIS — T796XXA Traumatic ischemia of muscle, initial encounter: Secondary | ICD-10-CM | POA: Diagnosis not present

## 2022-05-06 DIAGNOSIS — R410 Disorientation, unspecified: Secondary | ICD-10-CM

## 2022-05-06 HISTORY — DX: Acute kidney failure, unspecified: N17.9

## 2022-05-06 HISTORY — DX: Disorientation, unspecified: R41.0

## 2022-05-06 LAB — CBC WITH DIFFERENTIAL/PLATELET
Abs Immature Granulocytes: 0.05 10*3/uL (ref 0.00–0.07)
Basophils Absolute: 0 10*3/uL (ref 0.0–0.1)
Basophils Relative: 0 %
Eosinophils Absolute: 0.1 10*3/uL (ref 0.0–0.5)
Eosinophils Relative: 2 %
HCT: 31.2 % — ABNORMAL LOW (ref 39.0–52.0)
Hemoglobin: 10.8 g/dL — ABNORMAL LOW (ref 13.0–17.0)
Immature Granulocytes: 1 %
Lymphocytes Relative: 10 %
Lymphs Abs: 0.6 10*3/uL — ABNORMAL LOW (ref 0.7–4.0)
MCH: 32.5 pg (ref 26.0–34.0)
MCHC: 34.6 g/dL (ref 30.0–36.0)
MCV: 94 fL (ref 80.0–100.0)
Monocytes Absolute: 0.6 10*3/uL (ref 0.1–1.0)
Monocytes Relative: 10 %
Neutro Abs: 4.6 10*3/uL (ref 1.7–7.7)
Neutrophils Relative %: 77 %
Platelets: 124 10*3/uL — ABNORMAL LOW (ref 150–400)
RBC: 3.32 MIL/uL — ABNORMAL LOW (ref 4.22–5.81)
RDW: 12 % (ref 11.5–15.5)
WBC: 6 10*3/uL (ref 4.0–10.5)
nRBC: 0 % (ref 0.0–0.2)

## 2022-05-06 LAB — COMPREHENSIVE METABOLIC PANEL WITH GFR
ALT: 33 U/L (ref 0–44)
AST: 75 U/L — ABNORMAL HIGH (ref 15–41)
Albumin: 2.3 g/dL — ABNORMAL LOW (ref 3.5–5.0)
Alkaline Phosphatase: 52 U/L (ref 38–126)
Anion gap: 5 (ref 5–15)
BUN: 25 mg/dL — ABNORMAL HIGH (ref 8–23)
CO2: 24 mmol/L (ref 22–32)
Calcium: 8.2 mg/dL — ABNORMAL LOW (ref 8.9–10.3)
Chloride: 111 mmol/L (ref 98–111)
Creatinine, Ser: 1.41 mg/dL — ABNORMAL HIGH (ref 0.61–1.24)
GFR, Estimated: 50 mL/min — ABNORMAL LOW
Glucose, Bld: 110 mg/dL — ABNORMAL HIGH (ref 70–99)
Potassium: 3.2 mmol/L — ABNORMAL LOW (ref 3.5–5.1)
Sodium: 140 mmol/L (ref 135–145)
Total Bilirubin: 0.6 mg/dL (ref 0.3–1.2)
Total Protein: 4.4 g/dL — ABNORMAL LOW (ref 6.5–8.1)

## 2022-05-06 LAB — CREATININE, SERUM
Creatinine, Ser: 1.3 mg/dL — ABNORMAL HIGH (ref 0.61–1.24)
GFR, Estimated: 55 mL/min — ABNORMAL LOW (ref >60)

## 2022-05-06 LAB — PHOSPHORUS: Phosphorus: 3.3 mg/dL (ref 2.5–4.6)

## 2022-05-06 LAB — MAGNESIUM: Magnesium: 1.9 mg/dL (ref 1.7–2.4)

## 2022-05-06 LAB — CK: Total CK: 911 U/L — ABNORMAL HIGH (ref 49–397)

## 2022-05-06 NOTE — Progress Notes (Signed)
PROGRESS NOTE    Johnny [name]  KKX:[id number] DOB: 1940-12-22 DOA: 05/03/2022 PCP: [name], MD  Chief Complaint  Patient presents with   Fall   Altered Mental Status    Brief Narrative:  81 year old [facility], was found down by staff for unknown period of time.  Admitted for acute rhabdomyolysis secondary to prolonged immobility, fall, acute metabolic encephalopathy probably from concussion.    Assessment & Plan:   Principal Problem:   Traumatic rhabdomyolysis [facility]) Active Problems:   Fall at home   Concussion   AKI (acute kidney injury) ([place])   Acute metabolic encephalopathy   Traumatic hematoma of right shoulder   Pulmonary nodules   Decubitus ulcer of left foot, stage 1   Constipation   Aortic atherosclerosis (HCC)   Delirium   Assessment and Plan: * Traumatic rhabdomyolysis ([place]) -- Secondary to fall, prolonged immobility -- CK improved significantly, continue IV fluids  Fall at home -- Etiology unclear.  Patient has no memory of the events.  Soft tissue injuries including right shoulder greater than left, right facial hematoma, concussion with memory loss.  Trauma scans negative for acute skeletal injuries. 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 -- Monitor clinically. No definite focal neurologic deficits noted.  AKI (acute kidney injury) ([place]) Creatinine up to 1.41 today Last checked on 5/30 Possibly related to rhabdo? Vs contrast related vs hemodynamically mediate. Of note, UA at presentation with moderate hb, 0-5 RBC's Continue IVF and repeat UA Renal US 5/29 without hydro Will continue to follow  Acute metabolic encephalopathy --Secondary to fall, concussion, acute hospitalization. CT head negative. --per discussion with son, tends to be more confused in AM, more himself in  afternoon --delirium precautions  Traumatic hematoma of right shoulder traumatic frozen shoulder? Vs rotator cuff injury. He'll need outpatient ortho follow up and physical therapy CT notable for soft tissue swelling and ill defined fluid at the posterior aspect of the right shoulder with asymmetric enlargement of the posterior right deltoid muscle, likely post traumatic  Plain films of R shoulder with moderate glenoheumeral generative changes, no acute osseus findings Very limited range of movement.  TTP over deltoid.  Otherwise, without significant TTP.  Will get [place] to eval for intramuscular hematoma.  Pulmonary nodules --Multiple pulmonary nodules including Montanna Mcbain 10 mm part solid subpleural nodule in the posterior right lower lobe, present on the previous exam, although solid component appears slightly increased. Indolent neoplasm such as adenocarcinoma is not excluded. Consider one of the following in 3 months for both low-risk and high-risk individuals: ([place]on [place]) repeat chest CT, (b) follow-up PET-CT, or (c) tissue sampling. --HOWEVER, see Dr. Cruz Condon note 11/2021 pulmonology; per son he was told these were stable; therefore just follow-up with Dr. Camillo Flaming.  Decubitus ulcer of left foot, stage 1 --Present on admission.  Pressure injury secondary to prolonged immobility.  Wound consult.   Aortic atherosclerosis ([place]) --noted, follow, on aspirin/lipitor  Constipation --seen on CT. asymptomatic.  Bowel regimen.     DVT prophylaxis: SCD Code Status: full Family Communication: son and brother at bedside Disposition:   Status is: Inpatient Remains inpatient appropriate because: need to follow CK, R arm pain   Consultants:  none  Procedures:  none  Antimicrobials:  Anti-infectives (From admission, onward)    Start     Dose/Rate Route Frequency Ordered Stop   05/03/22 1900  cefTRIAXone (ROCEPHIN) 1 g in sodium chloride 0.9 % 100 mL IVPB        1 g 200 mL/hr over 30 Minutes Intravenous   Once 05/03/22 1852 05/03/22 2013   05/03/22 1900  azithromycin (ZITHROMAX) 500 mg in sodium chloride 0.9 % 250 mL IVPB        500 mg 250 mL/hr over 60 Minutes Intravenous  Once 05/03/22 1852 05/04/22 0139       Subjective: No new complaints Mildly confused, sleepy this AM Son and brother at bedside  Objective: Vitals:   05/05/22 1609 05/05/22 2017 05/06/22 0455 05/06/22 0726  BP: (!) 141/85 126/74 (!) 141/73 112/67  Pulse: 86 76 77 77  Resp: 18 (!) '22 16 18  '$ Temp: 98.2 F (36.8 C) 98 F (36.7 C) 98 F (36.7 C) 97.8 F (36.6 C)  TempSrc: Oral Oral Oral Oral  SpO2: 100% 99% 98% 100%  Weight:    71.6 kg  Height:        Intake/Output Summary (Last 24 hours) at 05/06/2022 1346 Last data filed at 05/06/2022 0742 Gross per 24 hour  Intake 240 ml  Output 1250 ml  Net -1010 ml   Filed Weights   05/05/22 0538 05/05/22 0808 05/06/22 0726  Weight: 67.8 kg 68.6 kg 71.6 kg    Examination:  General: No acute distress. Cardiovascular: RRR Lungs: unlabored Abdomen: Soft, nontender, nondistended  Neurological: Alert, sleepy at times. Moves all extremities 4 . Cranial nerves II through XII grossly intact. MSK limited ROM of right shoulder, TTP over deltoid near abrasion Extremities: No clubbing or cyanosis. No edema.   Data Reviewed: I have personally reviewed following labs and imaging studies  CBC: Recent Labs  Lab 05/03/22 1616 05/04/22 0010 05/06/22 0618  WBC 11.1* 9.4 6.0  NEUTROABS 9.4* 7.6 4.6  HGB 15.0 13.1 10.8*  HCT 43.6 37.8* 31.2*  MCV 93.2 93.8 94.0  PLT 157 153 124*    Basic Metabolic Panel: Recent Labs  Lab 05/03/22 1616 05/04/22 0010 05/06/22 0618  NA 142 142 140  K 3.5 3.2* 3.2*  CL 108 106 111  CO2 '28 25 24  '$ GLUCOSE 111* 94 110*  BUN 37* 31* 25*  CREATININE 1.00 0.90 1.41*  CALCIUM 9.3 8.9 8.2*  MG  --   --  1.9  PHOS  --   --  3.3    GFR: Estimated Creatinine Clearance: 40.9 mL/min (Safiyya Stokes) (by C-G formula based on SCr of 1.41 mg/dL  (H)).  Liver Function Tests: Recent Labs  Lab 05/03/22 1616 05/04/22 0010 05/06/22 0618  AST 203* 165* 75*  ALT 44 40 33  ALKPHOS 62 53 52  BILITOT 1.3* 1.0 0.6  PROT 6.2* 5.4* 4.4*  ALBUMIN 3.4* 2.9* 2.3*    CBG: No results for input(s): GLUCAP in the last 168 hours.   Recent Results (from the past 240 hour(s))  Blood culture (routine x 2)     Status: None (Preliminary result)   Collection Time: 05/03/22  7:08 PM   Specimen: BLOOD RIGHT HAND  Result Value Ref Range Status   Specimen Description BLOOD RIGHT HAND  Final   Special Requests   Final    BOTTLES DRAWN AEROBIC AND ANAEROBIC Blood Culture adequate volume   Culture   Final    NO GROWTH 2 DAYS Performed at [facility], [address], [place], [place] [address]    Report Status PENDING  Incomplete  Blood culture (routine x 2)     Status: None (  Preliminary result)   Collection Time: 05/03/22  7:08 PM   Specimen: BLOOD  Result Value Ref Range Status   Specimen Description BLOOD RIGHT ANTECUBITAL  Final   Special Requests   Final    BOTTLES DRAWN AEROBIC AND ANAEROBIC Blood Culture results may not be optimal due to an excessive volume of blood received in culture bottles   Culture   Final    NO GROWTH 2 DAYS Performed at [facility] [address], [place], [place] [address]    Report Status PENDING  Incomplete         Radiology Studies: DG Shoulder Right Port  Result Date: 05/04/2022 CLINICAL DATA:  Right shoulder pain and bruising. EXAM: RIGHT SHOULDER - 1 VIEW COMPARISON:  CT chest 05/03/2022. FINDINGS: The mineralization and alignment are normal. There is no evidence of acute fracture or dislocation. There are moderate glenohumeral degenerative changes. There is surrounding soft tissues appear unremarkable. IMPRESSION: Moderate glenohumeral degenerative changes. No acute osseous findings. Electronically Signed   By: Richardean Sale M.D.   On: 05/04/2022 14:16        Scheduled Meds:   pantoprazole  40 mg Oral Daily   polyethylene glycol  17 g Oral BID   senna  1 tablet Oral QHS   Continuous Infusions:  sodium chloride 150 mL/hr at 05/06/22 0056     LOS: 3 days    Time spent: over 30 min    Fayrene Helper, MD [facility]   To contact the attending provider between 7A-7P or the covering provider during after hours 7P-7A, please log into the web site [email or web address] and access using universal [place] password for that web site. If you do not have the password, please call the hospital operator.  05/06/2022, 1:46 PM

## 2022-05-06 NOTE — Care Management Important Message (Signed)
Important Message  Patient Details  Name: Johnny [name] MRN: [id number] Date of Birth: October 28, 1941   Medicare Important Message Given:  Yes     Orbie Pyo 05/06/2022, 2:15 PM

## 2022-05-06 NOTE — Assessment & Plan Note (Addendum)
Improved on day of discharge Last checked on 5/30 Possibly related to rhabdo? Vs contrast related vs hemodynamically mediate. Of note, UA at presentation with moderate hb, 0-5 RBC's Continue IVF and repeat UA (small Hb and 0-5 RBC's) Renal US 5/29 without hydro

## 2022-05-06 NOTE — TOC Progression Note (Signed)
Transition of Care [facility]) - Progression Note    Patient Details  Name: Johnny [name] MRN: [id number] Date of Birth: 08/09/1941  Transition of Care [facility]) CM/SW Contact  Reece Agar, [place] Phone Number: 05/06/2022, 12:43 PM  Clinical Narrative:    Pt not medically stable, will dc to [place] when stable.   Expected Discharge Plan: [place] Barriers to Discharge: Continued Medical Work up, Other (must enter comment)  Expected Discharge Plan and Services Expected Discharge Plan: [place] Choice: [place] arrangements for the past 2 months: [place]                                       Social Determinants of Health (SDOH) Interventions    Readmission Risk Interventions     View : No data to display.

## 2022-05-06 NOTE — Progress Notes (Signed)
Physical Therapy Treatment Patient Details Name: Johnny [name] MRN: [id number] DOB: February 07, 1941 Today's Date: 05/06/2022   History of Present Illness Pt is an 81yo M admitted 05/03/22 from independent living after being found on the floor by staff. rhabdomyolosis present. CT of head, abdomen, and pelvis negative. PMH: HLD, GERD, prostate cancer    PT Comments    Pt progressing slowly towards physical therapy goals. Was able to perform transfers with increased assist required, and Stedy utilized for added safety and stability for pt. Overall posture appears more flexed and pt with continued difficulty sequencing and initiating movement. UE's appear less rigid today. Discussed with NT who reports pt is requiring more assist for OOB compared to last couple days, which is consistent with my assessment today compared to evaluation 5/30. SNF remains the most appropriate d/c disposition at this time. Will continue to follow.     Recommendations for follow up therapy are one component of a multi-disciplinary discharge planning process, led by the attending physician.  Recommendations may be updated based on patient status, additional functional criteria and insurance authorization.  Follow Up Recommendations  Skilled nursing-short term rehab (<3 hours/day)     Assistance Recommended at Discharge Frequent or constant Supervision/Assistance  Patient can return home with the following Two people to help with walking and/or transfers;Two people to help with bathing/dressing/bathroom;Assistance with cooking/housework;Direct supervision/assist for medications management;Direct supervision/assist for financial management;Assist for transportation;Help with stairs or ramp for entrance   Equipment Recommendations  Rolling walker (2 wheels)    Recommendations for Other Services       Precautions / Restrictions Precautions Precautions: Fall Restrictions Weight Bearing Restrictions: No     Mobility   Bed Mobility Overal bed mobility: Needs Assistance Bed Mobility: Supine to Sit, Sit to Supine     Supine to sit: Mod assist Sit to supine: Max assist   General bed mobility comments: Increased assist required for transition to/from EOB. Pt moving slow with difficulty initiating movement.    Transfers Overall transfer level: Needs assistance Equipment used: Rolling walker (2 wheels) Transfers: Sit to/from Stand Sit to Stand: Mod assist           General transfer comment: Pt reports feeling fatigued so Stedy utilized for added support/safety with sit<>stands. In [place] pt was able to demonstrate pre-gait activity and therapeutic exercise. Pt requests back to bed at end of session.    Ambulation/Gait             Pre-gait activities: Weight shifting R and L x10 each side; squats to Stedy hip supports and back to full stand 5x2.     Stairs             Wheelchair Mobility    Modified Rankin (Stroke Patients Only)       Balance Overall balance assessment: Needs assistance Sitting-balance support: Bilateral upper extremity supported, Feet supported Sitting balance-Leahy Scale: Fair     Standing balance support: Reliant on assistive device for balance Standing balance-Leahy Scale: Poor Standing balance comment: requiring consistent manual assist to maintain balance along with reliance on RW                            Cognition Arousal/Alertness: Awake/alert Behavior During Therapy: WFL for tasks assessed/performed Overall Cognitive Status: Impaired/Different from baseline Area of Impairment: Orientation, Attention, Memory, Following commands, Safety/judgement, Awareness, Problem solving                 Orientation  Level: Disoriented to, Time, Situation Current Attention Level: Sustained Memory: Decreased short-term memory Following Commands: Follows one step commands inconsistently, Follows one step commands with increased  time Safety/Judgement: Decreased awareness of safety Awareness: Intellectual Problem Solving: Slow processing, Difficulty sequencing, Requires verbal cues, Requires tactile cues, Decreased initiation          Exercises Shoulder Exercises Elbow Flexion: Right, 20 reps Elbow Extension: Right, 20 reps    General Comments General comments (skin integrity, edema, etc.): Pt asking therapist to look at his R eye, stating "it's not right." Pt denies blurred vision, however reports concern for scab near R eye from fall and asks if it looks like it will heal well.      Pertinent Vitals/Pain Pain Assessment Pain Assessment: Faces Faces Pain Scale: Hurts little more Pain Location: R shoulder Pain Descriptors / Indicators: Discomfort, Sore Pain Intervention(s): Limited activity within patient's tolerance, Monitored during session, Repositioned    Home Living                          Prior Function            PT Goals (current goals can now be found in the care plan section) Acute Rehab PT Goals Patient Stated Goal: to reduce pain PT Goal Formulation: With patient Time For Goal Achievement: 05/18/22 Potential to Achieve Goals: Good Progress towards PT goals: Progressing toward goals    Frequency    Min 3X/week      PT Plan Current plan remains appropriate    Co-evaluation              AM-PAC PT "6 Clicks" Mobility   Outcome Measure  Help needed turning from your back to your side while in a flat bed without using bedrails?: A Lot Help needed moving from lying on your back to sitting on the side of a flat bed without using bedrails?: A Lot Help needed moving to and from a bed to a chair (including a wheelchair)?: A Lot Help needed standing up from a chair using your arms (e.g., wheelchair or bedside chair)?: A Lot Help needed to walk in hospital room?: Total Help needed climbing 3-5 steps with a railing? : Total 6 Click Score: 10    End of Session  Equipment Utilized During Treatment: Gait belt Activity Tolerance: Patient limited by fatigue;Patient limited by pain Patient left: in bed;with call bell/phone within reach;with bed alarm set Nurse Communication: Mobility status PT Visit Diagnosis: Unsteadiness on feet (R26.81);Other abnormalities of gait and mobility (R26.89);Muscle weakness (generalized) (M62.81);Pain Pain - Right/Left: Right Pain - part of body: Shoulder;Hip     Time: 6599-3570 PT Time Calculation (min) (ACUTE ONLY): 31 min  Charges:  $Gait Training: 8-22 mins $Therapeutic Activity: 8-22 mins                     Rolinda Roan, PT, DPT Acute Rehabilitation Services Secure Chat Preferred Office: [phone]    Thelma Comp 05/06/2022, 4:20 PM

## 2022-05-06 NOTE — Progress Notes (Signed)
Occupational Therapy Treatment Patient Details Name: Johnny [name] MRN: [id number] DOB: 02/15/1941 Today's Date: 05/06/2022   History of present illness Pt is an 81yo M admitted 05/03/22 from independent living after being found on the floor by staff. rhabdomyolosis present. CT of head, abdomen, and pelvis negative. PMH: HLD, GERD, prostate cancer   OT comments  Pt admitted with at this time was able to complete bed mobility from supine to sitting with moderate assistance, sit to stand transfers with mod assistance from all surfaces with a standing tolerance about 1 min 15 seconds. Pt in session and family educated on to work on positioning to increase in pelvic tilt when in a standing position. Pt reported feeling fatigued and could not do any further activity in this session.    Recommendations for follow up therapy are one component of a multi-disciplinary discharge planning process, led by the attending physician.  Recommendations may be updated based on patient status, additional functional criteria and insurance authorization.    Follow Up Recommendations  Skilled nursing-short term rehab (<3 hours/day)    Assistance Recommended at Discharge Set up Supervision/Assistance  Patient can return home with the following  A lot of help with walking and/or transfers;A lot of help with bathing/dressing/bathroom   Equipment Recommendations  BSC/3in1;Wheelchair (measurements OT);Wheelchair cushion (measurements OT);Hospital bed    Recommendations for Other Services      Precautions / Restrictions Precautions Precautions: Fall Restrictions Weight Bearing Restrictions: No       Mobility Bed Mobility Overal bed mobility: Needs Assistance Bed Mobility: Supine to Sit     Supine to sit: Mod assist          Transfers Overall transfer level: Needs assistance Equipment used: Rolling walker (2 wheels) Transfers: Sit to/from Stand Sit to Stand: Mod assist     Step pivot  transfers: Mod assist     General transfer comment: Pt completed 3 sit to stand transfers     Balance Overall balance assessment: Needs assistance Sitting-balance support: Bilateral upper extremity supported, Feet supported Sitting balance-Leahy Scale: Fair     Standing balance support: Reliant on assistive device for balance Standing balance-Leahy Scale: Poor Standing balance comment: requiring consistent manual assist to maintain balance along with reliance on RW                           ADL either performed or assessed with clinical judgement   ADL Overall ADL's : Needs assistance/impaired Eating/Feeding: Set up;Sitting   Grooming: Wash/dry hands;Set up;Sitting   Upper Body Bathing: Moderate assistance;Sitting   Lower Body Bathing: Maximal assistance;Sit to/from stand   Upper Body Dressing : Moderate assistance;Cueing for safety;Cueing for sequencing;Sitting   Lower Body Dressing: Maximal assistance;Sit to/from stand   Toilet Transfer: Moderate assistance;Cueing for safety;Cueing for sequencing;Rolling walker (2 wheels)   Toileting- Clothing Manipulation and Hygiene: Total assistance;Sit to/from stand       Functional mobility during ADLs: Moderate assistance;Rolling walker (2 wheels) General ADL Comments: guiding FW with all mobilty    Extremity/Trunk Assessment Upper Extremity Assessment Upper Extremity Assessment: Generalized weakness RUE Deficits / Details: ridigity in RUE   Lower Extremity Assessment Lower Extremity Assessment: Defer to PT evaluation        Vision       Perception     Praxis      Cognition Arousal/Alertness: Awake/alert Behavior During Therapy: WFL for tasks assessed/performed Overall Cognitive Status: Impaired/Different from baseline Area of Impairment: Orientation, Attention, Memory, Following  commands, Safety/judgement, Awareness, Problem solving                 Orientation Level: Disoriented to, Place,  Time, Situation Current Attention Level: Sustained Memory: Decreased short-term memory Following Commands: Follows one step commands inconsistently Safety/Judgement: Decreased awareness of safety Awareness: Emergent Problem Solving: Slow processing          Exercises      Shoulder Instructions       General Comments      Pertinent Vitals/ Pain       Pain Assessment Pain Assessment: Faces Faces Pain Scale: Hurts a little bit Pain Location: LLE Pain Descriptors / Indicators: Discomfort Pain Intervention(s): Monitored during session  Home Living                                          Prior Functioning/Environment              Frequency  Min 2X/week        Progress Toward Goals  OT Goals(current goals can now be found in the care plan section)  Progress towards OT goals: Progressing toward goals  Acute Rehab OT Goals Patient Stated Goal: to continue to get stronger OT Goal Formulation: With patient Time For Goal Achievement: 05/18/22 Potential to Achieve Goals: Good ADL Goals Pt Will Transfer to Toilet: with supervision;bedside commode;stand pivot transfer Pt/caregiver will Perform Home Exercise Program: Increased ROM;Increased strength;Right Upper extremity;With minimal assist;With written HEP provided Additional ADL Goal #1: pt wil complete bed mobility min (A) as precursor to adls. Additional ADL Goal #2: pt will complete seated adl min guard (A)  Plan Discharge plan remains appropriate    Co-evaluation                 AM-PAC OT "6 Clicks" Daily Activity     Outcome Measure   Help from another person eating meals?: A Little Help from another person taking care of personal grooming?: A Little Help from another person toileting, which includes using toliet, bedpan, or urinal?: A Lot Help from another person bathing (including washing, rinsing, drying)?: A Lot Help from another person to put on and taking off regular upper  body clothing?: A Lot Help from another person to put on and taking off regular lower body clothing?: A Lot 6 Click Score: 14    End of Session Equipment Utilized During Treatment: Gait belt;Rolling walker (2 wheels)  OT Visit Diagnosis: Unsteadiness on feet (R26.81);Other abnormalities of gait and mobility (R26.89);Repeated falls (R29.6);Muscle weakness (generalized) (M62.81);Pain Pain - part of body:  (general aching)   Activity Tolerance Patient tolerated treatment well   Patient Left in chair;with call bell/phone within reach;with chair alarm set;with family/visitor present   Nurse Communication Mobility status (bed needs changed)        Time: 3149-7026 OT Time Calculation (min): 35 min  Charges: OT General Charges $OT Visit: 1 Visit OT Treatments $Self Care/Home Management : 23-37 mins  Joeseph Amor OTR/L  Acute Rehab Services  [phone] office number [phone] pager number   Joeseph Amor 05/06/2022, 12:17 PM

## 2022-05-07 DIAGNOSIS — Z87448 Personal history of other diseases of urinary system: Secondary | ICD-10-CM | POA: Diagnosis not present

## 2022-05-07 DIAGNOSIS — T796XXS Traumatic ischemia of muscle, sequela: Secondary | ICD-10-CM | POA: Diagnosis not present

## 2022-05-07 DIAGNOSIS — G9341 Metabolic encephalopathy: Secondary | ICD-10-CM | POA: Diagnosis not present

## 2022-05-07 DIAGNOSIS — S060X9S Concussion with loss of consciousness of unspecified duration, sequela: Secondary | ICD-10-CM | POA: Diagnosis not present

## 2022-05-07 DIAGNOSIS — E876 Hypokalemia: Secondary | ICD-10-CM | POA: Diagnosis not present

## 2022-05-07 DIAGNOSIS — G253 Myoclonus: Secondary | ICD-10-CM | POA: Diagnosis not present

## 2022-05-07 DIAGNOSIS — E43 Unspecified severe protein-calorie malnutrition: Secondary | ICD-10-CM | POA: Diagnosis not present

## 2022-05-07 DIAGNOSIS — R918 Other nonspecific abnormal finding of lung field: Secondary | ICD-10-CM | POA: Diagnosis not present

## 2022-05-07 DIAGNOSIS — E785 Hyperlipidemia, unspecified: Secondary | ICD-10-CM | POA: Diagnosis not present

## 2022-05-07 DIAGNOSIS — Z7401 Bed confinement status: Secondary | ICD-10-CM | POA: Diagnosis not present

## 2022-05-07 DIAGNOSIS — R262 Difficulty in walking, not elsewhere classified: Secondary | ICD-10-CM

## 2022-05-07 DIAGNOSIS — G20 Parkinson's disease: Secondary | ICD-10-CM | POA: Diagnosis not present

## 2022-05-07 DIAGNOSIS — R253 Fasciculation: Secondary | ICD-10-CM | POA: Diagnosis not present

## 2022-05-07 DIAGNOSIS — R29898 Other symptoms and signs involving the musculoskeletal system: Secondary | ICD-10-CM

## 2022-05-07 DIAGNOSIS — I69828 Other speech and language deficits following other cerebrovascular disease: Secondary | ICD-10-CM | POA: Diagnosis not present

## 2022-05-07 DIAGNOSIS — I10 Essential (primary) hypertension: Secondary | ICD-10-CM | POA: Diagnosis not present

## 2022-05-07 DIAGNOSIS — R2689 Other abnormalities of gait and mobility: Secondary | ICD-10-CM | POA: Diagnosis not present

## 2022-05-07 DIAGNOSIS — M542 Cervicalgia: Secondary | ICD-10-CM | POA: Diagnosis not present

## 2022-05-07 DIAGNOSIS — N179 Acute kidney failure, unspecified: Secondary | ICD-10-CM | POA: Diagnosis not present

## 2022-05-07 DIAGNOSIS — F5101 Primary insomnia: Secondary | ICD-10-CM | POA: Diagnosis not present

## 2022-05-07 DIAGNOSIS — R296 Repeated falls: Secondary | ICD-10-CM | POA: Diagnosis not present

## 2022-05-07 DIAGNOSIS — M25552 Pain in left hip: Secondary | ICD-10-CM | POA: Diagnosis not present

## 2022-05-07 DIAGNOSIS — S40011D Contusion of right shoulder, subsequent encounter: Secondary | ICD-10-CM | POA: Diagnosis not present

## 2022-05-07 DIAGNOSIS — K117 Disturbances of salivary secretion: Secondary | ICD-10-CM | POA: Diagnosis not present

## 2022-05-07 DIAGNOSIS — M6282 Rhabdomyolysis: Secondary | ICD-10-CM | POA: Diagnosis not present

## 2022-05-07 DIAGNOSIS — I69891 Dysphagia following other cerebrovascular disease: Secondary | ICD-10-CM | POA: Diagnosis not present

## 2022-05-07 DIAGNOSIS — M25551 Pain in right hip: Secondary | ICD-10-CM | POA: Diagnosis not present

## 2022-05-07 DIAGNOSIS — R413 Other amnesia: Secondary | ICD-10-CM | POA: Diagnosis not present

## 2022-05-07 DIAGNOSIS — W19XXXS Unspecified fall, sequela: Secondary | ICD-10-CM | POA: Diagnosis not present

## 2022-05-07 DIAGNOSIS — K219 Gastro-esophageal reflux disease without esophagitis: Secondary | ICD-10-CM | POA: Diagnosis not present

## 2022-05-07 DIAGNOSIS — K5901 Slow transit constipation: Secondary | ICD-10-CM | POA: Diagnosis not present

## 2022-05-07 DIAGNOSIS — T796XXA Traumatic ischemia of muscle, initial encounter: Secondary | ICD-10-CM | POA: Diagnosis not present

## 2022-05-07 DIAGNOSIS — I700 Atherosclerosis of aorta: Secondary | ICD-10-CM | POA: Diagnosis not present

## 2022-05-07 HISTORY — DX: Other symptoms and signs involving the musculoskeletal system: R29.898

## 2022-05-07 HISTORY — DX: Difficulty in walking, not elsewhere classified: R26.2

## 2022-05-07 LAB — COMPREHENSIVE METABOLIC PANEL
ALT: 32 U/L (ref 0–44)
AST: 79 U/L — ABNORMAL HIGH (ref 15–41)
Albumin: 2.3 g/dL — ABNORMAL LOW (ref 3.5–5.0)
Alkaline Phosphatase: 54 U/L (ref 38–126)
Anion gap: 6 (ref 5–15)
BUN: 17 mg/dL (ref 8–23)
CO2: 25 mmol/L (ref 22–32)
Calcium: 8.2 mg/dL — ABNORMAL LOW (ref 8.9–10.3)
Chloride: 110 mmol/L (ref 98–111)
Creatinine, Ser: 1.26 mg/dL — ABNORMAL HIGH (ref 0.61–1.24)
GFR, Estimated: 57 mL/min — ABNORMAL LOW (ref >60)
Glucose, Bld: 113 mg/dL — ABNORMAL HIGH (ref 70–99)
Potassium: 3 mmol/L — ABNORMAL LOW (ref 3.5–5.1)
Sodium: 141 mmol/L (ref 135–145)
Total Bilirubin: 0.9 mg/dL (ref 0.3–1.2)
Total Protein: 4.7 g/dL — ABNORMAL LOW (ref 6.5–8.1)

## 2022-05-07 LAB — URINALYSIS, ROUTINE W REFLEX MICROSCOPIC
Bacteria, UA: NONE SEEN
Bilirubin Urine: NEGATIVE
Glucose, UA: NEGATIVE mg/dL
Ketones, ur: NEGATIVE mg/dL
Leukocytes,Ua: NEGATIVE
Nitrite: NEGATIVE
Protein, ur: NEGATIVE mg/dL
Specific Gravity, Urine: 1.008 (ref 1.005–1.030)
pH: 5 (ref 5.0–8.0)

## 2022-05-07 LAB — CBC WITH DIFFERENTIAL/PLATELET
Abs Immature Granulocytes: 0.06 10*3/uL (ref 0.00–0.07)
Basophils Absolute: 0 10*3/uL (ref 0.0–0.1)
Basophils Relative: 0 %
Eosinophils Absolute: 0.1 10*3/uL (ref 0.0–0.5)
Eosinophils Relative: 1 %
HCT: 31.6 % — ABNORMAL LOW (ref 39.0–52.0)
Hemoglobin: 11.3 g/dL — ABNORMAL LOW (ref 13.0–17.0)
Immature Granulocytes: 1 %
Lymphocytes Relative: 11 %
Lymphs Abs: 0.8 10*3/uL (ref 0.7–4.0)
MCH: 33.4 pg (ref 26.0–34.0)
MCHC: 35.8 g/dL (ref 30.0–36.0)
MCV: 93.5 fL (ref 80.0–100.0)
Monocytes Absolute: 0.7 10*3/uL (ref 0.1–1.0)
Monocytes Relative: 11 %
Neutro Abs: 5.2 10*3/uL (ref 1.7–7.7)
Neutrophils Relative %: 76 %
Platelets: 127 10*3/uL — ABNORMAL LOW (ref 150–400)
RBC: 3.38 MIL/uL — ABNORMAL LOW (ref 4.22–5.81)
RDW: 12 % (ref 11.5–15.5)
WBC: 6.8 10*3/uL (ref 4.0–10.5)
nRBC: 0 % (ref 0.0–0.2)

## 2022-05-07 LAB — PHOSPHORUS: Phosphorus: 3.5 mg/dL (ref 2.5–4.6)

## 2022-05-07 LAB — CK
Total CK: 1187 U/L — ABNORMAL HIGH (ref 49–397)
Total CK: 1106 U/L — ABNORMAL HIGH (ref 49–397)

## 2022-05-07 LAB — MAGNESIUM: Magnesium: 2 mg/dL (ref 1.7–2.4)

## 2022-05-07 MED ORDER — POTASSIUM CHLORIDE CRYS ER 20 MEQ PO TBCR
40.0000 meq | EXTENDED_RELEASE_TABLET | ORAL | Status: AC
  Administered 2022-05-07 (×2): 40 meq via ORAL
  Filled 2022-05-07 (×2): qty 2

## 2022-05-07 MED ORDER — OXYCODONE HCL 5 MG PO TABS
5.0000 mg | ORAL_TABLET | ORAL | Status: DC | PRN
  Administered 2022-05-07: 5 mg via ORAL
  Filled 2022-05-07: qty 1

## 2022-05-07 MED ORDER — LIDOCAINE 5 % EX PTCH
1.0000 | MEDICATED_PATCH | CUTANEOUS | Status: DC
  Administered 2022-05-07: 1 via TRANSDERMAL
  Filled 2022-05-07: qty 1

## 2022-05-07 MED ORDER — OXYCODONE HCL 5 MG PO TABS
5.0000 mg | ORAL_TABLET | Freq: Once | ORAL | Status: AC
  Administered 2022-05-07: 5 mg via ORAL
  Filled 2022-05-07: qty 1

## 2022-05-07 NOTE — Progress Notes (Signed)
At [place], updated Neomia Glass, NP of SBP trend of patient of 170-180, patient is in pain when BP were taken, medication for pain administered, also updated NP of level of orientation of [patient that waxes and wanes, easily redirected and re-oriented to place and time as patient thought he was in Assisted living facility.

## 2022-05-07 NOTE — Assessment & Plan Note (Signed)
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

## 2022-05-07 NOTE — Assessment & Plan Note (Addendum)
Noted on exam today, he notes discomfort to left hip with movement of this leg Suspect weakness related to this pain (lower suspicion for stroke given more likely explanation with his hx fall being found down and pain).   Discussed with radiology who did not see any concerning findings to pelvis/hip on L. Continue to follow outpatient with therapy and pain control

## 2022-05-07 NOTE — TOC Transition Note (Signed)
Transition of Care [facility]) - CM/SW Discharge Note   Patient Details  Name: Johnny [name] MRN: [id number] Date of Birth: [birth year]  Transition of Care [facility] CM/SW Contact:  Tresa Endo Phone Number: 05/07/2022, 1:09 PM   Clinical Narrative:    Per MD patient ready for DC to [place] ([address]). RN, patient, patient's family, and facility notified of DC. Discharge Summary and FL2 sent to facility. DC packet on chart. Ambulance transport requested for patient.    RN to call report to [phone]  CSW will sign off for now as social work intervention is no longer needed. Please consult [place] again if new needs arise.     Barriers to Discharge: Continued Medical Work up, Other (must enter comment)   Patient Goals and CMS Choice        Discharge Placement                       Discharge Plan and Services     Post Acute Care Choice: Skilled Nursing Facility                               Social Determinants of Health (SDOH) Interventions     Readmission Risk Interventions     View : No data to display.

## 2022-05-07 NOTE — Discharge Summary (Addendum)
Physician Discharge Summary  Johnny [name] HCW:[id number] DOB: November 08, 1941 DOA: 05/03/2022  PCP: [name], MD  Admit date: 05/03/2022 Discharge date: 05/07/2022  Time spent: 40 minutes  Recommendations for Outpatient Follow-up:  Follow outpatient CBC/CMP and CK within 3 days Hold lipitor until CK normalizes Concern for parkinson's -> neurology referral, follow with Johnny [name] neurology Concern for right shoulder pain, limited range of motion - rotator cuff injury vs frozen shoulder, etc - follow with orthopedics LLE weakness due to pain, follow outpatient for improvement Needs follow up of pulm nodules outpatient -> repeat CT, PET, tissue sampling within 3 months - follow with Dr. Camillo [name] Follow encephalopathy, delirium outpatient - delirium precautions   Discharge Diagnoses:  Principal Problem:   Traumatic rhabdomyolysis [name]) Active Problems:   Fall at home   Concussion   AKI (acute kidney injury) ([name])   Acute metabolic encephalopathy   Traumatic hematoma of right shoulder   Pulmonary nodules   Decubitus ulcer of left foot, stage 1   Constipation   Aortic atherosclerosis (HCC)   Weakness of left lower extremity   Ambulatory dysfunction   Delirium   Discharge Condition: stable  Diet recommendation: heart healthy  Filed Weights   05/05/22 0808 05/06/22 0726 05/07/22 0448  Weight: 68.6 kg 71.6 kg 73 kg    History of present illness:  81 year old [facility], was found down by staff for unknown period of time.  Admitted for acute rhabdomyolysis secondary to prolonged immobility, fall, acute metabolic encephalopathy probably from concussion and acute hospitalization.  Hospitalization complicated by mild AKI.    He's improved with conservative care, IVF, etc.  CK improved overall at day of discharge.  Plan for discharge with outpatient follow up.    Hospital Course:  Assessment and Plan: * Traumatic rhabdomyolysis (Johnny [name]) --  Secondary to fall, prolonged immobility -- CK improved -> follow outpatient, continue to encourage appropriate hydration -- hold lipitor until CK normalizes  Fall at home -- Etiology unclear - sounds like falling more frequently in past few Months (parkinson's concern?).  Patient has no memory of the events.  Soft tissue injuries including right shoulder greater than left, right facial hematoma, concussion with memory loss.  Trauma scans negative for acute skeletal injuries. 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 -- Monitor clinically. No focal neurologic deficits noted (left leg weakness noted today, but this is due to hip pain/discomfort - no need for MRI at this point without concerning neuro findings). -- delirium precautions -- follow with neurology outpatientx  AKI (acute kidney injury) (Johnny [name]) Improved on day of discharge Last checked on 5/30 Possibly related to rhabdo? Vs contrast related vs hemodynamically mediate. Of note, UA at presentation with moderate hb, 0-5 RBC's Continue IVF and repeat UA (small Hb and 0-5 RBC's) Renal US 5/29 without hydro  Acute metabolic encephalopathy --Secondary to fall, concussion, acute hospitalization. CT head negative. --per discussion with son, tends to be more confused in AM, more himself in afternoon --delirium precautions  Traumatic hematoma of right shoulder traumatic frozen shoulder? Vs rotator cuff injury. He'll need outpatient ortho follow up and physical therapy CT notable for soft tissue swelling and ill defined fluid at the posterior aspect of the right shoulder with asymmetric enlargement of the posterior right deltoid muscle, likely post traumatic  Plain films of R shoulder with moderate glenoheumeral generative changes, no acute osseus findings  Very limited range of movement.  TTP over deltoid.  Otherwise, without significant  TTP.  Will get [place] to eval for intramuscular hematoma (negative)  Pulmonary nodules --Multiple pulmonary nodules including Johnny [name] 10 mm part solid subpleural nodule in the posterior right lower lobe, present on the previous exam, although solid component appears slightly increased. Indolent neoplasm such as adenocarcinoma is not excluded. Consider one of the following in 3 months for both low-risk and high-risk individuals: (Johnny [name]) repeat chest CT, (b) follow-up PET-CT, or (c) tissue sampling. --follow-up with Dr. Camillo [name] as outpatient  Decubitus ulcer of left foot, stage 1 --Present on admission.  Pressure injury secondary to prolonged immobility.  --follow closely outpatient --offloading  Weakness of left lower extremity Noted on exam today, he notes discomfort to left hip with movement of this leg Suspect weakness related to this pain (lower suspicion for stroke given more likely explanation with his hx fall being found down and pain).   Discussed with radiology who did not see any concerning findings to pelvis/hip on L. Continue to follow outpatient with therapy and pain control   Aortic atherosclerosis (Johnny [name]) --noted, follow, on aspirin/lipitor (lipitor currently on hold)  Constipation --seen on CT. asymptomatic.  Bowel regimen.  Ambulatory dysfunction Some concern for parkinson's - reported shuffling gait, rigidity/stiffness Will refer to neurology outpatient Continue PT outpatient, follow with PCP/neuro       Procedures: none   Consultations: none  Discharge Exam: Vitals:   05/07/22 0448 05/07/22 0732  BP: (!) 172/82 128/72  Pulse:  75  Resp: (!) 25 17  Temp:  98 F (36.7 C)  SpO2:  99%   No new complaints other than LLE weakness related to L hip pain Discussed d/c plan with son at bedside  General: No acute distress. Abrasion to R face near eye Cardiovascular: RRR Lungs: unlabored Abdomen: Soft, nontender, nondistended  Neurological: CN 2-12 intact, 5/5 strength to  upper extremity (some limitation to R shoulder due to pain), 4/5 to LLE when lifting heel off bed due to pain at hip.  No sensory deficits.   Extremities: No clubbing or cyanosis. No edema.   Discharge Instructions   Discharge Instructions     Ambulatory referral to Neurology   Complete by: As directed    An appointment is requested in approximately: 4 weeks   Call MD for:  difficulty breathing, headache or visual disturbances   Complete by: As directed    Call MD for:  extreme fatigue   Complete by: As directed    Call MD for:  hives   Complete by: As directed    Call MD for:  persistant dizziness or light-headedness   Complete by: As directed    Call MD for:  persistant nausea and vomiting   Complete by: As directed    Call MD for:  redness, tenderness, or signs of infection (pain, swelling, redness, odor or green/yellow discharge around incision site)   Complete by: As directed    Call MD for:  severe uncontrolled pain   Complete by: As directed    Call MD for:  temperature >100.4   Complete by: As directed    Diet - low sodium heart healthy   Complete by: As directed    Discharge instructions   Complete by: As directed    You were seen after being found down.    You had soft tissue injuries of your shoulder and your right side of your face, but no bony injuries were seen on imaging.  You had rhabdomyolysis which has improved.  Continue to stay hydrated.  You should have repeat labs on Monday.  Don't take your lipitor until your CK normalizes.  Follow up Megan Presti repeat CMP on Monday to follow up your kidney, liver function, and electrolytes.  Your painful and stiff shoulder should be followed with [facility].  You sound like you might have parkinson's.  Follow this outpatient with [name].  We've placed Johnny [name] referral.  You had pulmonary nodules seen on imaging, you should follow these with Dr. Camillo [name] outpatient.   Return for new, recurrent, or worsening  symptoms.  Please ask your PCP to request records from this hospitalization so they know what was done and what the next steps will be.   Discharge wound care:   Complete by: As directed    Continue offloading, foam dressing, frequent turns   Increase activity slowly   Complete by: As directed       Allergies as of 05/07/2022   No Known Allergies      Medication List     STOP taking these medications    atorvastatin 20 MG tablet Commonly known as: LIPITOR       TAKE these medications    aspirin EC 81 MG tablet Take 81 mg by mouth daily. Swallow whole.   cetirizine 10 MG tablet Commonly known as: ZYRTEC Take 10 mg by mouth daily.   dextromethorphan-guaiFENesin 30-600 MG 12hr tablet Commonly known as: MUCINEX DM Take 1 tablet by mouth 2 (two) times daily.   FOLIC ACID PO Take 1 tablet by mouth daily.   multivitamin with minerals Tabs tablet Take 1 tablet by mouth daily.   omeprazole 40 MG capsule Commonly known as: PRILOSEC Take 40 mg by mouth at bedtime.   VITAMIN C PO Take 1 tablet by mouth daily.   VITAMIN D3 PO Take 1 tablet by mouth daily.   ZINC PO Take 1 tablet by mouth daily.               Discharge Care Instructions  (From admission, onward)           Start     Ordered   05/07/22 0000  Discharge wound care:       Comments: Continue offloading, foam dressing, frequent turns   05/07/22 1136           No Known Allergies  Follow-up Information     [facility].   Specialty: Specialist Contact information: [address] [place] [address] [phone]         [facility].   Contact information: [place], [place] [phone]        [name], MD Follow up.   Specialty: Family Medicine Contact information: [address] [place] [address] [phone]                   The results of significant diagnostics from this hospitalization (including imaging, microbiology, ancillary and laboratory) are listed below for reference.    Significant Diagnostic Studies: CT Head Wo Contrast  Result Date: 05/03/2022 CLINICAL DATA:  Found down, right-sided facial swelling EXAM: CT HEAD WITHOUT CONTRAST TECHNIQUE: Contiguous axial images were obtained from the base of the skull through the vertex without intravenous contrast. RADIATION DOSE REDUCTION: This exam was performed according to the departmental dose-optimization program which includes automated exposure control, adjustment of the mA and/or  kV according to patient size and/or use of iterative reconstruction technique. COMPARISON:  09/26/2020, 02/20/2014 FINDINGS: Brain: No acute infarct or hemorrhage. Lateral ventricles and midline structures are unremarkable. No acute extra-axial fluid collections. No mass effect. Vascular: No hyperdense vessel or unexpected calcification. Skull: Asymmetric right-sided scalp edema could be dependent edema given clinical history. No underlying fractures. Remainder of the calvarium is unremarkable. Sinuses/Orbits: Paranasal sinuses are unremarkable. Chronic right mastoid effusion. Other: None. IMPRESSION: 1. No acute intracranial process. 2. Right-sided scalp swelling likely due to dependent edema given history of patient being found down. Electronically Signed   By: Randa Ngo M.D.   On: 05/03/2022 18:26   CT Cervical Spine Wo Contrast  Result Date: 05/03/2022 CLINICAL DATA:  Trauma, right facial swelling EXAM: CT MAXILLOFACIAL WITHOUT CONTRAST CT CERVICAL SPINE WITHOUT CONTRAST TECHNIQUE: Multidetector CT imaging of the maxillofacial structures was performed. Multiplanar CT image reconstructions were also generated. Alexxander Kurt small metallic BB was placed on the right temple in order to reliably differentiate right from left. Multidetector CT imaging of the cervical spine was performed  without intravenous contrast. Multiplanar CT image reconstructions were also generated. RADIATION DOSE REDUCTION: This exam was performed according to the departmental dose-optimization program which includes automated exposure control, adjustment of the mA and/or kV according to patient size and/or use of iterative reconstruction technique. COMPARISON:  Concurrent CT head. FINDINGS: CT MAXILLOFACIAL FINDINGS Osseous: No evidence of maxillofacial fracture. Nasal bones are intact. Mandibles intact. Bilateral mandibular condyles are well-seated in the TMJs. Orbits: Bilateral orbits, including the globes and retroconal soft tissues, are within normal limits. Sinuses: The visualized paranasal sinuses are essentially clear. The mastoid air cells are unopacified. Soft tissues: Mild soft tissue swelling overlying the right frontal bone, right orbit, and right lateral zygoma/maxilla. Limited intracranial: Evaluated on dedicated CT head. CT CERVICAL FINDINGS Alignment: Normal cervical lordosis. Skull base and vertebrae: No acute fracture. No primary bone lesion or focal pathologic process. Soft tissues and spinal canal: No prevertebral fluid or swelling. No visible canal hematoma. Disc levels: Mild degenerative changes of the mid/lower cervical spine. Spinal canal is patent. Upper chest: Evaluated on dedicated CT chest. Other: None. IMPRESSION: Mild right facial soft tissue swelling. No evidence of maxillofacial fracture. No traumatic injury to the cervical spine. Mild degenerative changes. Electronically Signed   By: Julian Hy M.D.   On: 05/03/2022 18:28   CT CHEST ABDOMEN PELVIS W CONTRAST  Result Date: 05/03/2022 CLINICAL DATA:  Polytrauma, blunt fall EXAM: CT CHEST, ABDOMEN, AND PELVIS WITH CONTRAST TECHNIQUE: Multidetector CT imaging of the chest, abdomen and pelvis was performed following the standard protocol during bolus administration of intravenous contrast. RADIATION DOSE REDUCTION: This exam was  performed according to the departmental dose-optimization program which includes automated exposure control, adjustment of the mA and/or kV according to patient size and/or use of iterative reconstruction technique. CONTRAST:  13m OMNIPAQUE IOHEXOL 300 MG/ML  SOLN COMPARISON:  CT abdomen pelvis 04/15/2022.  CT chest 11/04/2020 FINDINGS: CT CHEST FINDINGS Cardiovascular: Heart size is normal. No pericardial effusion. Thoracic aorta is normal in course and caliber. Scattered atherosclerotic calcifications of the aorta. Central pulmonary vasculature is within normal limits. Mediastinum/Nodes: No enlarged mediastinal, hilar, or axillary lymph nodes. Thyroid gland, trachea, and esophagus demonstrate no significant findings. Lungs/Pleura: Small dependent opacity in the right lower lobe, atelectasis or pneumonia. 10 mm part solid subpleural nodule in the posterior right lower lobe (series 5, image 108), present on the previous exam, although solid component appears slightly increased. Additional scattered areas  of subtle nodularity within the bilateral lung bases are similar in appearance to prior. No pleural effusion or pneumothorax. Musculoskeletal: Partially visualized soft tissue swelling and ill-defined fluid at the posterior aspect of the right shoulder with asymmetric enlargement of the posterior right deltoid muscle (series 3, images 1-7). Intramuscular collection within the deltoid muscle is not excluded. No acute osseous abnormality. Mildly exaggerated thoracic kyphosis. CT ABDOMEN PELVIS FINDINGS Hepatobiliary: No hepatic injury or perihepatic hematoma. Gallbladder is unremarkable. Pancreas: Unremarkable. No pancreatic ductal dilatation or surrounding inflammatory changes. Spleen: No splenic injury or perisplenic hematoma. Adrenals/Urinary Tract: No adrenal hemorrhage or renal injury identified. No renal stone or hydronephrosis. Bladder is unremarkable. Stomach/Bowel: Stomach is within normal limits. No  evidence of bowel wall thickening, distention, or inflammatory changes. Moderate volume stool distending the rectum. Vascular/Lymphatic: Aortic atherosclerosis. No enlarged abdominal or pelvic lymph nodes. Reproductive: Prior prostatectomy. Other: No free fluid. No abdominopelvic fluid collection. No pneumoperitoneum. No abdominal wall hernia. Musculoskeletal: Soft tissue swelling with ill-defined fluid overlying posterior right gluteal region (series 3, image 130). No well-defined fluid collection or hematoma. No acute osseous abnormality is identified. Pelvic bony ring intact. Degenerative changes of both hips. Advanced multilevel lumbar spondylosis, similar in appearance to prior. IMPRESSION: 1. Partially visualized soft tissue swelling and ill-defined fluid at the posterior aspect of the right shoulder with asymmetric enlargement of the posterior right deltoid muscle, likely posttraumatic. Intramuscular hematoma within the deltoid muscle is not excluded. 2. Soft tissue swelling with ill-defined fluid overlying the right gluteal region without well-defined fluid collection or hematoma. 3. Small dependent opacity in the right lower lobe, atelectasis or pneumonia. 4. Multiple pulmonary nodules including Johnny [name] 10 mm part solid subpleural nodule in the posterior right lower lobe, present on the previous exam, although solid component appears slightly increased. Indolent neoplasm such as adenocarcinoma is not excluded. Consider one of the following in 3 months for both low-risk and high-risk individuals: (Johnny [name]) repeat chest CT, (b) follow-up PET-CT, or (c) tissue sampling. This recommendation follows the consensus statement: Guidelines for Management of Incidental Pulmonary Nodules Detected on CT Images: From the Fleischner Society 2017; Radiology 2017; 284:228-243. 5. No acute/traumatic abdominopelvic pathology. 6. Moderate volume stool distending the rectum. Correlate for constipation. 7. Aortic atherosclerosis  (ICD10-I70.0). Electronically Signed   By: Davina Poke D.O.   On: 05/03/2022 18:38   DG Shoulder Right Port  Result Date: 05/04/2022 CLINICAL DATA:  Right shoulder pain and bruising. EXAM: RIGHT SHOULDER - 1 VIEW COMPARISON:  CT chest 05/03/2022. FINDINGS: The mineralization and alignment are normal. There is no evidence of acute fracture or dislocation. There are moderate glenohumeral degenerative changes. There is surrounding soft tissues appear unremarkable. IMPRESSION: Moderate glenohumeral degenerative changes. No acute osseous findings. Electronically Signed   By: Richardean Sale M.D.   On: 05/04/2022 14:16   [place] RT UPPER EXTREM LTD SOFT TISSUE NON VASCULAR  Result Date: 05/07/2022 CLINICAL DATA:  Hematuria.  Patient fall. EXAM: ULTRASOUND RIGHT UPPER EXTREMITY LIMITED TECHNIQUE: Ultrasound examination of the upper extremity soft tissues was performed in the area of clinical concern. COMPARISON:  None Available. FINDINGS: In the area of bruising along the right shoulder, no substantial masslike hematoma or collection is identified. No cystic lesion noted. IMPRESSION: 1. No confluent hematoma, mass, or cystic collection is identified by sonography in the region of bruising along the shoulder. Electronically Signed   By: Van Clines M.D.   On: 05/07/2022 08:28   CT Maxillofacial Wo Contrast  Result Date: 05/03/2022 CLINICAL DATA:  Trauma,  right facial swelling EXAM: CT MAXILLOFACIAL WITHOUT CONTRAST CT CERVICAL SPINE WITHOUT CONTRAST TECHNIQUE: Multidetector CT imaging of the maxillofacial structures was performed. Multiplanar CT image reconstructions were also generated. Johnny [name] small metallic BB was placed on the right temple in order to reliably differentiate right from left. Multidetector CT imaging of the cervical spine was performed without intravenous contrast. Multiplanar CT image reconstructions were also generated. RADIATION DOSE REDUCTION: This exam was performed according to the  departmental dose-optimization program which includes automated exposure control, adjustment of the mA and/or kV according to patient size and/or use of iterative reconstruction technique. COMPARISON:  Concurrent CT head. FINDINGS: CT MAXILLOFACIAL FINDINGS Osseous: No evidence of maxillofacial fracture. Nasal bones are intact. Mandibles intact. Bilateral mandibular condyles are well-seated in the TMJs. Orbits: Bilateral orbits, including the globes and retroconal soft tissues, are within normal limits. Sinuses: The visualized paranasal sinuses are essentially clear. The mastoid air cells are unopacified. Soft tissues: Mild soft tissue swelling overlying the right frontal bone, right orbit, and right lateral zygoma/maxilla. Limited intracranial: Evaluated on dedicated CT head. CT CERVICAL FINDINGS Alignment: Normal cervical lordosis. Skull base and vertebrae: No acute fracture. No primary bone lesion or focal pathologic process. Soft tissues and spinal canal: No prevertebral fluid or swelling. No visible canal hematoma. Disc levels: Mild degenerative changes of the mid/lower cervical spine. Spinal canal is patent. Upper chest: Evaluated on dedicated CT chest. Other: None. IMPRESSION: Mild right facial soft tissue swelling. No evidence of maxillofacial fracture. No traumatic injury to the cervical spine. Mild degenerative changes. Electronically Signed   By: Julian Hy M.D.   On: 05/03/2022 18:28    Microbiology: Recent Results (from the past 240 hour(s))  Blood culture (routine x 2)     Status: None (Preliminary result)   Collection Time: 05/03/22  7:08 PM   Specimen: BLOOD RIGHT HAND  Result Value Ref Range Status   Specimen Description BLOOD RIGHT HAND  Final   Special Requests   Final    BOTTLES DRAWN AEROBIC AND ANAEROBIC Blood Culture adequate volume   Culture   Final    NO GROWTH 4 DAYS Performed at [facility], [place] [address] [place], [place] [address]    Report Status PENDING   Incomplete  Blood culture (routine x 2)     Status: None (Preliminary result)   Collection Time: 05/03/22  7:08 PM   Specimen: BLOOD  Result Value Ref Range Status   Specimen Description BLOOD RIGHT ANTECUBITAL  Final   Special Requests   Final    BOTTLES DRAWN AEROBIC AND ANAEROBIC Blood Culture results may not be optimal due to an excessive volume of blood received in culture bottles   Culture   Final    NO GROWTH 4 DAYS Performed at [facility], [place] [address] [place], [place] [address]    Report Status PENDING  Incomplete     Labs: Basic Metabolic Panel: Recent Labs  Lab 05/03/22 1616 05/04/22 0010 05/06/22 0618 05/06/22 1841 05/07/22 0623  NA 142 142 140  --  141  K 3.5 3.2* 3.2*  --  3.0*  CL 108 106 111  --  110  CO2 '28 25 24  '$ --  25  GLUCOSE 111* 94 110*  --  113*  BUN 37* 31* 25*  --  17  CREATININE 1.00 0.90 1.41* 1.30* 1.26*  CALCIUM 9.3 8.9 8.2*  --  8.2*  MG  --   --  1.9  --  2.0  PHOS  --   --  3.3  --  3.5   Liver Function Tests: Recent Labs  Lab 05/03/22 1616 05/04/22 0010 05/06/22 0618 05/07/22 0623  AST 203* 165* 75* 79*  ALT 44 40 33 32  ALKPHOS 62 53 52 54  BILITOT 1.3* 1.0 0.6 0.9  PROT 6.2* 5.4* 4.4* 4.7*  ALBUMIN 3.4* 2.9* 2.3* 2.3*   No results for input(s): LIPASE, AMYLASE in the last 168 hours. Recent Labs  Lab 05/03/22 1616  AMMONIA 13   CBC: Recent Labs  Lab 05/03/22 1616 05/04/22 0010 05/06/22 0618 05/07/22 0623  WBC 11.1* 9.4 6.0 6.8  NEUTROABS 9.4* 7.6 4.6 5.2  HGB 15.0 13.1 10.8* 11.3*  HCT 43.6 37.8* 31.2* 31.6*  MCV 93.2 93.8 94.0 93.5  PLT 157 153 124* 127*   Cardiac Enzymes: Recent Labs  Lab 05/03/22 1616 05/04/22 0010 05/06/22 0618 05/07/22 0623 05/07/22 0925  CKTOTAL 5,015* 3,822* 911* 1,187* 1,106*   BNP: BNP (last 3 results) No results for input(s): BNP in the last 8760 hours.  ProBNP (last 3 results) No results for input(s): PROBNP in the last 8760 hours.  CBG: No results for  input(s): GLUCAP in the last 168 hours.     Signed:  Fayrene Helper MD.  [facility] 05/07/2022, 11:55 AM

## 2022-05-07 NOTE — Plan of Care (Signed)
  Problem: Clinical Measurements: Goal: Diagnostic test results will improve Outcome: Not Progressing    Problem: Activity: Goal: Risk for activity intolerance will decrease Outcome: Not Progressing   Problem: Clinical Measurements: Goal: Cardiovascular complication will be avoided Outcome: Not Progressing   Problem: Safety: Goal: Ability to remain free from injury will improve Outcome: Not Progressing   Problem: Skin Integrity: Goal: Risk for impaired skin integrity will decrease Outcome: Not Progressing

## 2022-05-08 LAB — CULTURE, BLOOD (ROUTINE X 2)
Culture: NO GROWTH
Culture: NO GROWTH
Special Requests: ADEQUATE

## 2022-05-10 ENCOUNTER — Encounter: Payer: Self-pay | Admitting: Adult Health

## 2022-05-10 ENCOUNTER — Non-Acute Institutional Stay (SKILLED_NURSING_FACILITY): Payer: Medicare Other | Admitting: Adult Health

## 2022-05-10 DIAGNOSIS — N179 Acute kidney failure, unspecified: Secondary | ICD-10-CM | POA: Diagnosis not present

## 2022-05-10 DIAGNOSIS — E43 Unspecified severe protein-calorie malnutrition: Secondary | ICD-10-CM

## 2022-05-10 DIAGNOSIS — R918 Other nonspecific abnormal finding of lung field: Secondary | ICD-10-CM

## 2022-05-10 DIAGNOSIS — S060X9S Concussion with loss of consciousness of unspecified duration, sequela: Secondary | ICD-10-CM | POA: Diagnosis not present

## 2022-05-10 DIAGNOSIS — K5901 Slow transit constipation: Secondary | ICD-10-CM

## 2022-05-10 DIAGNOSIS — I700 Atherosclerosis of aorta: Secondary | ICD-10-CM | POA: Diagnosis not present

## 2022-05-10 DIAGNOSIS — M25551 Pain in right hip: Secondary | ICD-10-CM | POA: Diagnosis not present

## 2022-05-10 DIAGNOSIS — T796XXS Traumatic ischemia of muscle, sequela: Secondary | ICD-10-CM | POA: Diagnosis not present

## 2022-05-10 DIAGNOSIS — M25552 Pain in left hip: Secondary | ICD-10-CM | POA: Diagnosis not present

## 2022-05-10 DIAGNOSIS — E876 Hypokalemia: Secondary | ICD-10-CM | POA: Diagnosis not present

## 2022-05-10 LAB — COMPREHENSIVE METABOLIC PANEL
Albumin: 2.4 — AB (ref 3.5–5.0)
Calcium: 7.8 — AB (ref 8.7–10.7)
Globulin: 1.7

## 2022-05-10 LAB — CBC AND DIFFERENTIAL
HCT: 30 — AB (ref 41–53)
Hemoglobin: 10 — AB (ref 13.5–17.5)
Neutrophils Absolute: 3300
Platelets: 132 10*3/uL — AB (ref 150–400)
WBC: 5

## 2022-05-10 LAB — BASIC METABOLIC PANEL
BUN: 19 (ref 4–21)
CO2: 29 — AB (ref 13–22)
Chloride: 106 (ref 99–108)
Creatinine: 0.9 (ref 0.6–1.3)
Glucose: 103
Potassium: 3.3 mEq/L — AB (ref 3.5–5.1)
Sodium: 139 (ref 137–147)

## 2022-05-10 LAB — CBC: RBC: 3.07 — AB (ref 3.87–5.11)

## 2022-05-10 LAB — HEPATIC FUNCTION PANEL
ALT: 19 U/L (ref 10–40)
AST: 28 (ref 14–40)
Alkaline Phosphatase: 56 (ref 25–125)
Bilirubin, Total: 0.4

## 2022-05-10 NOTE — Progress Notes (Signed)
Location:  [facility] Room Number: 41-A Place of Service:  SNF (31) Provider:  [name], DNP, FNP-BC  Patient Care Team: [name], MD as PCP - General (Family Medicine) Carolan Clines, MD (Inactive) as Consulting Physician (Urology) Michael Boston, MD as Consulting Physician (General Surgery) [name], MD (Inactive) as Consulting Physician (Gastroenterology) Ruby Cola, MD as Consulting Physician (Otolaryngology)  Extended Emergency Contact Information Primary Emergency Contact: Brandt,kenneth Mobile Phone: [phone] Relation: Son Secondary Emergency Contact: White,Jody Mobile Phone: [phone] Relation: Friend  Code Status:  Full Code  Goals of care: Advanced Directive information    05/10/2022   11:47 AM  Advanced Directives  Does Patient Have a Medical Advance Directive? No  Would patient like information on creating a medical advance directive? No - Patient declined     Chief Complaint  Patient presents with   Acute Visit    Bilateral hip pain    HPI:  Pt is a 81 y.o. male who was admitted to [facility] on 05/07/22 post hospital admission 05/03/22 to 05/07/22. He has a PMH of prior prostate cancer, history of prostatectomy, reflux and hyperlipidemia.  He presented to the ER after being found on the floor in his apartment at [facility]. The facility staff went to check on him and found him on the floor confused and EMS brought him to ED. CT scan of the head, cervical spine, chest, abdomen and pelvis were all negative for fractures.  He was treated for acute rhabdomyolysis secondary to prolonged immobility, fall, acute metabolic encephalopathy probably from concussion and acute hospitalization.  Hospitalization was complicated by mild AKI.  He improved with conservative care, IVF.  CK improved overall upon discharge.  He complained of bilateral hip pain and constipation today.  X-ray of bilateral  hips ordered stat showed no acute bony abnormality. Labs showed K 3.3, no, albumin 3.1, hemoglobin 10.0   Past Medical History:  Diagnosis Date   Dysphagia, pharyngoesophageal phase 02/14/2014   GERD (gastroesophageal reflux disease)    History of prostate cancer    S/P PROSTATECTOMY 2009   Hydrocele    SCROTAL   Hyperlipidemia    Organic impotence    Wears glasses    Past Surgical History:  Procedure Laterality Date   BRAVO [place] STUDY N/A 05/06/2014   Procedure: BRAVO [place];  Surgeon: [name], MD;  Location: [facility];  Service: Endoscopy;  Laterality: N/A;   ESOPHAGOGASTRODUODENOSCOPY (EGD) WITH PROPOFOL N/A 05/06/2014   Procedure: ESOPHAGOGASTRODUODENOSCOPY (EGD) WITH PROPOFOL;  Surgeon: [name], MD;  Location: [facility];  Service: Endoscopy;  Laterality: N/A;   HYDROCELE EXCISION Left 1994   HYDROCELE EXCISION Right 10/15/2013   Procedure: HYDROCELECTOMY ADULT;  Surgeon: Ailene Rud, MD;  Location: [facility];  Service: Urology;  Laterality: Right;  with excision of multiple epidyidmal cyst   HYDROCELE EXCISION Left 10/14/2014   Procedure: EPIDIDYMAL CYST EXCISION, LEFT;  Surgeon: Ailene Rud, MD;  Location: [facility];  Service: Urology;  Laterality: Left;   INGUINAL HERNIA REPAIR Bilateral 10/15/2013   Procedure:  LAPAROSCOPIC EXPLORATION  with REPAIR OF BILATERAL INGUINAL HERNIA;  Surgeon: Adin Hector, MD;  Location: [place];  Service: General;  Laterality: Bilateral;   INSERTION OF MESH Bilateral 10/15/2013   Procedure: INSERTION OF MESH;  Surgeon: Adin Hector, MD;  Location: [place];  Service: General;  Laterality: Bilateral;   PILONIDAL CYST EXCISION  1970'S  ROBOT ASSISTED LAPAROSCOPIC RADICAL PROSTATECTOMY  01-01-2008  DR Alinda Money   TENOTOMY ACHILLES TENDON Right 11-11-2005   TONSILLECTOMY     child    No Known Allergies  Outpatient Encounter Medications as of  05/10/2022  Medication Sig   acetaminophen (TYLENOL) 325 MG tablet Take 650 mg by mouth every 4 (four) hours as needed. for fever greater than 100.6 x 48 hours. Notify MD of initial fever and temperature greater than 101. Do not exceed 3,000 mg in 24 hours   Ascorbic Acid (VITAMIN C PO) Take 1 tablet by mouth daily.   aspirin EC 81 MG tablet Take 81 mg by mouth daily. Swallow whole.   cetirizine (ZYRTEC) 10 MG tablet Take 10 mg by mouth daily.   Cholecalciferol (VITAMIN D3 PO) Take 1 tablet by mouth daily.   dextromethorphan-guaiFENesin (MUCINEX DM) 30-600 MG per 12 hr tablet Take 1 tablet by mouth 2 (two) times daily.   FOLIC ACID PO Take 1 mg by mouth daily.   Multiple Vitamin (MULTIVITAMIN WITH MINERALS) TABS tablet Take 1 tablet by mouth daily.   Multiple Vitamins-Minerals (ZINC PO) Take 1 tablet by mouth daily.   omeprazole (PRILOSEC) 40 MG capsule Take 40 mg by mouth at bedtime.   Facility-Administered Encounter Medications as of 05/10/2022  Medication   bupivacaine (MARCAINE) 0.5 % (with pres) injection 50 mL    Review of Systems  Constitutional:  Negative for activity change, appetite change and fever.  HENT:  Negative for sore throat.   Eyes: Negative.   Cardiovascular:  Negative for chest pain and leg swelling.  Gastrointestinal:  Positive for constipation. Negative for abdominal distention, diarrhea and vomiting.  Genitourinary:  Negative for dysuria, frequency and urgency.  Skin:  Negative for color change.  Neurological:  Negative for dizziness and headaches.  Psychiatric/Behavioral:  Negative for behavioral problems and sleep disturbance. The patient is not nervous/anxious.       Immunization History  Administered Date(s) Administered   Influenza Split 09/10/2010, 09/01/2011, 09/06/2012, 08/25/2015   Influenza-Unspecified 08/09/2014, 08/25/2015, 09/05/2016   PFIZER Comirnaty(Gray Top)Covid-19 Tri-Sucrose Vaccine 12/25/2019, 01/15/2020, 03/25/2021   PFIZER(Purple  Top)SARS-COV-2 Vaccination 12/25/2019, 01/15/2020   PNEUMOCOCCAL CONJUGATE-20 03/25/2021   Pneumococcal Conjugate-13 09/12/2012   Pneumococcal Polysaccharide-23 09/07/2004   Pertinent  Health Maintenance Due  Topic Date Due   INFLUENZA VACCINE  07/06/2022      05/05/2022   10:20 AM 05/05/2022    9:15 PM 05/06/2022   10:18 AM 05/06/2022    9:00 PM 05/07/2022    8:45 AM  Fall Risk  Patient Fall Risk Level High fall risk High fall risk High fall risk High fall risk High fall risk     Vitals:   05/10/22 1140  BP: 130/69  Pulse: 78  Resp: 18  Temp: (!) 97.2 F (36.2 C)  SpO2: 96%  Height: '5\' 10"'$  (1.778 m)   Body mass index is 23.09 kg/m.  Physical Exam Constitutional:      General: He is not in acute distress.    Appearance: Normal appearance.  HENT:     Head: Normocephalic and atraumatic.     Mouth/Throat:     Mouth: Mucous membranes are moist.  Eyes:     Conjunctiva/sclera: Conjunctivae normal.  Cardiovascular:     Rate and Rhythm: Normal rate and regular rhythm.     Pulses: Normal pulses.     Heart sounds: Normal heart sounds.  Pulmonary:     Effort: Pulmonary effort is normal.     Breath sounds: Normal  breath sounds.  Abdominal:     General: Bowel sounds are normal.     Palpations: Abdomen is soft.  Musculoskeletal:        General: No swelling.     Cervical back: Normal range of motion.  Skin:    General: Skin is warm and dry.  Neurological:     General: No focal deficit present.     Mental Status: He is alert and oriented to person, place, and time.  Psychiatric:        Mood and Affect: Mood normal.        Behavior: Behavior normal.        Thought Content: Thought content normal.        Judgment: Judgment normal.      Labs reviewed: Recent Labs    05/04/22 0010 05/06/22 0618 05/06/22 1841 05/07/22 0623  NA 142 140  --  141  K 3.2* 3.2*  --  3.0*  CL 106 111  --  110  CO2 25 24  --  25  GLUCOSE 94 110*  --  113*  BUN 31* 25*  --  17   CREATININE 0.90 1.41* 1.30* 1.26*  CALCIUM 8.9 8.2*  --  8.2*  MG  --  1.9  --  2.0  PHOS  --  3.3  --  3.5   Recent Labs    05/04/22 0010 05/06/22 0618 05/07/22 0623  AST 165* 75* 79*  ALT 40 33 32  ALKPHOS 53 52 54  BILITOT 1.0 0.6 0.9  PROT 5.4* 4.4* 4.7*  ALBUMIN 2.9* 2.3* 2.3*   Recent Labs    05/04/22 0010 05/06/22 0618 05/07/22 0623  WBC 9.4 6.0 6.8  NEUTROABS 7.6 4.6 5.2  HGB 13.1 10.8* 11.3*  HCT 37.8* 31.2* 31.6*  MCV 93.8 94.0 93.5  PLT 153 124* 127*   Lab Results  Component Value Date   TSH 2.758 05/03/2022   No results found for: HGBA1C No results found for: CHOL, HDL, LDLCALC, LDLDIRECT, TRIG, CHOLHDL  Significant Diagnostic Results in last 30 days:  CT Head Wo Contrast  Result Date: 05/03/2022 CLINICAL DATA:  Found down, right-sided facial swelling EXAM: CT HEAD WITHOUT CONTRAST TECHNIQUE: Contiguous axial images were obtained from the base of the skull through the vertex without intravenous contrast. RADIATION DOSE REDUCTION: This exam was performed according to the departmental dose-optimization program which includes automated exposure control, adjustment of the mA and/or kV according to patient size and/or use of iterative reconstruction technique. COMPARISON:  09/26/2020, 02/20/2014 FINDINGS: Brain: No acute infarct or hemorrhage. Lateral ventricles and midline structures are unremarkable. No acute extra-axial fluid collections. No mass effect. Vascular: No hyperdense vessel or unexpected calcification. Skull: Asymmetric right-sided scalp edema could be dependent edema given clinical history. No underlying fractures. Remainder of the calvarium is unremarkable. Sinuses/Orbits: Paranasal sinuses are unremarkable. Chronic right mastoid effusion. Other: None. IMPRESSION: 1. No acute intracranial process. 2. Right-sided scalp swelling likely due to dependent edema given history of patient being found down. Electronically Signed   By: Randa Ngo M.D.   On:  05/03/2022 18:26   CT Cervical Spine Wo Contrast  Result Date: 05/03/2022 CLINICAL DATA:  Trauma, right facial swelling EXAM: CT MAXILLOFACIAL WITHOUT CONTRAST CT CERVICAL SPINE WITHOUT CONTRAST TECHNIQUE: Multidetector CT imaging of the maxillofacial structures was performed. Multiplanar CT image reconstructions were also generated. A small metallic BB was placed on the right temple in order to reliably differentiate right from left. Multidetector CT imaging of the cervical spine  was performed without intravenous contrast. Multiplanar CT image reconstructions were also generated. RADIATION DOSE REDUCTION: This exam was performed according to the departmental dose-optimization program which includes automated exposure control, adjustment of the mA and/or kV according to patient size and/or use of iterative reconstruction technique. COMPARISON:  Concurrent CT head. FINDINGS: CT MAXILLOFACIAL FINDINGS Osseous: No evidence of maxillofacial fracture. Nasal bones are intact. Mandibles intact. Bilateral mandibular condyles are well-seated in the TMJs. Orbits: Bilateral orbits, including the globes and retroconal soft tissues, are within normal limits. Sinuses: The visualized paranasal sinuses are essentially clear. The mastoid air cells are unopacified. Soft tissues: Mild soft tissue swelling overlying the right frontal bone, right orbit, and right lateral zygoma/maxilla. Limited intracranial: Evaluated on dedicated CT head. CT CERVICAL FINDINGS Alignment: Normal cervical lordosis. Skull base and vertebrae: No acute fracture. No primary bone lesion or focal pathologic process. Soft tissues and spinal canal: No prevertebral fluid or swelling. No visible canal hematoma. Disc levels: Mild degenerative changes of the mid/lower cervical spine. Spinal canal is patent. Upper chest: Evaluated on dedicated CT chest. Other: None. IMPRESSION: Mild right facial soft tissue swelling. No evidence of maxillofacial fracture. No  traumatic injury to the cervical spine. Mild degenerative changes. Electronically Signed   By: Julian Hy M.D.   On: 05/03/2022 18:28   CT CHEST ABDOMEN PELVIS W CONTRAST  Result Date: 05/03/2022 CLINICAL DATA:  Polytrauma, blunt fall EXAM: CT CHEST, ABDOMEN, AND PELVIS WITH CONTRAST TECHNIQUE: Multidetector CT imaging of the chest, abdomen and pelvis was performed following the standard protocol during bolus administration of intravenous contrast. RADIATION DOSE REDUCTION: This exam was performed according to the departmental dose-optimization program which includes automated exposure control, adjustment of the mA and/or kV according to patient size and/or use of iterative reconstruction technique. CONTRAST:  123m OMNIPAQUE IOHEXOL 300 MG/ML  SOLN COMPARISON:  CT abdomen pelvis 04/15/2022.  CT chest 11/04/2020 FINDINGS: CT CHEST FINDINGS Cardiovascular: Heart size is normal. No pericardial effusion. Thoracic aorta is normal in course and caliber. Scattered atherosclerotic calcifications of the aorta. Central pulmonary vasculature is within normal limits. Mediastinum/Nodes: No enlarged mediastinal, hilar, or axillary lymph nodes. Thyroid gland, trachea, and esophagus demonstrate no significant findings. Lungs/Pleura: Small dependent opacity in the right lower lobe, atelectasis or pneumonia. 10 mm part solid subpleural nodule in the posterior right lower lobe (series 5, image 108), present on the previous exam, although solid component appears slightly increased. Additional scattered areas of subtle nodularity within the bilateral lung bases are similar in appearance to prior. No pleural effusion or pneumothorax. Musculoskeletal: Partially visualized soft tissue swelling and ill-defined fluid at the posterior aspect of the right shoulder with asymmetric enlargement of the posterior right deltoid muscle (series 3, images 1-7). Intramuscular collection within the deltoid muscle is not excluded. No acute  osseous abnormality. Mildly exaggerated thoracic kyphosis. CT ABDOMEN PELVIS FINDINGS Hepatobiliary: No hepatic injury or perihepatic hematoma. Gallbladder is unremarkable. Pancreas: Unremarkable. No pancreatic ductal dilatation or surrounding inflammatory changes. Spleen: No splenic injury or perisplenic hematoma. Adrenals/Urinary Tract: No adrenal hemorrhage or renal injury identified. No renal stone or hydronephrosis. Bladder is unremarkable. Stomach/Bowel: Stomach is within normal limits. No evidence of bowel wall thickening, distention, or inflammatory changes. Moderate volume stool distending the rectum. Vascular/Lymphatic: Aortic atherosclerosis. No enlarged abdominal or pelvic lymph nodes. Reproductive: Prior prostatectomy. Other: No free fluid. No abdominopelvic fluid collection. No pneumoperitoneum. No abdominal wall hernia. Musculoskeletal: Soft tissue swelling with ill-defined fluid overlying posterior right gluteal region (series 3, image 130). No well-defined fluid  collection or hematoma. No acute osseous abnormality is identified. Pelvic bony ring intact. Degenerative changes of both hips. Advanced multilevel lumbar spondylosis, similar in appearance to prior. IMPRESSION: 1. Partially visualized soft tissue swelling and ill-defined fluid at the posterior aspect of the right shoulder with asymmetric enlargement of the posterior right deltoid muscle, likely posttraumatic. Intramuscular hematoma within the deltoid muscle is not excluded. 2. Soft tissue swelling with ill-defined fluid overlying the right gluteal region without well-defined fluid collection or hematoma. 3. Small dependent opacity in the right lower lobe, atelectasis or pneumonia. 4. Multiple pulmonary nodules including a 10 mm part solid subpleural nodule in the posterior right lower lobe, present on the previous exam, although solid component appears slightly increased. Indolent neoplasm such as adenocarcinoma is not excluded. Consider  one of the following in 3 months for both low-risk and high-risk individuals: (a) repeat chest CT, (b) follow-up PET-CT, or (c) tissue sampling. This recommendation follows the consensus statement: Guidelines for Management of Incidental Pulmonary Nodules Detected on CT Images: From the Fleischner Society 2017; Radiology 2017; 284:228-243. 5. No acute/traumatic abdominopelvic pathology. 6. Moderate volume stool distending the rectum. Correlate for constipation. 7. Aortic atherosclerosis (ICD10-I70.0). Electronically Signed   By: Davina Poke D.O.   On: 05/03/2022 18:38   DG Shoulder Right Port  Result Date: 05/04/2022 CLINICAL DATA:  Right shoulder pain and bruising. EXAM: RIGHT SHOULDER - 1 VIEW COMPARISON:  CT chest 05/03/2022. FINDINGS: The mineralization and alignment are normal. There is no evidence of acute fracture or dislocation. There are moderate glenohumeral degenerative changes. There is surrounding soft tissues appear unremarkable. IMPRESSION: Moderate glenohumeral degenerative changes. No acute osseous findings. Electronically Signed   By: Richardean Sale M.D.   On: 05/04/2022 14:16   [place] RT UPPER EXTREM LTD SOFT TISSUE NON VASCULAR  Result Date: 05/07/2022 CLINICAL DATA:  Hematuria.  Patient fall. EXAM: ULTRASOUND RIGHT UPPER EXTREMITY LIMITED TECHNIQUE: Ultrasound examination of the upper extremity soft tissues was performed in the area of clinical concern. COMPARISON:  None Available. FINDINGS: In the area of bruising along the right shoulder, no substantial masslike hematoma or collection is identified. No cystic lesion noted. IMPRESSION: 1. No confluent hematoma, mass, or cystic collection is identified by sonography in the region of bruising along the shoulder. Electronically Signed   By: Van Clines M.D.   On: 05/07/2022 08:28   CT Maxillofacial Wo Contrast  Result Date: 05/03/2022 CLINICAL DATA:  Trauma, right facial swelling EXAM: CT MAXILLOFACIAL WITHOUT CONTRAST CT  CERVICAL SPINE WITHOUT CONTRAST TECHNIQUE: Multidetector CT imaging of the maxillofacial structures was performed. Multiplanar CT image reconstructions were also generated. A small metallic BB was placed on the right temple in order to reliably differentiate right from left. Multidetector CT imaging of the cervical spine was performed without intravenous contrast. Multiplanar CT image reconstructions were also generated. RADIATION DOSE REDUCTION: This exam was performed according to the departmental dose-optimization program which includes automated exposure control, adjustment of the mA and/or kV according to patient size and/or use of iterative reconstruction technique. COMPARISON:  Concurrent CT head. FINDINGS: CT MAXILLOFACIAL FINDINGS Osseous: No evidence of maxillofacial fracture. Nasal bones are intact. Mandibles intact. Bilateral mandibular condyles are well-seated in the TMJs. Orbits: Bilateral orbits, including the globes and retroconal soft tissues, are within normal limits. Sinuses: The visualized paranasal sinuses are essentially clear. The mastoid air cells are unopacified. Soft tissues: Mild soft tissue swelling overlying the right frontal bone, right orbit, and right lateral zygoma/maxilla. Limited intracranial: Evaluated on dedicated CT head.  CT CERVICAL FINDINGS Alignment: Normal cervical lordosis. Skull base and vertebrae: No acute fracture. No primary bone lesion or focal pathologic process. Soft tissues and spinal canal: No prevertebral fluid or swelling. No visible canal hematoma. Disc levels: Mild degenerative changes of the mid/lower cervical spine. Spinal canal is patent. Upper chest: Evaluated on dedicated CT chest. Other: None. IMPRESSION: Mild right facial soft tissue swelling. No evidence of maxillofacial fracture. No traumatic injury to the cervical spine. Mild degenerative changes. Electronically Signed   By: Julian Hy M.D.   On: 05/03/2022 18:28    Assessment/Plan  1.  Traumatic rhabdomyolysis, sequela -   due to fall and prolonged immobility -   Was given IVF in the hospital, CK improved -    Lipitor on hold dosing today normalizes  2. AKI (acute kidney injury) [facility]) Lab Results  Component Value Date   NA 141 05/07/2022   K 3.0 (L) 05/07/2022   CO2 25 05/07/2022   GLUCOSE 113 (H) 05/07/2022   BUN 17 05/07/2022   CREATININE 1.26 (H) 05/07/2022   CALCIUM 8.2 (L) 05/07/2022   GFRNONAA 57 (L) 05/07/2022   -   Thought to be related to rhabdo  3. Concussion with loss of consciousness, sequela ([place]) -  Due to fall -  Follow-up with neurology  4. Pulmonary nodules -   Follow-up with pulmonology, Dr. Camillo Flaming  5. Aortic atherosclerosis (HCC) -Continue aspirin and lipitor on hold  6. Bilateral hip pain -  was given Norco 5-325 mg PO X 1 now -Started oxycodone IR 5 mg 1 tab every 6 hours PRN  7.  Slow transit constipation -   will start on MiraLAX 17 g p.o. twice daily x3 days then daily  8.  Hypokalemia -   K 3.3, low -   supplemented with KCL ER 20 meq 2 tabs = 40 meq PO X 1  9. Protein-calorie malnutrition, severe -  albumin  2.4 -   dietary consult    Family/ staff Communication: Discussed plan of care with resident and charge nurse.  Labs/tests ordered:   x-ray of bilateral hips.    [name], DNP, MSN, FNP-BC [facility] [phone] (Monday-Friday 8:00 a.m. - 5:00 p.m.) [phone] (after hours)

## 2022-05-11 ENCOUNTER — Other Ambulatory Visit: Payer: Self-pay | Admitting: Internal Medicine

## 2022-05-11 ENCOUNTER — Non-Acute Institutional Stay (SKILLED_NURSING_FACILITY): Payer: Medicare Other | Admitting: Internal Medicine

## 2022-05-11 ENCOUNTER — Encounter: Payer: Self-pay | Admitting: Internal Medicine

## 2022-05-11 DIAGNOSIS — K5901 Slow transit constipation: Secondary | ICD-10-CM | POA: Diagnosis not present

## 2022-05-11 DIAGNOSIS — T796XXS Traumatic ischemia of muscle, sequela: Secondary | ICD-10-CM

## 2022-05-11 DIAGNOSIS — M25511 Pain in right shoulder: Secondary | ICD-10-CM

## 2022-05-11 DIAGNOSIS — W19XXXS Unspecified fall, sequela: Secondary | ICD-10-CM

## 2022-05-11 DIAGNOSIS — E876 Hypokalemia: Secondary | ICD-10-CM | POA: Diagnosis not present

## 2022-05-11 DIAGNOSIS — E785 Hyperlipidemia, unspecified: Secondary | ICD-10-CM | POA: Diagnosis not present

## 2022-05-11 DIAGNOSIS — N179 Acute kidney failure, unspecified: Secondary | ICD-10-CM

## 2022-05-11 DIAGNOSIS — R918 Other nonspecific abnormal finding of lung field: Secondary | ICD-10-CM | POA: Diagnosis not present

## 2022-05-11 DIAGNOSIS — M25551 Pain in right hip: Secondary | ICD-10-CM | POA: Diagnosis not present

## 2022-05-11 DIAGNOSIS — M25552 Pain in left hip: Secondary | ICD-10-CM

## 2022-05-11 DIAGNOSIS — Z87448 Personal history of other diseases of urinary system: Secondary | ICD-10-CM

## 2022-05-11 HISTORY — DX: Pain in right shoulder: M25.511

## 2022-05-11 MED ORDER — OXYCODONE HCL 5 MG PO TABS: 5.0000 mg | ORAL_TABLET | Freq: Four times a day (QID) | ORAL | 0 refills | Status: DC | PRN

## 2022-05-11 NOTE — Progress Notes (Signed)
Provider:  Veleta Miners MD  Location:   [facility] Room Number: 28 Place of Service:  SNF (31)  PCP: [name], MD Patient Care Team: [name], MD as PCP - General (Family Medicine) Carolan Clines, MD (Inactive) as Consulting Physician (Urology) Michael Boston, MD as Consulting Physician (General Surgery) [name], MD (Inactive) as Consulting Physician (Gastroenterology) Ruby Cola, MD as Consulting Physician (Otolaryngology)  Extended Emergency Contact Information Primary Emergency Contact: Iyengar,kenneth Mobile Phone: [phone] Relation: Son Secondary Emergency Contact: White,Jody Mobile Phone: [phone] Relation: Friend  Code Status: Full Code Goals of Care: Advanced Directive information    05/11/2022   10:40 AM  Advanced Directives  Does Patient Have a Medical Advance Directive? No  Would patient like information on creating a medical advance directive? No - Patient declined      Chief Complaint  Patient presents with   New Admit To SNF    Admission to SNF    HPI: Patient is a 81 y.o. male seen today for admission to SNF for Rehab  Admitted in the hospital from 05/29-06/02 after a fall  Patient has recently moved to [facility] from his home Patient was found by security on the floor in his apartment.  He does not remember falling does not know how long he was on the floor. He was admitted with acute rhabdomyolysis secondary to prolonged immobility and acute metabolic encephalopathy His mental status improved and he was discharged to SNF No cause for his fall was found. CT scan of the head was negative for any acute changes  During his admission patient was also suspected to have Parkinson due to his tremor and rigidity. He also was found to have multiple pulmonary nodules on his CT scan Patient also had swelling in his right shoulder.  Since he has been to SNF patient has been  complaining of pain in his both hips He was seen by [facility] and the x-rays were done which showed just arthritis no acute fracture. For therapy patient was able to bear weight and walk few steps with his leg but was complaining of pain. Constipation resolved with MiraLAX today  Patient was living by himself before moving to friend's home.  Has lost a lot of weight due to the stress of the move.  Has children who live out of state.  Denies using any cane or walker.  That said that he had few falls at home with no injuries.  Denies any dizziness just the legs will give away.  Past Medical History:  Diagnosis Date   Dysphagia, pharyngoesophageal phase 02/14/2014   GERD (gastroesophageal reflux disease)    History of prostate cancer    S/P PROSTATECTOMY 2009   Hydrocele    SCROTAL   Hyperlipidemia    Organic impotence    Wears glasses    Past Surgical History:  Procedure Laterality Date   BRAVO [place] STUDY N/A 05/06/2014   Procedure: BRAVO [place] STUDY;  Surgeon: [name], MD;  Location: [facility];  Service: Endoscopy;  Laterality: N/A;   ESOPHAGOGASTRODUODENOSCOPY (EGD) WITH PROPOFOL N/A 05/06/2014   Procedure: ESOPHAGOGASTRODUODENOSCOPY (EGD) WITH PROPOFOL;  Surgeon: [name], MD;  Location: [facility];  Service: Endoscopy;  Laterality: N/A;   HYDROCELE EXCISION Left 1994   HYDROCELE EXCISION Right 10/15/2013   Procedure: HYDROCELECTOMY ADULT;  Surgeon: Ailene Rud, MD;  Location: [facility];  Service: Urology;  Laterality: Right;  with excision of multiple epidyidmal  cyst   HYDROCELE EXCISION Left 10/14/2014   Procedure: EPIDIDYMAL CYST EXCISION, LEFT;  Surgeon: Ailene Rud, MD;  Location: [facility];  Service: Urology;  Laterality: Left;   INGUINAL HERNIA REPAIR Bilateral 10/15/2013   Procedure:  LAPAROSCOPIC EXPLORATION  with REPAIR OF BILATERAL INGUINAL HERNIA;  Surgeon: Adin Hector, MD;  Location: [place];   Service: General;  Laterality: Bilateral;   INSERTION OF MESH Bilateral 10/15/2013   Procedure: INSERTION OF MESH;  Surgeon: Adin Hector, MD;  Location: [place];  Service: General;  Laterality: Bilateral;   PILONIDAL CYST EXCISION  1970'S   ROBOT ASSISTED LAPAROSCOPIC RADICAL PROSTATECTOMY  01-01-2008  DR [place] Right 11-11-2005   TONSILLECTOMY     child    reports that he quit smoking about 45 years ago. His smoking use included cigarettes. He has a 10.00 pack-year smoking history. He has never used smokeless tobacco. He reports that he does not currently use alcohol. He reports that he does not use drugs. Social History   Socioeconomic History   Marital status: Widowed    Spouse name: Not on file   Number of children: 3   Years of education: MA   Highest education level: Not on file  Occupational History   Occupation: Retired    Comment: [employer]  Tobacco Use   Smoking status: Former    Packs/day: 1.00    Years: 10.00    Pack years: 10.00    Types: Cigarettes    Quit date: 12/06/1976    Years since quitting: 45.4   Smokeless tobacco: Never  Substance and Sexual Activity   Alcohol use: Not Currently   Drug use: No   Sexual activity: Not on file  Other Topics Concern   Not on file  Social History Narrative   Not on file   Social Determinants of Health   Financial Resource Strain: Not on file  Food Insecurity: Not on file  Transportation Needs: Not on file  Physical Activity: Not on file  Stress: Not on file  Social Connections: Not on file  Intimate Partner Violence: Not on file    Functional Status Survey:    Family History  Problem Relation Age of Onset   Bladder Cancer Son    Hypertension Mother    Colon cancer Neg Hx    Liver cancer Neg Hx     Health Maintenance  Topic Date Due   TETANUS/TDAP  Never done   Zoster Vaccines- Shingrix (1 of 2) Never done   COVID-19 Vaccine (6 - Booster) 05/20/2021    INFLUENZA VACCINE  07/06/2022   Pneumonia Vaccine 76+ Years old  Completed   HPV VACCINES  Aged Out    No Known Allergies  Allergies as of 05/11/2022   No Known Allergies      Medication List        Accurate as of May 11, 2022 10:40 AM. If you have any questions, ask your nurse or doctor.          acetaminophen 325 MG tablet Commonly known as: TYLENOL Take 650 mg by mouth every 4 (four) hours as needed. for fever greater than 100.6 x 48 hours. Notify MD of initial fever and temperature greater than 101. Do not exceed 3,000 mg in 24 hours   aspirin EC 81 MG tablet Take 81 mg by mouth daily. Swallow whole.   cetirizine 10 MG tablet Commonly known as: ZYRTEC Take 10 mg  by mouth daily.   dextromethorphan-guaiFENesin 30-600 MG 12hr tablet Commonly known as: MUCINEX DM Take 1 tablet by mouth 2 (two) times daily.   FOLIC ACID PO Take 1 mg by mouth daily.   multivitamin with minerals Tabs tablet Take 1 tablet by mouth daily.   omeprazole 40 MG capsule Commonly known as: PRILOSEC Take 40 mg by mouth at bedtime.   oxyCODONE 5 MG immediate release tablet Commonly known as: Oxy IR/ROXICODONE Take 5 mg by mouth every 6 (six) hours as needed for severe pain.   polyethylene glycol 17 g packet Commonly known as: MIRALAX / GLYCOLAX Take 17 g by mouth 2 (two) times daily.   VITAMIN C PO Take 1 tablet by mouth daily.   VITAMIN D3 PO Take 1 tablet by mouth daily.   ZINC PO Take 1 tablet by mouth daily.        Review of Systems  Constitutional:  Positive for activity change and appetite change. Negative for unexpected weight change.  HENT: Negative.    Respiratory:  Negative for cough and shortness of breath.   Cardiovascular:  Positive for leg swelling.  Gastrointestinal:  Positive for constipation.  Genitourinary:  Negative for frequency.  Musculoskeletal:  Positive for arthralgias, gait problem and myalgias.  Skin: Negative.  Negative for rash.  Neurological:   Negative for dizziness and weakness.  Psychiatric/Behavioral:  Negative for confusion and sleep disturbance.   All other systems reviewed and are negative.  Vitals:   05/11/22 0910  BP: (!) 146/87  Pulse: 88  Resp: 18  Temp: 98.6 F (37 C)  SpO2: 96%  Weight: 160 lb 15 oz (73 kg)  Height: '5\' 10"'$  (1.778 m)   Body mass index is 23.09 kg/m. Physical Exam Vitals reviewed.  Constitutional:      Appearance: Normal appearance.  HENT:     Head: Normocephalic.     Nose: Nose normal.     Mouth/Throat:     Mouth: Mucous membranes are moist.     Pharynx: Oropharynx is clear.  Eyes:     Pupils: Pupils are equal, round, and reactive to light.  Cardiovascular:     Rate and Rhythm: Normal rate and regular rhythm.     Pulses: Normal pulses.     Heart sounds: No murmur heard. Pulmonary:     Effort: Pulmonary effort is normal. No respiratory distress.     Breath sounds: Normal breath sounds. No rales.  Abdominal:     General: Abdomen is flat. Bowel sounds are normal.     Palpations: Abdomen is soft.  Musculoskeletal:        General: Swelling present.     Cervical back: Neck supple.  Skin:    General: Skin is warm.  Neurological:     General: No focal deficit present.     Mental Status: He is alert and oriented to person, place, and time.     Comments: C/o Pain in his Both Thighs Left More then right  Did not c/o Pain when moving his Hips Rigidity in all extremities Resting Tremor Could not test the gait   Psychiatric:        Mood and Affect: Mood normal.        Thought Content: Thought content normal.    Labs reviewed: Basic Metabolic Panel: Recent Labs    05/04/22 0010 05/06/22 0618 05/06/22 1841 05/07/22 0623 05/10/22 0000  NA 142 140  --  141 139  K 3.2* 3.2*  --  3.0* 3.3*  CL  106 111  --  110 106  CO2 25 24  --  25 29*  GLUCOSE 94 110*  --  113*  --   BUN 31* 25*  --  17 19  CREATININE 0.90 1.41* 1.30* 1.26* 0.9  CALCIUM 8.9 8.2*  --  8.2* 7.8*  MG  --   1.9  --  2.0  --   PHOS  --  3.3  --  3.5  --    Liver Function Tests: Recent Labs    05/04/22 0010 05/06/22 0618 05/07/22 0623 05/10/22 0000  AST 165* 75* 79* 28  ALT 40 33 32 19  ALKPHOS 53 52 54 56  BILITOT 1.0 0.6 0.9  --   PROT 5.4* 4.4* 4.7*  --   ALBUMIN 2.9* 2.3* 2.3* 2.4*   No results for input(s): LIPASE, AMYLASE in the last 8760 hours. Recent Labs    05/03/22 1616  AMMONIA 13   CBC: Recent Labs    05/04/22 0010 05/06/22 0618 05/07/22 0623 05/10/22 0000  WBC 9.4 6.0 6.8 5.0  NEUTROABS 7.6 4.6 5.2 3,300.00  HGB 13.1 10.8* 11.3* 10.0*  HCT 37.8* 31.2* 31.6* 30*  MCV 93.8 94.0 93.5  --   PLT 153 124* 127* 132*   Cardiac Enzymes: Recent Labs    05/06/22 0618 05/07/22 0623 05/07/22 0925  CKTOTAL 911* 1,187* 1,106*   BNP: Invalid input(s): POCBNP No results found for: HGBA1C Lab Results  Component Value Date   TSH 2.758 05/03/2022   Lab Results  Component Value Date   [id number] 419 02/14/2014   No results found for: FOLATE No results found for: IRON, TIBC, FERRITIN  Imaging and Procedures obtained prior to SNF admission: CT Head Wo Contrast  Result Date: 05/03/2022 CLINICAL DATA:  Found down, right-sided facial swelling EXAM: CT HEAD WITHOUT CONTRAST TECHNIQUE: Contiguous axial images were obtained from the base of the skull through the vertex without intravenous contrast. RADIATION DOSE REDUCTION: This exam was performed according to the departmental dose-optimization program which includes automated exposure control, adjustment of the mA and/or kV according to patient size and/or use of iterative reconstruction technique. COMPARISON:  09/26/2020, 02/20/2014 FINDINGS: Brain: No acute infarct or hemorrhage. Lateral ventricles and midline structures are unremarkable. No acute extra-axial fluid collections. No mass effect. Vascular: No hyperdense vessel or unexpected calcification. Skull: Asymmetric right-sided scalp edema could be dependent edema  given clinical history. No underlying fractures. Remainder of the calvarium is unremarkable. Sinuses/Orbits: Paranasal sinuses are unremarkable. Chronic right mastoid effusion. Other: None. IMPRESSION: 1. No acute intracranial process. 2. Right-sided scalp swelling likely due to dependent edema given history of patient being found down. Electronically Signed   By: Randa Ngo M.D.   On: 05/03/2022 18:26   CT Cervical Spine Wo Contrast  Result Date: 05/03/2022 CLINICAL DATA:  Trauma, right facial swelling EXAM: CT MAXILLOFACIAL WITHOUT CONTRAST CT CERVICAL SPINE WITHOUT CONTRAST TECHNIQUE: Multidetector CT imaging of the maxillofacial structures was performed. Multiplanar CT image reconstructions were also generated. A small metallic BB was placed on the right temple in order to reliably differentiate right from left. Multidetector CT imaging of the cervical spine was performed without intravenous contrast. Multiplanar CT image reconstructions were also generated. RADIATION DOSE REDUCTION: This exam was performed according to the departmental dose-optimization program which includes automated exposure control, adjustment of the mA and/or kV according to patient size and/or use of iterative reconstruction technique. COMPARISON:  Concurrent CT head. FINDINGS: CT MAXILLOFACIAL FINDINGS Osseous: No evidence of maxillofacial fracture. Nasal bones  are intact. Mandibles intact. Bilateral mandibular condyles are well-seated in the TMJs. Orbits: Bilateral orbits, including the globes and retroconal soft tissues, are within normal limits. Sinuses: The visualized paranasal sinuses are essentially clear. The mastoid air cells are unopacified. Soft tissues: Mild soft tissue swelling overlying the right frontal bone, right orbit, and right lateral zygoma/maxilla. Limited intracranial: Evaluated on dedicated CT head. CT CERVICAL FINDINGS Alignment: Normal cervical lordosis. Skull base and vertebrae: No acute fracture. No  primary bone lesion or focal pathologic process. Soft tissues and spinal canal: No prevertebral fluid or swelling. No visible canal hematoma. Disc levels: Mild degenerative changes of the mid/lower cervical spine. Spinal canal is patent. Upper chest: Evaluated on dedicated CT chest. Other: None. IMPRESSION: Mild right facial soft tissue swelling. No evidence of maxillofacial fracture. No traumatic injury to the cervical spine. Mild degenerative changes. Electronically Signed   By: Julian Hy M.D.   On: 05/03/2022 18:28   CT CHEST ABDOMEN PELVIS W CONTRAST  Result Date: 05/03/2022 CLINICAL DATA:  Polytrauma, blunt fall EXAM: CT CHEST, ABDOMEN, AND PELVIS WITH CONTRAST TECHNIQUE: Multidetector CT imaging of the chest, abdomen and pelvis was performed following the standard protocol during bolus administration of intravenous contrast. RADIATION DOSE REDUCTION: This exam was performed according to the departmental dose-optimization program which includes automated exposure control, adjustment of the mA and/or kV according to patient size and/or use of iterative reconstruction technique. CONTRAST:  127m OMNIPAQUE IOHEXOL 300 MG/ML  SOLN COMPARISON:  CT abdomen pelvis 04/15/2022.  CT chest 11/04/2020 FINDINGS: CT CHEST FINDINGS Cardiovascular: Heart size is normal. No pericardial effusion. Thoracic aorta is normal in course and caliber. Scattered atherosclerotic calcifications of the aorta. Central pulmonary vasculature is within normal limits. Mediastinum/Nodes: No enlarged mediastinal, hilar, or axillary lymph nodes. Thyroid gland, trachea, and esophagus demonstrate no significant findings. Lungs/Pleura: Small dependent opacity in the right lower lobe, atelectasis or pneumonia. 10 mm part solid subpleural nodule in the posterior right lower lobe (series 5, image 108), present on the previous exam, although solid component appears slightly increased. Additional scattered areas of subtle nodularity within the  bilateral lung bases are similar in appearance to prior. No pleural effusion or pneumothorax. Musculoskeletal: Partially visualized soft tissue swelling and ill-defined fluid at the posterior aspect of the right shoulder with asymmetric enlargement of the posterior right deltoid muscle (series 3, images 1-7). Intramuscular collection within the deltoid muscle is not excluded. No acute osseous abnormality. Mildly exaggerated thoracic kyphosis. CT ABDOMEN PELVIS FINDINGS Hepatobiliary: No hepatic injury or perihepatic hematoma. Gallbladder is unremarkable. Pancreas: Unremarkable. No pancreatic ductal dilatation or surrounding inflammatory changes. Spleen: No splenic injury or perisplenic hematoma. Adrenals/Urinary Tract: No adrenal hemorrhage or renal injury identified. No renal stone or hydronephrosis. Bladder is unremarkable. Stomach/Bowel: Stomach is within normal limits. No evidence of bowel wall thickening, distention, or inflammatory changes. Moderate volume stool distending the rectum. Vascular/Lymphatic: Aortic atherosclerosis. No enlarged abdominal or pelvic lymph nodes. Reproductive: Prior prostatectomy. Other: No free fluid. No abdominopelvic fluid collection. No pneumoperitoneum. No abdominal wall hernia. Musculoskeletal: Soft tissue swelling with ill-defined fluid overlying posterior right gluteal region (series 3, image 130). No well-defined fluid collection or hematoma. No acute osseous abnormality is identified. Pelvic bony ring intact. Degenerative changes of both hips. Advanced multilevel lumbar spondylosis, similar in appearance to prior. IMPRESSION: 1. Partially visualized soft tissue swelling and ill-defined fluid at the posterior aspect of the right shoulder with asymmetric enlargement of the posterior right deltoid muscle, likely posttraumatic. Intramuscular hematoma within the deltoid muscle is  not excluded. 2. Soft tissue swelling with ill-defined fluid overlying the right gluteal region  without well-defined fluid collection or hematoma. 3. Small dependent opacity in the right lower lobe, atelectasis or pneumonia. 4. Multiple pulmonary nodules including a 10 mm part solid subpleural nodule in the posterior right lower lobe, present on the previous exam, although solid component appears slightly increased. Indolent neoplasm such as adenocarcinoma is not excluded. Consider one of the following in 3 months for both low-risk and high-risk individuals: (a) repeat chest CT, (b) follow-up PET-CT, or (c) tissue sampling. This recommendation follows the consensus statement: Guidelines for Management of Incidental Pulmonary Nodules Detected on CT Images: From the Fleischner Society 2017; Radiology 2017; 284:228-243. 5. No acute/traumatic abdominopelvic pathology. 6. Moderate volume stool distending the rectum. Correlate for constipation. 7. Aortic atherosclerosis (ICD10-I70.0). Electronically Signed   By: Davina Poke D.O.   On: 05/03/2022 18:38   DG Shoulder Right Port  Result Date: 05/04/2022 CLINICAL DATA:  Right shoulder pain and bruising. EXAM: RIGHT SHOULDER - 1 VIEW COMPARISON:  CT chest 05/03/2022. FINDINGS: The mineralization and alignment are normal. There is no evidence of acute fracture or dislocation. There are moderate glenohumeral degenerative changes. There is surrounding soft tissues appear unremarkable. IMPRESSION: Moderate glenohumeral degenerative changes. No acute osseous findings. Electronically Signed   By: Richardean Sale M.D.   On: 05/04/2022 14:16   CT Maxillofacial Wo Contrast  Result Date: 05/03/2022 CLINICAL DATA:  Trauma, right facial swelling EXAM: CT MAXILLOFACIAL WITHOUT CONTRAST CT CERVICAL SPINE WITHOUT CONTRAST TECHNIQUE: Multidetector CT imaging of the maxillofacial structures was performed. Multiplanar CT image reconstructions were also generated. A small metallic BB was placed on the right temple in order to reliably differentiate right from left.  Multidetector CT imaging of the cervical spine was performed without intravenous contrast. Multiplanar CT image reconstructions were also generated. RADIATION DOSE REDUCTION: This exam was performed according to the departmental dose-optimization program which includes automated exposure control, adjustment of the mA and/or kV according to patient size and/or use of iterative reconstruction technique. COMPARISON:  Concurrent CT head. FINDINGS: CT MAXILLOFACIAL FINDINGS Osseous: No evidence of maxillofacial fracture. Nasal bones are intact. Mandibles intact. Bilateral mandibular condyles are well-seated in the TMJs. Orbits: Bilateral orbits, including the globes and retroconal soft tissues, are within normal limits. Sinuses: The visualized paranasal sinuses are essentially clear. The mastoid air cells are unopacified. Soft tissues: Mild soft tissue swelling overlying the right frontal bone, right orbit, and right lateral zygoma/maxilla. Limited intracranial: Evaluated on dedicated CT head. CT CERVICAL FINDINGS Alignment: Normal cervical lordosis. Skull base and vertebrae: No acute fracture. No primary bone lesion or focal pathologic process. Soft tissues and spinal canal: No prevertebral fluid or swelling. No visible canal hematoma. Disc levels: Mild degenerative changes of the mid/lower cervical spine. Spinal canal is patent. Upper chest: Evaluated on dedicated CT chest. Other: None. IMPRESSION: Mild right facial soft tissue swelling. No evidence of maxillofacial fracture. No traumatic injury to the cervical spine. Mild degenerative changes. Electronically Signed   By: Julian Hy M.D.   On: 05/03/2022 18:28    Assessment/Plan 1. Bilateral hip pain Xray in facility has been negative CT scan of Pelvis in the hospital was negative for Fracture Severe arthritis Will continue Oxycodone Also Start on robaxin 250 mg  Ortho referal made  2. Fall, sequela ? Etiology Has appointment with Neuro to rule out  Parkinson disease Has started working with therapy  3. Traumatic rhabdomyolysis, sequela   4. Pulmonary nodules Has history of  Nodules  Follow up with his Pulmonary in [facility]  5. AKI (acute kidney injury) (HCC) Creat stable now  6. Slow transit constipation Doing well with Miralax  7. Hyperlipidemia, unspecified hyperlipidemia type Statin on hold for now Will start in few weeks  8. H/O hematuria Per patient his work up with Urology was negative  9. Hypokalemia Was supplemented Repeat BMP    Family/ staff Communication:   Labs/tests ordered:BMP and CBC in 1 week

## 2022-05-12 LAB — COMPREHENSIVE METABOLIC PANEL
Calcium: 8.4 — AB (ref 8.7–10.7)
eGFR: 87

## 2022-05-12 LAB — BASIC METABOLIC PANEL
BUN: 16 (ref 4–21)
CO2: 26 — AB (ref 13–22)
Chloride: 105 (ref 99–108)
Creatinine: 0.8 (ref 0.6–1.3)
Glucose: 107
Potassium: 4.1 mEq/L (ref 3.5–5.1)
Sodium: 140 (ref 137–147)

## 2022-05-13 DIAGNOSIS — M25551 Pain in right hip: Secondary | ICD-10-CM | POA: Diagnosis not present

## 2022-05-13 DIAGNOSIS — M25552 Pain in left hip: Secondary | ICD-10-CM | POA: Diagnosis not present

## 2022-05-13 HISTORY — DX: Pain in right hip: M25.551

## 2022-05-13 HISTORY — DX: Pain in left hip: M25.552

## 2022-05-14 NOTE — Progress Notes (Unsigned)
Assessment/Plan:    ***  Subjective:   Johnny [name] was seen today in the movement disorders clinic for neurologic consultation at the request of Elodia Florence.,*.  The consultation is for the evaluation of gait dysfunction and to rule out Parkinson's disease.  Medical records made available to me are reviewed, including his recent hospital records.  Patient was recently admitted to the hospital.  [facility], and was found down by staff.  Patient had suffered acute rhabdomyolysis due to his fall and presented to the hospital with mental status change.  The hospital had gotten the history that patient had been falling more and there was a possible concern for Parkinson's.  He is sent here for further evaluation.   Specific Symptoms:  Tremor: {yes no:314532} Family hx of similar:  {yes no:314532} Voice: *** Sleep: ***  Vivid Dreams:  {yes no:314532}  Acting out dreams:  {yes no:314532} Wet Pillows: {yes no:314532} Postural symptoms:  {yes no:314532}  Falls?  {yes no:314532} Bradykinesia symptoms: {parkinson brady:18041} Loss of smell:  {yes no:314532} Loss of taste:  {yes no:314532} Urinary Incontinence:  {yes no:314532} Difficulty Swallowing:  {yes no:314532} Handwriting, micrographia: {yes no:314532} Trouble with ADL's:  {yes no:314532}  Trouble buttoning clothing: {yes no:314532} Depression:  {yes no:314532} Memory changes:  {yes no:314532} Hallucinations:  {yes no:314532}  visual distortions: {yes no:314532} N/V:  {yes no:314532} Lightheaded:  {yes no:314532}  Syncope: {yes no:314532} Diplopia:  {yes no:314532} Dyskinesia:  {yes no:314532} Prior exposure to reglan/antipsychotics: {yes no:314532}  Neuroimaging of the brain has *** previously been performed.  Patient had CT brain May 03, 2022 after his fall.  There was right-sided scalp swelling, but it was otherwise nonacute.  PREVIOUS MEDICATIONS: {Parkinson's  RX:18200}  ALLERGIES:  No Known Allergies  CURRENT MEDICATIONS:  No outpatient medications have been marked as taking for the 05/18/22 encounter (Appointment) with Dmiya Malphrus, Eustace Quail, DO.     Objective:   VITALS:  There were no vitals filed for this visit.  GEN:  The patient appears stated age and is in NAD. HEENT:  Normocephalic, atraumatic.  The mucous membranes are moist. The superficial temporal arteries are without ropiness or tenderness. CV:  RRR Lungs:  CTAB Neck/HEME:  There are no carotid bruits bilaterally.  Neurological examination:  Orientation: The patient is alert and oriented x3.  Cranial nerves: There is good facial symmetry. Extraocular muscles are intact. The visual fields are full to confrontational testing. The speech is fluent and clear. Soft palate rises symmetrically and there is no tongue deviation. Hearing is intact to conversational tone. Sensation: Sensation is intact to light and pinprick throughout (facial, trunk, extremities). Vibration is intact at the bilateral big toe. There is no extinction with double simultaneous stimulation. There is no sensory dermatomal level identified. Motor: Strength is 5/5 in the bilateral upper and lower extremities.   Shoulder shrug is equal and symmetric.  There is no pronator drift. Deep tendon reflexes: Deep tendon reflexes are 2/4 at the bilateral biceps, triceps, brachioradialis, patella and achilles. Plantar responses are downgoing bilaterally.  Movement examination: Tone: There is ***tone in the bilateral upper extremities.  The tone in the lower extremities is ***.  Abnormal movements: *** Coordination:  There is *** decremation with RAM's, *** Gait and Station: The patient has *** difficulty arising out of a deep-seated chair without the use of the hands. The patient's stride length is ***.  The patient has a *** pull test.  I have reviewed and interpreted the following labs independently   Chemistry      Component  Value Date/Time   NA 139 05/10/2022 0000   K 3.3 (A) 05/10/2022 0000   CL 106 05/10/2022 0000   CO2 29 (A) 05/10/2022 0000   BUN 19 05/10/2022 0000   CREATININE 0.9 05/10/2022 0000   CREATININE 1.26 (H) 05/07/2022 0623   GLU 103 05/10/2022 0000      Component Value Date/Time   CALCIUM 7.8 (A) 05/10/2022 0000   ALKPHOS 56 05/10/2022 0000   AST 28 05/10/2022 0000   ALT 19 05/10/2022 0000   BILITOT 0.9 05/07/2022 0623      Lab Results  Component Value Date   TSH 2.758 05/03/2022   Lab Results  Component Value Date   WBC 5.0 05/10/2022   HGB 10.0 (A) 05/10/2022   HCT 30 (A) 05/10/2022   MCV 93.5 05/07/2022   PLT 132 (A) 05/10/2022     Total time spent on today's visit was ***60 minutes, including both face-to-face time and nonface-to-face time.  Time included that spent on review of records (prior notes available to me/labs/imaging if pertinent), discussing treatment and goals, answering patient's questions and coordinating care.  Cc:  [name], MD

## 2022-05-18 ENCOUNTER — Encounter: Payer: Self-pay | Admitting: Neurology

## 2022-05-18 ENCOUNTER — Ambulatory Visit (INDEPENDENT_AMBULATORY_CARE_PROVIDER_SITE_OTHER): Payer: Medicare Other | Admitting: Neurology

## 2022-05-18 VITALS — BP 100/58 | HR 83 | Ht 70.0 in | Wt 151.0 lb

## 2022-05-18 DIAGNOSIS — R253 Fasciculation: Secondary | ICD-10-CM | POA: Diagnosis not present

## 2022-05-18 DIAGNOSIS — R413 Other amnesia: Secondary | ICD-10-CM | POA: Diagnosis not present

## 2022-05-18 DIAGNOSIS — K117 Disturbances of salivary secretion: Secondary | ICD-10-CM

## 2022-05-18 DIAGNOSIS — G20 Parkinson's disease: Secondary | ICD-10-CM | POA: Diagnosis not present

## 2022-05-18 DIAGNOSIS — G253 Myoclonus: Secondary | ICD-10-CM

## 2022-05-18 DIAGNOSIS — M542 Cervicalgia: Secondary | ICD-10-CM

## 2022-05-18 LAB — BASIC METABOLIC PANEL
BUN: 19 (ref 4–21)
CO2: 30 — AB (ref 13–22)
Chloride: 103 (ref 99–108)
Creatinine: 1.1 (ref 0.6–1.3)
Glucose: 90
Potassium: 4.7 mEq/L (ref 3.5–5.1)
Sodium: 139 (ref 137–147)

## 2022-05-18 LAB — CBC AND DIFFERENTIAL
HCT: 31 — AB (ref 41–53)
Hemoglobin: 10.4 — AB (ref 13.5–17.5)
Neutrophils Absolute: 4065
Platelets: 230 10*3/uL (ref 150–400)
WBC: 5.9

## 2022-05-18 LAB — COMPREHENSIVE METABOLIC PANEL
Calcium: 9 (ref 8.7–10.7)
eGFR: 71

## 2022-05-18 LAB — CBC: RBC: 3.14 — AB (ref 3.87–5.11)

## 2022-05-18 MED ORDER — CARBIDOPA-LEVODOPA 25-100 MG PO TABS: ORAL_TABLET | ORAL | 1 refills | Status: DC

## 2022-05-18 NOTE — Patient Instructions (Signed)
You have been referred for a neurocognitive evaluation (i.e., evaluation of memory and thinking abilities). Please bring someone with you to this appointment if possible, as it is helpful for the neuropsychologist to hear from both you and another adult who knows you well. Please bring eyeglasses and hearing aids if you wear them and take any medications as you normally would.    The evaluation will take approximately 2-3 hours and has two parts:   The first part is a clinical interview with the neuropsychologist, Dr. Merz.  During the interview, the neuropsychologist will speak with you and the individual you brought to the appointment.    The second part of the evaluation is testing with the doctor's technician, aka psychometrician, Dana or Kim. During the testing, the technician will ask you to remember different types of material, solve problems, and answer some questionnaires. Your family member will not be present for this portion of the evaluation.   Please note: We have to reserve several hours of the neuropsychologist's time and the psychometrician's time for your evaluation appointment. As such, there is a No-Show fee of $100. If you are unable to attend any of your appointments, please contact our office as soon as possible to reschedule.  

## 2022-05-27 ENCOUNTER — Non-Acute Institutional Stay (SKILLED_NURSING_FACILITY): Payer: Medicare Other | Admitting: Adult Health

## 2022-05-27 ENCOUNTER — Encounter: Payer: Self-pay | Admitting: Adult Health

## 2022-05-27 DIAGNOSIS — G20 Parkinson's disease: Secondary | ICD-10-CM | POA: Diagnosis not present

## 2022-05-27 DIAGNOSIS — F5101 Primary insomnia: Secondary | ICD-10-CM | POA: Diagnosis not present

## 2022-05-27 NOTE — Progress Notes (Unsigned)
Location:      Place of Service:    Provider:  [name], DNP, FNP-BC  Patient Care Team: Johnny Bill, MD as PCP - General (Family Medicine) Johnny Clines, MD (Inactive) as Consulting Physician (Urology) Johnny Boston, MD as Consulting Physician (General Surgery) Johnny Dragon, MD (Inactive) as Consulting Physician (Gastroenterology) Johnny Cola, MD as Consulting Physician (Otolaryngology) Tat, Eustace Quail, DO as Consulting Physician (Neurology)  Extended Emergency Contact Information Primary Emergency Contact: Johnny [name] Address: [address] [place] [address] Mobile Phone: [phone] Relation: Son Interpreter needed? No Secondary Emergency Contact: Johnny [name] Address: [place] [address] Mobile Phone: [phone] Relation: Daughter  Code Status:  FULL  Goals of care: Advanced Directive information    05/27/2022    4:05 PM  Advanced Directives  Does Patient Have a Medical Advance Directive? Yes  Type of Advance Directive [place]  Does patient want to make changes to medical advance directive? No - Patient declined     Chief Complaint  Patient presents with   Medical Management of Chronic Issues    Patient is here for a follow up for chronic conditions     HPI:  Pt is a 81 y.o. [name] seen today for medical management of chronic diseases.  ***   Past Medical History:  Diagnosis Date   Dysphagia, pharyngoesophageal phase 02/14/2014   GERD (gastroesophageal reflux disease)    History of prostate cancer    S/P PROSTATECTOMY 2009   Hydrocele    SCROTAL   Hyperlipidemia    Organic impotence    Wears glasses    Past Surgical History:  Procedure Laterality Date   BRAVO [place] STUDY N/A 05/06/2014   Procedure: BRAVO [place] STUDY;  Surgeon: Johnny Dragon, MD;  Location: [facility];  Service: Endoscopy;  Laterality: N/A;   ESOPHAGOGASTRODUODENOSCOPY (EGD) WITH PROPOFOL N/A  05/06/2014   Procedure: ESOPHAGOGASTRODUODENOSCOPY (EGD) WITH PROPOFOL;  Surgeon: Johnny Dragon, MD;  Location: [facility];  Service: Endoscopy;  Laterality: N/A;   HYDROCELE EXCISION Left 1994   HYDROCELE EXCISION Right 10/15/2013   Procedure: HYDROCELECTOMY ADULT;  Surgeon: Ailene Rud, MD;  Location: [facility];  Service: Urology;  Laterality: Right;  with excision of multiple epidyidmal cyst   HYDROCELE EXCISION Left 10/14/2014   Procedure: EPIDIDYMAL CYST EXCISION, LEFT;  Surgeon: Ailene Rud, MD;  Location: [facility];  Service: Urology;  Laterality: Left;   INGUINAL HERNIA REPAIR Bilateral 10/15/2013   Procedure:  LAPAROSCOPIC EXPLORATION  with REPAIR OF BILATERAL INGUINAL HERNIA;  Surgeon: Adin Hector, MD;  Location: [facility];  Service: General;  Laterality: Bilateral;   INSERTION OF MESH Bilateral 10/15/2013   Procedure: INSERTION OF MESH;  Surgeon: Adin Hector, MD;  Location: [facility];  Service: General;  Laterality: Bilateral;   PILONIDAL CYST EXCISION  1970'S   ROBOT ASSISTED LAPAROSCOPIC RADICAL PROSTATECTOMY  01-01-2008  DR Hubbard Right 11-11-2005   TONSILLECTOMY     child    No Known Allergies  Outpatient Encounter Medications as of 05/27/2022  Medication Sig   acetaminophen (TYLENOL) 325 MG tablet Take 650 mg by mouth every 4 (four) hours as needed. for fever greater than 100.6 x 48 hours. Notify MD of initial fever and temperature greater than 101. Do not exceed 3,000 mg in 24 hours  Ascorbic Acid (VITAMIN C PO) Take 1 tablet by mouth daily.   aspirin EC 81 MG tablet Take 81 mg by mouth daily. Swallow whole.   carbidopa-levodopa (SINEMET IR) 25-100 MG tablet Start Carbidopa Levodopa as follows: Take 1/2 tablet three times daily, at least 30 minutes before meals (approximately 7am/11am/4pm), for one week Then take 1/2 tablet at 7am, 1/2 tablet at 11am, 1  tablet at 4pm, at least 30 minutes before meals, for one week Then take 1/2 tablet at 7am, 1 tablet at 11am, 1 tablet at 4pm, at least 30 minutes before meals, for one week Then take 1 tablet three times daily at 7am/11am/4pm, at least 30 minutes before meals   cetirizine (ZYRTEC) 10 MG tablet Take 10 mg by mouth daily.   Cholecalciferol (VITAMIN D3 PO) Take 1 tablet by mouth daily.   dextromethorphan-guaiFENesin (MUCINEX DM) 30-600 MG per 12 hr tablet Take 1 tablet by mouth 2 (two) times daily.   FOLIC ACID PO Take 1 mg by mouth daily.   methocarbamol (ROBAXIN) 500 MG tablet Take 500 mg by mouth every 6 (six) hours as needed for muscle spasms.   Multiple Vitamin (MULTIVITAMIN WITH MINERALS) TABS tablet Take 1 tablet by mouth daily.   Multiple Vitamins-Minerals (ZINC PO) Take 1 tablet by mouth daily.   omeprazole (PRILOSEC) 40 MG capsule Take 40 mg by mouth at bedtime.   oxyCODONE (OXY IR/ROXICODONE) 5 MG immediate release tablet Take 1 tablet (5 mg total) by mouth every 6 (six) hours as needed for severe pain.   polyethylene glycol (MIRALAX / GLYCOLAX) 17 g packet Take 17 g by mouth daily.   Facility-Administered Encounter Medications as of 05/27/2022  Medication   bupivacaine (MARCAINE) 0.5 % (with pres) injection 50 mL    Review of Systems ***    Immunization History  Administered Date(s) Administered   Influenza Split 09/10/2010, 09/01/2011, 09/06/2012, 08/09/2014, 08/25/2015, 09/05/2016   Influenza, High Dose Seasonal PF 08/16/2017, 09/05/2018, 07/25/2019   Influenza-Unspecified 08/09/2014, 08/25/2015, 09/05/2016   PFIZER Comirnaty(Gray Top)Covid-19 Tri-Sucrose Vaccine 12/25/2019, 01/15/2020, 03/25/2021   PFIZER(Purple Top)SARS-COV-2 Vaccination 12/25/2019, 01/15/2020, 09/02/2020   PNEUMOCOCCAL CONJUGATE-20 03/25/2021   Pneumococcal Conjugate-13 09/12/2012   Pneumococcal Polysaccharide-23 09/07/2004   Pertinent  Health Maintenance Due  Topic Date Due   INFLUENZA VACCINE   07/06/2022      05/05/2022    9:15 PM 05/06/2022   10:18 AM 05/06/2022    9:00 PM 05/07/2022    8:45 AM 05/18/2022    9:51 AM  Fall Risk  Falls in the past year?     1  Was there an injury with Fall?     1  Fall Risk Category Calculator     3  Fall Risk Category     High  Patient Fall Risk Level High fall risk High fall risk High fall risk High fall risk High fall risk     Vitals:   05/27/22 1607  BP: 119/68  Pulse: 76  Resp: 18  Temp: 98 F (36.7 C)  SpO2: 97%  Weight: 158 lb 11.2 oz (72 kg)  Height: '5\' 10"'$  (1.778 m)   Body mass index is 22.77 kg/m.  Physical Exam     Labs reviewed: Recent Labs    05/04/22 0010 05/06/22 0618 05/06/22 1841 05/07/22 0623 05/10/22 0000  NA 142 140  --  141 139  K 3.2* 3.2*  --  3.0* 3.3*  CL 106 111  --  110 106  CO2 25 24  --  25  29*  GLUCOSE 94 110*  --  113*  --   BUN 31* 25*  --  17 19  CREATININE 0.90 1.41* 1.30* 1.26* 0.9  CALCIUM 8.9 8.2*  --  8.2* 7.8*  MG  --  1.9  --  2.0  --   PHOS  --  3.3  --  3.5  --    Recent Labs    05/04/22 0010 05/06/22 0618 05/07/22 0623 05/10/22 0000  AST 165* 75* 79* 28  ALT 40 33 32 19  ALKPHOS 53 52 54 56  BILITOT 1.0 0.6 0.9  --   PROT 5.4* 4.4* 4.7*  --   ALBUMIN 2.9* 2.3* 2.3* 2.4*   Recent Labs    05/04/22 0010 05/06/22 0618 05/07/22 0623 05/10/22 0000  WBC 9.4 6.0 6.8 5.0  NEUTROABS 7.6 4.6 5.2 3,300.00  HGB 13.1 10.8* 11.3* 10.0*  HCT 37.8* 31.2* 31.6* 30*  MCV 93.8 94.0 93.5  --   PLT 153 124* 127* 132*   Lab Results  Component Value Date   TSH 2.758 05/03/2022   No results found for: "HGBA1C" No results found for: "CHOL", "HDL", "LDLCALC", "LDLDIRECT", "TRIG", "CHOLHDL"  Significant Diagnostic Results in last 30 days:  [place] RT UPPER EXTREM LTD SOFT TISSUE NON VASCULAR  Result Date: 05/07/2022 CLINICAL DATA:  Hematuria.  Patient fall. EXAM: ULTRASOUND RIGHT UPPER EXTREMITY LIMITED TECHNIQUE: Ultrasound examination of the upper extremity soft tissues was  performed in the area of clinical concern. COMPARISON:  None Available. FINDINGS: In the area of bruising along the right shoulder, no substantial masslike hematoma or collection is identified. No cystic lesion noted. IMPRESSION: 1. No confluent hematoma, mass, or cystic collection is identified by sonography in the region of bruising along the shoulder. Electronically Signed   By: Van [name] M.D.   On: 05/07/2022 08:28   DG Shoulder Right Port  Result Date: 05/04/2022 CLINICAL DATA:  Right shoulder pain and bruising. EXAM: RIGHT SHOULDER - 1 VIEW COMPARISON:  CT chest 05/03/2022. FINDINGS: The mineralization and alignment are normal. There is no evidence of acute fracture or dislocation. There are moderate glenohumeral degenerative changes. There is surrounding soft tissues appear unremarkable. IMPRESSION: Moderate glenohumeral degenerative changes. No acute osseous findings. Electronically Signed   By: Richardean Sale M.D.   On: 05/04/2022 14:16   CT CHEST ABDOMEN PELVIS W CONTRAST  Result Date: 05/03/2022 CLINICAL DATA:  Polytrauma, blunt fall EXAM: CT CHEST, ABDOMEN, AND PELVIS WITH CONTRAST TECHNIQUE: Multidetector CT imaging of the chest, abdomen and pelvis was performed following the standard protocol during bolus administration of intravenous contrast. RADIATION DOSE REDUCTION: This exam was performed according to the departmental dose-optimization program which includes automated exposure control, adjustment of the mA and/or kV according to patient size and/or use of iterative reconstruction technique. CONTRAST:  123m OMNIPAQUE IOHEXOL 300 MG/ML  SOLN COMPARISON:  CT abdomen pelvis 04/15/2022.  CT chest 11/04/2020 FINDINGS: CT CHEST FINDINGS Cardiovascular: Heart size is normal. No pericardial effusion. Thoracic aorta is normal in course and caliber. Scattered atherosclerotic calcifications of the aorta. Central pulmonary vasculature is within normal limits. Mediastinum/Nodes: No enlarged  mediastinal, hilar, or axillary lymph nodes. Thyroid gland, trachea, and esophagus demonstrate no significant findings. Lungs/Pleura: Small dependent opacity in the right lower lobe, atelectasis or pneumonia. 10 mm part solid subpleural nodule in the posterior right lower lobe (series 5, image 108), present on the previous exam, although solid component appears slightly increased. Additional scattered areas of subtle nodularity within the bilateral lung bases are  similar in appearance to prior. No pleural effusion or pneumothorax. Musculoskeletal: Partially visualized soft tissue swelling and ill-defined fluid at the posterior aspect of the right shoulder with asymmetric enlargement of the posterior right deltoid muscle (series 3, images 1-7). Intramuscular collection within the deltoid muscle is not excluded. No acute osseous abnormality. Mildly exaggerated thoracic kyphosis. CT ABDOMEN PELVIS FINDINGS Hepatobiliary: No hepatic injury or perihepatic hematoma. Gallbladder is unremarkable. Pancreas: Unremarkable. No pancreatic ductal dilatation or surrounding inflammatory changes. Spleen: No splenic injury or perisplenic hematoma. Adrenals/Urinary Tract: No adrenal hemorrhage or renal injury identified. No renal stone or hydronephrosis. Bladder is unremarkable. Stomach/Bowel: Stomach is within normal limits. No evidence of bowel wall thickening, distention, or inflammatory changes. Moderate volume stool distending the rectum. Vascular/Lymphatic: Aortic atherosclerosis. No enlarged abdominal or pelvic lymph nodes. Reproductive: Prior prostatectomy. Other: No free fluid. No abdominopelvic fluid collection. No pneumoperitoneum. No abdominal wall hernia. Musculoskeletal: Soft tissue swelling with ill-defined fluid overlying posterior right gluteal region (series 3, image 130). No well-defined fluid collection or hematoma. No acute osseous abnormality is identified. Pelvic bony ring intact. Degenerative changes of both  hips. Advanced multilevel lumbar spondylosis, similar in appearance to prior. IMPRESSION: 1. Partially visualized soft tissue swelling and ill-defined fluid at the posterior aspect of the right shoulder with asymmetric enlargement of the posterior right deltoid muscle, likely posttraumatic. Intramuscular hematoma within the deltoid muscle is not excluded. 2. Soft tissue swelling with ill-defined fluid overlying the right gluteal region without well-defined fluid collection or hematoma. 3. Small dependent opacity in the right lower lobe, atelectasis or pneumonia. 4. Multiple pulmonary nodules including a 10 mm part solid subpleural nodule in the posterior right lower lobe, present on the previous exam, although solid component appears slightly increased. Indolent neoplasm such as adenocarcinoma is not excluded. Consider one of the following in 3 months for both low-risk and high-risk individuals: (a) repeat chest CT, (b) follow-up PET-CT, or (c) tissue sampling. This recommendation follows the consensus statement: Guidelines for Management of Incidental Pulmonary Nodules Detected on CT Images: From the Fleischner Society 2017; Radiology 2017; 284:228-243. 5. No acute/traumatic abdominopelvic pathology. 6. Moderate volume stool distending the rectum. Correlate for constipation. 7. Aortic atherosclerosis (ICD10-I70.0). Electronically Signed   By: Davina Poke D.O.   On: 05/03/2022 18:38   CT Cervical Spine Wo Contrast  Result Date: 05/03/2022 CLINICAL DATA:  Trauma, right facial swelling EXAM: CT MAXILLOFACIAL WITHOUT CONTRAST CT CERVICAL SPINE WITHOUT CONTRAST TECHNIQUE: Multidetector CT imaging of the maxillofacial structures was performed. Multiplanar CT image reconstructions were also generated. A small metallic BB was placed on the right temple in order to reliably differentiate right from left. Multidetector CT imaging of the cervical spine was performed without intravenous contrast. Multiplanar CT image  reconstructions were also generated. RADIATION DOSE REDUCTION: This exam was performed according to the departmental dose-optimization program which includes automated exposure control, adjustment of the mA and/or kV according to patient size and/or use of iterative reconstruction technique. COMPARISON:  Concurrent CT head. FINDINGS: CT MAXILLOFACIAL FINDINGS Osseous: No evidence of maxillofacial fracture. Nasal bones are intact. Mandibles intact. Bilateral mandibular condyles are well-seated in the TMJs. Orbits: Bilateral orbits, including the globes and retroconal soft tissues, are within normal limits. Sinuses: The visualized paranasal sinuses are essentially clear. The mastoid air cells are unopacified. Soft tissues: Mild soft tissue swelling overlying the right frontal bone, right orbit, and right lateral zygoma/maxilla. Limited intracranial: Evaluated on dedicated CT head. CT CERVICAL FINDINGS Alignment: Normal cervical lordosis. Skull base and vertebrae: No acute  fracture. No primary bone lesion or focal pathologic process. Soft tissues and spinal canal: No prevertebral fluid or swelling. No visible canal hematoma. Disc levels: Mild degenerative changes of the mid/lower cervical spine. Spinal canal is patent. Upper chest: Evaluated on dedicated CT chest. Other: None. IMPRESSION: Mild right facial soft tissue swelling. No evidence of maxillofacial fracture. No traumatic injury to the cervical spine. Mild degenerative changes. Electronically Signed   By: Julian Hy M.D.   On: 05/03/2022 18:28   CT Maxillofacial Wo Contrast  Result Date: 05/03/2022 CLINICAL DATA:  Trauma, right facial swelling EXAM: CT MAXILLOFACIAL WITHOUT CONTRAST CT CERVICAL SPINE WITHOUT CONTRAST TECHNIQUE: Multidetector CT imaging of the maxillofacial structures was performed. Multiplanar CT image reconstructions were also generated. A small metallic BB was placed on the right temple in order to reliably differentiate right from  left. Multidetector CT imaging of the cervical spine was performed without intravenous contrast. Multiplanar CT image reconstructions were also generated. RADIATION DOSE REDUCTION: This exam was performed according to the departmental dose-optimization program which includes automated exposure control, adjustment of the mA and/or kV according to patient size and/or use of iterative reconstruction technique. COMPARISON:  Concurrent CT head. FINDINGS: CT MAXILLOFACIAL FINDINGS Osseous: No evidence of maxillofacial fracture. Nasal bones are intact. Mandibles intact. Bilateral mandibular condyles are well-seated in the TMJs. Orbits: Bilateral orbits, including the globes and retroconal soft tissues, are within normal limits. Sinuses: The visualized paranasal sinuses are essentially clear. The mastoid air cells are unopacified. Soft tissues: Mild soft tissue swelling overlying the right frontal bone, right orbit, and right lateral zygoma/maxilla. Limited intracranial: Evaluated on dedicated CT head. CT CERVICAL FINDINGS Alignment: Normal cervical lordosis. Skull base and vertebrae: No acute fracture. No primary bone lesion or focal pathologic process. Soft tissues and spinal canal: No prevertebral fluid or swelling. No visible canal hematoma. Disc levels: Mild degenerative changes of the mid/lower cervical spine. Spinal canal is patent. Upper chest: Evaluated on dedicated CT chest. Other: None. IMPRESSION: Mild right facial soft tissue swelling. No evidence of maxillofacial fracture. No traumatic injury to the cervical spine. Mild degenerative changes. Electronically Signed   By: Julian Hy M.D.   On: 05/03/2022 18:28   CT Head Wo Contrast  Result Date: 05/03/2022 CLINICAL DATA:  Found down, right-sided facial swelling EXAM: CT HEAD WITHOUT CONTRAST TECHNIQUE: Contiguous axial images were obtained from the base of the skull through the vertex without intravenous contrast. RADIATION DOSE REDUCTION: This exam  was performed according to the departmental dose-optimization program which includes automated exposure control, adjustment of the mA and/or kV according to patient size and/or use of iterative reconstruction technique. COMPARISON:  09/26/2020, 02/20/2014 FINDINGS: Brain: No acute infarct or hemorrhage. Lateral ventricles and midline structures are unremarkable. No acute extra-axial fluid collections. No mass effect. Vascular: No hyperdense vessel or unexpected calcification. Skull: Asymmetric right-sided scalp edema could be dependent edema given clinical history. No underlying fractures. Remainder of the calvarium is unremarkable. Sinuses/Orbits: Paranasal sinuses are unremarkable. Chronic right mastoid effusion. Other: None. IMPRESSION: 1. No acute intracranial process. 2. Right-sided scalp swelling likely due to dependent edema given history of patient being found down. Electronically Signed   By: Randa Ngo M.D.   On: 05/03/2022 18:26    Assessment/Plan ***   Family/ staff Communication: Discussed plan of care with resident and charge nurse  Labs/tests ordered:     Johnny Age, DNP, MSN, FNP-BC [facility] [phone] (Monday-Friday 8:00 a.m. - 5:00 p.m.) [phone] (after hours)

## 2022-06-03 ENCOUNTER — Encounter: Payer: Self-pay | Admitting: Adult Health

## 2022-06-03 ENCOUNTER — Non-Acute Institutional Stay (SKILLED_NURSING_FACILITY): Payer: Medicare Other | Admitting: Adult Health

## 2022-06-03 DIAGNOSIS — G20 Parkinson's disease: Secondary | ICD-10-CM

## 2022-06-03 DIAGNOSIS — I700 Atherosclerosis of aorta: Secondary | ICD-10-CM

## 2022-06-03 DIAGNOSIS — R918 Other nonspecific abnormal finding of lung field: Secondary | ICD-10-CM | POA: Diagnosis not present

## 2022-06-03 DIAGNOSIS — G20C Parkinsonism, unspecified: Secondary | ICD-10-CM

## 2022-06-03 DIAGNOSIS — T796XXS Traumatic ischemia of muscle, sequela: Secondary | ICD-10-CM | POA: Diagnosis not present

## 2022-06-03 DIAGNOSIS — K219 Gastro-esophageal reflux disease without esophagitis: Secondary | ICD-10-CM | POA: Diagnosis not present

## 2022-06-03 DIAGNOSIS — K5901 Slow transit constipation: Secondary | ICD-10-CM | POA: Diagnosis not present

## 2022-06-03 DIAGNOSIS — F5101 Primary insomnia: Secondary | ICD-10-CM

## 2022-06-03 NOTE — Progress Notes (Unsigned)
Location:  [place] Room Number: N041/A Place of Service:  SNF (31) Provider:  [name], DNP, FNP-BC  Patient Care Team: [name], MD as PCP - General (Family Medicine) Carolan Clines, MD (Inactive) as Consulting Physician (Urology) Michael Boston, MD as Consulting Physician (General Surgery) [name], MD (Inactive) as Consulting Physician (Gastroenterology) Ruby Cola, MD as Consulting Physician (Otolaryngology) Tat, Eustace Quail, DO as Consulting Physician (Neurology)  Extended Emergency Contact Information Primary Emergency Contact: Kregel,kenneth Address: [address] [place] [address] Mobile Phone: [phone] Relation: Son Interpreter needed? No Secondary Emergency Contact: Petraglia,Karen Address: [place] [address] Mobile Phone: [phone] Relation: Daughter  Code Status:  Full Code  Goals of care: Advanced Directive information    06/03/2022   10:04 AM  Advanced Directives  Does Patient Have a Medical Advance Directive? No  Does patient want to make changes to medical advance directive? No - Patient declined     Chief Complaint  Patient presents with   Discharge Note    Discharge to [facility].     HPI:  Pt is a 81 y.o. male who is for discharge to [facility] on June 07, 2022.      He was admitted to [facility] SNF on 05/07/22 post hospital admission 05/03/2022 to 05/07/22.  He has a PMH of prior prostate cancer, history of prostatectomy, reflux and hyperlipidemia.  He presented to the ER after being found on the floor in his apartment at the [facility].  The facility staff went to check on him and found him on the floor confused and EMS brought him to the ED.  CT scan of the head, cervical spine, chest, abdomen and pelvis were all negative for fractures.  He was treated for acute rhabdomyolysis secondary to prolonged immobility, fall, acute  metabolic encephalopathy probably from concussion and acute hospitalization.  Hospitalization was complicated by mild AKI.  He improved with conservative care, IVF.  CK improved overall upon discharge.  At [facility], he complained of insomnia and was started on Melatonin 5 mg at bedtime. He followed up with neurology, Dr. 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 It was suspected that he had the disease for quite sometime.  Patient was admitted to this facility for short-term rehabilitation after the patient's recent hospitalization.  Patient has completed SNF rehabilitation and therapy has cleared the patient for discharge.   Past Medical History:  Diagnosis Date   Dysphagia, pharyngoesophageal phase 02/14/2014   GERD (gastroesophageal reflux disease)    History of prostate cancer    S/P PROSTATECTOMY 2009   Hydrocele    SCROTAL   Hyperlipidemia    Organic impotence    Wears glasses    Past Surgical History:  Procedure Laterality Date   BRAVO [place] STUDY N/A 05/06/2014   Procedure: BRAVO [place] STUDY;  Surgeon: [name], MD;  Location: [facility];  Service: Endoscopy;  Laterality: N/A;   ESOPHAGOGASTRODUODENOSCOPY (EGD) WITH PROPOFOL N/A 05/06/2014   Procedure: ESOPHAGOGASTRODUODENOSCOPY (EGD) WITH PROPOFOL;  Surgeon: [name], MD;  Location: [facility];  Service: Endoscopy;  Laterality: N/A;   HYDROCELE EXCISION Left 1994   HYDROCELE EXCISION Right 10/15/2013   Procedure: HYDROCELECTOMY ADULT;  Surgeon: Ailene Rud, MD;  Location: [facility];  Service: Urology;  Laterality: Right;  with  excision of multiple epidyidmal cyst   HYDROCELE EXCISION Left 10/14/2014   Procedure: EPIDIDYMAL CYST EXCISION, LEFT;  Surgeon: Ailene Rud, MD;  Location: [facility];  Service: Urology;  Laterality: Left;   INGUINAL HERNIA REPAIR Bilateral 10/15/2013   Procedure:  LAPAROSCOPIC EXPLORATION  with REPAIR OF  BILATERAL INGUINAL HERNIA;  Surgeon: Adin Hector, MD;  Location: [place];  Service: General;  Laterality: Bilateral;   INSERTION OF MESH Bilateral 10/15/2013   Procedure: INSERTION OF MESH;  Surgeon: Adin Hector, MD;  Location: [place];  Service: General;  Laterality: Bilateral;   PILONIDAL CYST EXCISION  1970'S   ROBOT ASSISTED LAPAROSCOPIC RADICAL PROSTATECTOMY  01-01-2008  DR Movico Right 11-11-2005   TONSILLECTOMY     child    No Known Allergies  Outpatient Encounter Medications as of 06/03/2022  Medication Sig   aspirin EC 81 MG tablet Take 81 mg by mouth daily. Swallow whole.   atorvastatin (LIPITOR) 20 MG tablet Take 20 mg by mouth daily.   carbidopa-levodopa (SINEMET IR) 25-100 MG tablet Start Carbidopa Levodopa as follows: Take 1/2 tablet three times daily, at least 30 minutes before meals (approximately 7am/11am/4pm), for one week Then take 1/2 tablet at 7am, 1/2 tablet at 11am, 1 tablet at 4pm, at least 30 minutes before meals, for one week Then take 1/2 tablet at 7am, 1 tablet at 11am, 1 tablet at 4pm, at least 30 minutes before meals, for one week Then take 1 tablet three times daily at 7am/11am/4pm, at least 30 minutes before meals   cetirizine (ZYRTEC) 10 MG tablet Take 10 mg by mouth daily.   Cholecalciferol (VITAMIN D3) 50 MCG (2000 UT) TABS Take by mouth daily.   dextromethorphan-guaiFENesin (MUCINEX DM) 30-600 MG per 12 hr tablet Take 1 tablet by mouth 2 (two) times daily.   folic acid (FOLVITE) 1 MG tablet Take 1 mg by mouth daily.   lactose free nutrition (BOOST PLUS) LIQD Take 1 Container by mouth in the morning and at bedtime.   melatonin 5 MG TABS Take 5 mg by mouth every evening. For insomnia   methocarbamol (ROBAXIN) 500 MG tablet Take 500 mg by mouth every 6 (six) hours as needed (Left hip pain).   Multiple Vitamin (MULTIVITAMIN WITH MINERALS) TABS tablet Take 1 tablet by mouth daily.    omeprazole (PRILOSEC) 40 MG capsule Take 40 mg by mouth at bedtime.   oxyCODONE (OXY IR/ROXICODONE) 5 MG immediate release tablet Take 1 tablet (5 mg total) by mouth every 6 (six) hours as needed for severe pain.   polyethylene glycol (MIRALAX / GLYCOLAX) 17 g packet Take 17 g by mouth daily.   vitamin C (ASCORBIC ACID) 500 MG tablet Take 1 tablet by mouth daily.   zinc gluconate 50 MG tablet Take 50 mg by mouth daily.   [DISCONTINUED] acetaminophen (TYLENOL) 325 MG tablet Take 650 mg by mouth every 4 (four) hours as needed. for fever greater than 100.6 x 48 hours. Notify MD of initial fever and temperature greater than 101. Do not exceed 3,000 mg in 24 hours   [DISCONTINUED] Ascorbic Acid (VITAMIN C PO) Take 1 tablet by mouth daily.   [DISCONTINUED] Cholecalciferol (VITAMIN D3 PO) Take 1 tablet by mouth daily.   [DISCONTINUED] FOLIC ACID PO Take 1 mg by mouth daily.   [DISCONTINUED] Multiple Vitamins-Minerals (ZINC PO) Take 1 tablet by mouth daily.   Facility-Administered Encounter Medications as of 06/03/2022  Medication  bupivacaine (MARCAINE) 0.5 % (with pres) injection 50 mL    Review of Systems  Constitutional:  Negative for activity change, appetite change and fever.  HENT:  Negative for sore throat.   Eyes: Negative.   Cardiovascular:  Negative for chest pain and leg swelling.  Gastrointestinal:  Negative for abdominal distention, diarrhea and vomiting.  Genitourinary:  Negative for dysuria, frequency and urgency.  Skin:  Negative for color change.  Neurological:  Negative for dizziness and headaches.  Psychiatric/Behavioral:  Negative for behavioral problems and sleep disturbance. The patient is not nervous/anxious.    ***    Immunization History  Administered Date(s) Administered   Influenza Split 09/10/2010, 09/01/2011, 09/06/2012, 08/09/2014, 08/25/2015, 09/05/2016   Influenza, High Dose Seasonal PF 08/16/2017, 09/05/2018, 07/25/2019   Influenza-Unspecified 08/09/2014,  08/25/2015, 09/05/2016   PFIZER Comirnaty(Gray Top)Covid-19 Tri-Sucrose Vaccine 12/25/2019, 01/15/2020, 03/25/2021   PFIZER(Purple Top)SARS-COV-2 Vaccination 12/25/2019, 01/15/2020, 09/02/2020   PNEUMOCOCCAL CONJUGATE-20 03/25/2021   Pneumococcal Conjugate-13 09/12/2012   Pneumococcal Polysaccharide-23 09/07/2004   Pertinent  Health Maintenance Due  Topic Date Due   INFLUENZA VACCINE  07/06/2022      05/05/2022    9:15 PM 05/06/2022   10:18 AM 05/06/2022    9:00 PM 05/07/2022    8:45 AM 05/18/2022    9:51 AM  Fall Risk  Falls in the past year?     1  Was there an injury with Fall?     1  Fall Risk Category Calculator     3  Fall Risk Category     High  Patient Fall Risk Level High fall risk High fall risk High fall risk High fall risk High fall risk     Vitals:   06/03/22 0952  BP: 132/63  Pulse: 72  Resp: 18  Temp: 97.8 F (36.6 C)  SpO2: 98%  Weight: 152 lb 1.6 oz (69 kg)  Height: '5\' 10"'$  (1.778 m)   Body mass index is 21.82 kg/m.  Physical Exam Constitutional:      Appearance: Normal appearance.  HENT:     Head: Normocephalic and atraumatic.     Mouth/Throat:     Mouth: Mucous membranes are moist.  Eyes:     Conjunctiva/sclera: Conjunctivae normal.  Cardiovascular:     Rate and Rhythm: Normal rate and regular rhythm.     Pulses: Normal pulses.     Heart sounds: Normal heart sounds.  Pulmonary:     Effort: Pulmonary effort is normal.     Breath sounds: Normal breath sounds.  Abdominal:     General: Bowel sounds are normal.     Palpations: Abdomen is soft.  Musculoskeletal:        General: No swelling. Normal range of motion.     Cervical back: Normal range of motion.  Skin:    General: Skin is warm and dry.  Neurological:     General: No focal deficit present.     Mental Status: He is alert and oriented to person, place, and time.  Psychiatric:        Mood and Affect: Mood normal.        Behavior: Behavior normal.        Thought Content: Thought content  normal.        Judgment: Judgment normal.      Labs reviewed: Recent Labs    05/04/22 0010 05/06/22 0618 05/06/22 1841 05/07/22 0623 05/10/22 0000 05/12/22 0000 05/18/22 0000  NA 142 140  --  141 139 140 139  K 3.2*  3.2*  --  3.0* 3.3* 4.1 4.7  CL 106 111  --  110 106 105 103  CO2 25 24  --  25 29* 26* 30*  GLUCOSE 94 110*  --  113*  --   --   --   BUN 31* 25*  --  '17 19 16 19  '$ CREATININE 0.90 1.41*   < > 1.26* 0.9 0.8 1.1  CALCIUM 8.9 8.2*  --  8.2* 7.8* 8.4* 9.0  MG  --  1.9  --  2.0  --   --   --   PHOS  --  3.3  --  3.5  --   --   --    < > = values in this interval not displayed.   Recent Labs    05/04/22 0010 05/06/22 0618 05/07/22 0623 05/10/22 0000  AST 165* 75* 79* 28  ALT 40 33 32 19  ALKPHOS 53 52 54 56  BILITOT 1.0 0.6 0.9  --   PROT 5.4* 4.4* 4.7*  --   ALBUMIN 2.9* 2.3* 2.3* 2.4*   Recent Labs    05/04/22 0010 05/06/22 0618 05/07/22 0623 05/10/22 0000 05/18/22 0000  WBC 9.4 6.0 6.8 5.0 5.9  NEUTROABS 7.6 4.6 5.2 3,300.00 4,065.00  HGB 13.1 10.8* 11.3* 10.0* 10.4*  HCT 37.8* 31.2* 31.6* 30* 31*  MCV 93.8 94.0 93.5  --   --   PLT 153 124* 127* 132* 230   Lab Results  Component Value Date   TSH 2.758 05/03/2022   No results found for: "HGBA1C" No results found for: "CHOL", "HDL", "LDLCALC", "LDLDIRECT", "TRIG", "CHOLHDL"  Significant Diagnostic Results in last 30 days:  [place] RT UPPER EXTREM LTD SOFT TISSUE NON VASCULAR  Result Date: 05/07/2022 CLINICAL DATA:  Hematuria.  Patient fall. EXAM: ULTRASOUND RIGHT UPPER EXTREMITY LIMITED TECHNIQUE: Ultrasound examination of the upper extremity soft tissues was performed in the area of clinical concern. COMPARISON:  None Available. FINDINGS: In the area of bruising along the right shoulder, no substantial masslike hematoma or collection is identified. No cystic lesion noted. IMPRESSION: 1. No confluent hematoma, mass, or cystic collection is identified by sonography in the region of bruising along the  shoulder. Electronically Signed   By: Van Clines M.D.   On: 05/07/2022 08:28   DG Shoulder Right Port  Result Date: 05/04/2022 CLINICAL DATA:  Right shoulder pain and bruising. EXAM: RIGHT SHOULDER - 1 VIEW COMPARISON:  CT chest 05/03/2022. FINDINGS: The mineralization and alignment are normal. There is no evidence of acute fracture or dislocation. There are moderate glenohumeral degenerative changes. There is surrounding soft tissues appear unremarkable. IMPRESSION: Moderate glenohumeral degenerative changes. No acute osseous findings. Electronically Signed   By: Richardean Sale M.D.   On: 05/04/2022 14:16    Assessment/Plan  1. Traumatic rhabdomyolysis, sequela -   Secondary to fall and prolonged immobility -    Now resolved  2. 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 Carles Collet  3.  Aortic atherosclerosis (HCC) -    Continue Lipitor and aspirin  4. Pulmonary nodules -    Has multiple pulmonary nodules including a 10 mm part solid subpleural nodule in the posterior right lower lobe -    Follow-up with Dr. Camillo Flaming, pulmonology  5. Gastroesophageal reflux disease without esophagitis -   Continue omeprazole  6. Primary insomnia -   Continue melatonin  7. Slow transit constipation -   Continue MiraLAX  DME provided:  None  Total discharge time: Greater than 30 minutes -    Greater than 50% was spent in counseling and coordination of care.  Discharge time involved coordination of the discharge process with social worker, nursing staff and therapy department.     [name], DNP, MSN, FNP-BC [facility] [phone] (Monday-Friday 8:00 a.m. - 5:00 p.m.) [phone] (after hours)

## 2022-07-08 ENCOUNTER — Telehealth: Payer: Self-pay

## 2022-07-08 MED ORDER — MELATONIN 5 MG PO TABS: 5.0000 mg | ORAL_TABLET | Freq: Every evening | ORAL | 5 refills | Status: DC

## 2022-07-08 MED ORDER — VITAMIN C 500 MG PO TABS: 500.0000 mg | ORAL_TABLET | Freq: Every day | ORAL | 5 refills | Status: DC

## 2022-07-08 MED ORDER — POLYETHYLENE GLYCOL 3350 17 G PO PACK: 17.0000 g | PACK | Freq: Every day | ORAL | 5 refills | Status: DC

## 2022-07-08 MED ORDER — OMEPRAZOLE 40 MG PO CPDR: 40.0000 mg | DELAYED_RELEASE_CAPSULE | Freq: Every day | ORAL | 5 refills | Status: DC

## 2022-07-08 MED ORDER — ASPIRIN 81 MG PO TBEC: 81.0000 mg | DELAYED_RELEASE_TABLET | Freq: Every day | ORAL | 5 refills | Status: AC

## 2022-07-08 MED ORDER — FOLIC ACID 1 MG PO TABS: 1.0000 mg | ORAL_TABLET | Freq: Every day | ORAL | 5 refills | Status: DC

## 2022-07-08 MED ORDER — ATORVASTATIN CALCIUM 20 MG PO TABS: 20.0000 mg | ORAL_TABLET | Freq: Every day | ORAL | 5 refills | Status: DC

## 2022-07-08 MED ORDER — VITAMIN D3 50 MCG (2000 UT) PO TABS: 1.0000 | ORAL_TABLET | Freq: Every day | ORAL | 5 refills | Status: AC

## 2022-07-08 NOTE — Telephone Encounter (Signed)
This encounter was created in error - please disregard.

## 2022-07-08 NOTE — Telephone Encounter (Signed)
Pharmacy called and patient needs all current medications sent to the pharmacy. Patient really concerned about running out of his carbidopa. He has 6 days left.

## 2022-07-08 NOTE — Telephone Encounter (Signed)
Patient request refill of medication.

## 2022-07-09 ENCOUNTER — Telehealth: Payer: Self-pay | Admitting: Neurology

## 2022-07-09 MED ORDER — CARBIDOPA-LEVODOPA 25-100 MG PO TABS: ORAL_TABLET | ORAL | 0 refills | Status: DC

## 2022-07-09 NOTE — Addendum Note (Signed)
Addended by: Jake Seats on: 07/09/2022 12:04 PM   Modules accepted: Orders

## 2022-07-09 NOTE — Telephone Encounter (Signed)
Amber from [place] [facility] called regarding a refill.  The callback number is [phone].

## 2022-07-09 NOTE — Telephone Encounter (Signed)
Wants medication from [place] not what he is independent living

## 2022-07-28 ENCOUNTER — Ambulatory Visit: Payer: Medicare Other | Admitting: Internal Medicine

## 2022-07-28 DIAGNOSIS — I2510 Atherosclerotic heart disease of native coronary artery without angina pectoris: Secondary | ICD-10-CM | POA: Diagnosis not present

## 2022-07-28 DIAGNOSIS — R0602 Shortness of breath: Secondary | ICD-10-CM | POA: Diagnosis not present

## 2022-07-28 DIAGNOSIS — I700 Atherosclerosis of aorta: Secondary | ICD-10-CM | POA: Diagnosis not present

## 2022-07-28 DIAGNOSIS — R911 Solitary pulmonary nodule: Secondary | ICD-10-CM | POA: Diagnosis not present

## 2022-07-28 DIAGNOSIS — R918 Other nonspecific abnormal finding of lung field: Secondary | ICD-10-CM | POA: Diagnosis not present

## 2022-07-30 DIAGNOSIS — K219 Gastro-esophageal reflux disease without esophagitis: Secondary | ICD-10-CM | POA: Diagnosis not present

## 2022-07-30 DIAGNOSIS — R911 Solitary pulmonary nodule: Secondary | ICD-10-CM | POA: Diagnosis not present

## 2022-07-30 DIAGNOSIS — R053 Chronic cough: Secondary | ICD-10-CM | POA: Diagnosis not present

## 2022-07-30 DIAGNOSIS — J383 Other diseases of vocal cords: Secondary | ICD-10-CM | POA: Diagnosis not present

## 2022-07-30 DIAGNOSIS — R0602 Shortness of breath: Secondary | ICD-10-CM | POA: Diagnosis not present

## 2022-08-18 ENCOUNTER — Non-Acute Institutional Stay: Payer: Medicare Other | Admitting: Internal Medicine

## 2022-08-18 ENCOUNTER — Encounter: Payer: Self-pay | Admitting: Internal Medicine

## 2022-08-18 VITALS — BP 132/86 | HR 79 | Temp 97.9°F | Ht 70.0 in | Wt 153.0 lb

## 2022-08-18 DIAGNOSIS — G20 Parkinson's disease: Secondary | ICD-10-CM

## 2022-08-18 DIAGNOSIS — M25551 Pain in right hip: Secondary | ICD-10-CM

## 2022-08-18 DIAGNOSIS — K219 Gastro-esophageal reflux disease without esophagitis: Secondary | ICD-10-CM

## 2022-08-18 DIAGNOSIS — K5901 Slow transit constipation: Secondary | ICD-10-CM

## 2022-08-18 DIAGNOSIS — E785 Hyperlipidemia, unspecified: Secondary | ICD-10-CM

## 2022-08-18 DIAGNOSIS — F5101 Primary insomnia: Secondary | ICD-10-CM | POA: Diagnosis not present

## 2022-08-18 DIAGNOSIS — R918 Other nonspecific abnormal finding of lung field: Secondary | ICD-10-CM | POA: Diagnosis not present

## 2022-08-18 DIAGNOSIS — R634 Abnormal weight loss: Secondary | ICD-10-CM | POA: Diagnosis not present

## 2022-08-18 DIAGNOSIS — M1711 Unilateral primary osteoarthritis, right knee: Secondary | ICD-10-CM | POA: Diagnosis not present

## 2022-08-18 DIAGNOSIS — M25552 Pain in left hip: Secondary | ICD-10-CM | POA: Diagnosis not present

## 2022-08-18 DIAGNOSIS — R4189 Other symptoms and signs involving cognitive functions and awareness: Secondary | ICD-10-CM | POA: Diagnosis not present

## 2022-08-18 NOTE — Progress Notes (Signed)
Location:  [facility] (12)  Provider:   Code Status:  Goals of Care:     08/18/2022    2:42 PM  Advanced Directives  Does Patient Have a Medical Advance Directive? Yes  Type of Paramedic of [place];Living will;Out of facility DNR (pink MOST or yellow form)  Does patient want to make changes to medical advance directive? No - Patient declined  Copy of [place] in Chart? No - copy requested     Chief Complaint  Patient presents with   Establish Care    NP to Establish Care. Wants Guidance on his new Diagnosis of Parkinsonism. Discuss need for tdap,shingrix,covid and flu or if refuses postpone. NCIR Verified    HPI: Patient is a 81 y.o. male seen today for medical management of chronic diseases.    Recent Move to [facility]   Admitted in the hospital from 05/29-06/02 after a fall When Patient was found by security on the floor in his apartment.  He does not remember falling does not know how long he was on the floor. He was admitted with acute rhabdomyolysis secondary to prolonged immobility and acute metabolic encephalopathy His mental status improved and he was discharged to SNF No cause for his fall was found. CT scan of the head was negative for any acute changes  He is now back in his apartment Also Diagnosed with Parkinson disease by Dr Tat for his tremors and Stiffness He is also having cognitive issues He was taking sinemet but it I do not think he is being complaint with that  he said that he does not know its helping him he does not see any difference.  He is being independent in his ADLs.  He states that his son has taken over his finances all his children out of state. Patient has also lost almost 30 pounds in past 6 months.  Per patient is due to the stress of moving to friend's home and trying to sell his house He did not have any acute complaints. He did want to discuss his  diagnosis and prognosis He states he uses his walker now all the time.  And has not had any fall  Diagn  Past Medical History:  Diagnosis Date   Dysphagia, pharyngoesophageal phase 02/14/2014   GERD (gastroesophageal reflux disease)    History of prostate cancer    S/P PROSTATECTOMY 2009   Hydrocele    SCROTAL   Hyperlipidemia    Organic impotence    Wears glasses     Past Surgical History:  Procedure Laterality Date   BRAVO [place] STUDY N/A 05/06/2014   Procedure: BRAVO [place];  Surgeon: [name], MD;  Location: [facility];  Service: Endoscopy;  Laterality: N/A;   ESOPHAGOGASTRODUODENOSCOPY (EGD) WITH PROPOFOL N/A 05/06/2014   Procedure: ESOPHAGOGASTRODUODENOSCOPY (EGD) WITH PROPOFOL;  Surgeon: [name], MD;  Location: [facility];  Service: Endoscopy;  Laterality: N/A;   HYDROCELE EXCISION Left 1994   HYDROCELE EXCISION Right 10/15/2013   Procedure: HYDROCELECTOMY ADULT;  Surgeon: Ailene Rud, MD;  Location: [facility];  Service: Urology;  Laterality: Right;  with excision of multiple epidyidmal cyst   HYDROCELE EXCISION Left 10/14/2014   Procedure: EPIDIDYMAL CYST EXCISION, LEFT;  Surgeon: Ailene Rud, MD;  [facility];  Service: Urology;  Laterality: Left;   INGUINAL HERNIA REPAIR Bilateral 10/15/2013   Procedure:  LAPAROSCOPIC EXPLORATION  with REPAIR OF BILATERAL INGUINAL HERNIA;  Surgeon: Adin Hector, MD;  Location: [facility];  Service: General;  Laterality: Bilateral;   INSERTION OF MESH Bilateral 10/15/2013   Procedure: INSERTION OF MESH;  Surgeon: Adin Hector, MD;  Location: [facility];  Service: General;  Laterality: Bilateral;   PILONIDAL CYST EXCISION  1970'S   ROBOT ASSISTED LAPAROSCOPIC RADICAL PROSTATECTOMY  01-01-2008  DR Ponce de Leon Right 11-11-2005   TONSILLECTOMY     child    No Known Allergies  Outpatient Encounter Medications as  of 08/18/2022  Medication Sig   ascorbic acid (VITAMIN C) 500 MG tablet Take 1 tablet (500 mg total) by mouth daily.   aspirin EC 81 MG tablet Take 1 tablet (81 mg total) by mouth daily. Swallow whole.   atorvastatin (LIPITOR) 20 MG tablet Take 1 tablet (20 mg total) by mouth daily.   carbidopa-levodopa (SINEMET IR) 25-100 MG tablet Start Carbidopa Levodopa as follows: Take 1/2 tablet three times daily, at least 30 minutes before meals (approximately 7am/11am/4pm), for one week Then take 1/2 tablet at 7am, 1/2 tablet at 11am, 1 tablet at 4pm, at least 30 minutes before meals, for one week Then take 1/2 tablet at 7am, 1 tablet at 11am, 1 tablet at 4pm, at least 30 minutes before meals, for one week Then take 1 tablet three times daily at 7am/11am/4pm, at least 30 minutes before meals   cetirizine (ZYRTEC) 10 MG tablet Take 10 mg by mouth daily.   Cholecalciferol (VITAMIN D3) 50 MCG (2000 UT) TABS Take 1 tablet by mouth daily.   dextromethorphan-guaiFENesin (MUCINEX DM) 30-600 MG per 12 hr tablet Take 1 tablet by mouth 2 (two) times daily.   folic acid (FOLVITE) 1 MG tablet Take 1 tablet (1 mg total) by mouth daily.   lactose free nutrition (BOOST PLUS) LIQD Take 1 Container by mouth in the morning and at bedtime.   melatonin 5 MG TABS Take 1 tablet (5 mg total) by mouth every evening. For insomnia   Multiple Vitamin (MULTIVITAMIN WITH MINERALS) TABS tablet Take 1 tablet by mouth daily.   omeprazole (PRILOSEC) 40 MG capsule Take 1 capsule (40 mg total) by mouth at bedtime.   polyethylene glycol (MIRALAX / GLYCOLAX) 17 g packet Take 17 g by mouth daily.   zinc gluconate 50 MG tablet Take 50 mg by mouth daily.   [DISCONTINUED] methocarbamol (ROBAXIN) 500 MG tablet Take 500 mg by mouth every 6 (six) hours as needed (Left hip pain).   [DISCONTINUED] oxyCODONE (OXY IR/ROXICODONE) 5 MG immediate release tablet Take 1 tablet (5 mg total) by mouth every 6 (six) hours as needed for severe pain.    Facility-Administered Encounter Medications as of 08/18/2022  Medication   bupivacaine (MARCAINE) 0.5 % (with pres) injection 50 mL    Review of Systems:  Review of Systems  Constitutional:  Negative for activity change, appetite change and unexpected weight change.  HENT: Negative.    Respiratory:  Negative for cough and shortness of breath.   Cardiovascular:  Negative for leg swelling.  Gastrointestinal:  Negative for constipation.  Genitourinary:  Negative for frequency.  Musculoskeletal:  Positive for gait problem. Negative for arthralgias and myalgias.  Skin: Negative.  Negative for rash.  Neurological:  Negative for dizziness and weakness.  Psychiatric/Behavioral:  Positive for confusion. Negative for sleep disturbance.   All other systems reviewed and are negative.   Health Maintenance  Topic Date Due   TETANUS/TDAP  Never done   Zoster Vaccines- Shingrix (1 of 2) Never done   COVID-19 Vaccine (7 - Pfizer risk series) 05/20/2021   INFLUENZA VACCINE  07/06/2022   Pneumonia Vaccine 89+ Years old  Completed   HPV VACCINES  Aged Out    Physical Exam: Vitals:   08/18/22 1439  BP: 132/86  Pulse: 79  Temp: 97.9 F (36.6 C)  SpO2: 99%  Weight: 153 lb (69.4 kg)  Height: '5\' 10"'$  (1.778 m)   Body mass index is 21.95 kg/m. Physical Exam Vitals reviewed.  Constitutional:      Appearance: Normal appearance.  HENT:     Head: Normocephalic.     Nose: Nose normal.     Mouth/Throat:     Mouth: Mucous membranes are moist.     Pharynx: Oropharynx is clear.  Eyes:     Pupils: Pupils are equal, round, and reactive to light.  Cardiovascular:     Rate and Rhythm: Normal rate and regular rhythm.     Pulses: Normal pulses.     Heart sounds: No murmur heard. Pulmonary:     Effort: Pulmonary effort is normal. No respiratory distress.     Breath sounds: Normal breath sounds. No rales.  Abdominal:     General: Abdomen is flat. Bowel sounds are normal.     Palpations:  Abdomen is soft.  Musculoskeletal:        General: No swelling.     Cervical back: Neck supple.  Skin:    General: Skin is warm.  Neurological:     General: No focal deficit present.     Mental Status: He is alert and oriented to person, place, and time.     Comments: Stiffness in All extremities  Psychiatric:        Mood and Affect: Mood normal.        Thought Content: Thought content normal.     Labs reviewed: Basic Metabolic Panel: Recent Labs    05/03/22 1616 05/04/22 0010 05/06/22 0618 05/06/22 1841 05/07/22 0623 05/10/22 0000 05/12/22 0000 05/18/22 0000  NA 142 142 140  --  141 139 140 139  K 3.5 3.2* 3.2*  --  3.0* 3.3* 4.1 4.7  CL 108 106 111  --  110 106 105 103  CO2 '28 25 24  '$ --  25 29* 26* 30*  GLUCOSE 111* 94 110*  --  113*  --   --   --   BUN 37* 31* 25*  --  '17 19 16 19  '$ CREATININE 1.00 0.90 1.41*   < > 1.26* 0.9 0.8 1.1  CALCIUM 9.3 8.9 8.2*  --  8.2* 7.8* 8.4* 9.0  MG  --   --  1.9  --  2.0  --   --   --   PHOS  --   --  3.3  --  3.5  --   --   --   TSH 2.758  --   --   --   --   --   --   --    < > = values in this interval not displayed.   Liver Function Tests: Recent Labs    05/04/22 0010 05/06/22 0618 05/07/22 0623 05/10/22 0000  AST 165* 75* 79* 28  ALT 40 33 32 19  ALKPHOS 53 52 54 56  BILITOT 1.0 0.6 0.9  --   PROT 5.4* 4.4* 4.7*  --   ALBUMIN 2.9* 2.3* 2.3* 2.4*   No results for input(s): "LIPASE", "  AMYLASE" in the last 8760 hours. Recent Labs    05/03/22 1616  AMMONIA 13   CBC: Recent Labs    05/04/22 0010 05/06/22 0618 05/07/22 0623 05/10/22 0000 05/18/22 0000  WBC 9.4 6.0 6.8 5.0 5.9  NEUTROABS 7.6 4.6 5.2 3,300.00 4,065.00  HGB 13.1 10.8* 11.3* 10.0* 10.4*  HCT 37.8* 31.2* 31.6* 30* 31*  MCV 93.8 94.0 93.5  --   --   PLT 153 124* 127* 132* 230   Lipid Panel: No results for input(s): "CHOL", "HDL", "LDLCALC", "TRIG", "CHOLHDL", "LDLDIRECT" in the last 8760 hours. No results found for: "HGBA1C"  Procedures since  last visit: No results found.  Assessment/Plan 1. Parkinson's disease ([place]) Now in Sinemet Follow up with Dr Tat Not sure if he is taking it and being complaint. AS he kept saying that he sometimes miss the dose and do not see the difference  2. Pulmonary nodules Follows with Pulmonary and per their notes nodules are stable and he doe snto need close follow up   3. Gastroesophageal reflux disease without esophagitis Per Pulmonary he needs to stay on PPI for his cough Increase to BID if cough gets Worse  4. Primary insomnia Melatonin   5. Slow transit constipation Takes Miralax  6. Bilateral hip pain Resolved Has Arthritis Seen by Ortho before  7. Arthritis of right knee Tylenol Prn Also Follow up with Ortho for possible injection  8. Weight loss He thinks it is due to stress of moving here Will continue to monitor  9. Hyperlipidemia, unspecified hyperlipidemia type On statin  10. Cognitive impairment Was repeating himself  Plan for Neuropsych eval per Dr Tat He is staying independent in his Apartment very high risk of r falls   11 Anemia Repeat Labs  Labs/tests ordered:  * No order type specified * Next appt:  08/26/2022

## 2022-08-24 ENCOUNTER — Telehealth: Payer: Self-pay | Admitting: Neurology

## 2022-08-24 ENCOUNTER — Ambulatory Visit (INDEPENDENT_AMBULATORY_CARE_PROVIDER_SITE_OTHER): Payer: Medicare Other | Admitting: Neurology

## 2022-08-24 DIAGNOSIS — G5601 Carpal tunnel syndrome, right upper limb: Secondary | ICD-10-CM

## 2022-08-24 DIAGNOSIS — M5417 Radiculopathy, lumbosacral region: Secondary | ICD-10-CM

## 2022-08-24 DIAGNOSIS — R253 Fasciculation: Secondary | ICD-10-CM

## 2022-08-24 NOTE — Telephone Encounter (Signed)
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  What happened to the MRI cervical spine we ordered?

## 2022-08-24 NOTE — Procedures (Signed)
[facility]  [place], [address]  [place], [place] [address] Tel: [phone] Fax:  [phone] Test Date:  08/24/2022  Patient: Johnny [name] DOB: February 18, 1941 Physician: Narda Amber, DO  Sex: Male Height: '5\' 10"'$  Ref Phys: Alonza Bogus, D.O.  ID#: [id number]   Technician:    Patient Complaints: This is a 81 year old man referred for evaluation of muscle fasciculations.  NCV & EMG Findings: Extensive electrodiagnostic testing of the right upper and lower extremity shows:  Right median sensory response shows prolonged latency (4.3 ms) and reduced amplitude (8.1 V).  Right ulnar, sural, and superficial peroneal sensory responses are within normal limits. Right median motor response shows prolonged latency (4.6 ms) and reduced amplitude (3.9 mV).  Right ulnar motor response shows slowed conduction velocity across the elbow (A Elbow-B Elbow, 33 m/s), with normal latency and amplitude.  Right tibial motor response shows reduced amplitude (1.0 mV).  Right peroneal motor responses within normal limits. Right tibial H reflex study is within normal limits.   In the right upper extremity, chronic motor axonal loss changes are seen affecting the abductor pollicis brevis, first dorsal interosseous, and flexor carpi ulnaris muscles. Right lower extremity, chronic motor axonal loss changes are seen affecting all the tested muscles involving the L3-S1 myotome.  There is no evidence of accompanying active denervation.   Impression: Right median neuropathy at or distal to the wrist, consistent with a clinical diagnosis of carpal tunnel syndrome.  Overall, these findings are severe in degree electrically. Right ulnar neuropathy with slowing across the elbow, demyelinating, mild. 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 In particular, there is no evidence of a widespread disorder of anterior horn cells or sensorimotor  polyneuropathy.   ___________________________ Narda Amber, DO    Nerve Conduction Studies Anti Sensory Summary Table   Stim Site NR Peak (ms) Norm Peak (ms) O-P Amp (V) Norm O-P Amp  Right Median Anti Sensory (2nd Digit)  33C  Wrist    4.3 <3.8 8.1 >10  Right Sup Peroneal Anti Sensory (Ant Lat Mall)  33C  12 cm    2.5 <4.6 3.3 >3  Right Sural Anti Sensory (Lat Mall)  33C  Calf    4.3 <4.6 4.1 >3  Right Ulnar Anti Sensory (5th Digit)  33C  Wrist    3.0 <3.2 14.7 >5   Motor Summary Table   Stim Site NR Onset (ms) Norm Onset (ms) O-P Amp (mV) Norm O-P Amp Site1 Site2 Delta-0 (ms) Dist (cm) Vel (m/s) Norm Vel (m/s)  Right Median Motor (Abd Poll Brev)  33C  Wrist    4.6 <4.0 3.9 >5 Elbow Wrist 6.0 35.0 58 >50  Elbow    10.6  3.7         Right Peroneal Motor (Ext Dig Brev)  33C  Ankle    3.9 <6.0 3.2 >2.5 B Fib Ankle 9.1 37.0 41 >40  B Fib    13.0  2.5  Poplt B Fib 1.8 8.0 44 >40  Poplt    14.8  2.4         Right Tibial Motor (Abd Hall Brev)  33C  Ankle    5.0 <6.0 1.0 >4 Knee Ankle 8.8 45.0 51 >40  Knee    13.8  0.8         Right Ulnar Motor (Abd Dig Minimi)  33C  Wrist    2.5 <3.1 7.8 >7 B Elbow Wrist 4.2 22.0 52 >50  B Elbow  6.7  7.1  A Elbow B Elbow 3.0 10.0 33 >50  A Elbow    9.7  6.8          H Reflex Studies   NR H-Lat (ms) Lat Norm (ms) L-R H-Lat (ms)  Right Tibial (Gastroc)  33C     33.74 <35    EMG   Side Muscle Ins Act Fibs Fasc Recrt Dur. Amp. Poly. Activation Comment  Right 1stDorInt Nml Nml Nml 2- 1+ 1+ 1+ Nml N/A  Right Abd Poll Brev Nml Nml Nml 3- 1+ 1+ 1+ Nml N/A  Right Ext Indicis Nml Nml Nml Nml Nml Nml Nml Nml N/A  Right PronatorTeres Nml Nml Nml Nml Nml Nml Nml Nml N/A  Right Biceps Nml Nml Nml Nml Nml Nml Nml Nml N/A  Right Triceps Nml Nml Nml Nml Nml Nml Nml Nml N/A  Right Deltoid Nml Nml Nml Nml Nml Nml Nml Nml N/A  Right FlexCarpiUln Nml Nml Nml 1- 1+ 1+ 1+ Nml N/A  Right AntTibialis Nml Nml Nml Nml Nml Nml Nml Nml N/A  Right  Gastroc Nml Nml Nml 1- 1+ 1+ 1+ Nml N/A  Right Flex Dig Long Nml Nml Nml 1- 1+ 1+ 1+ Nml N/A  Right RectFemoris Nml Nml Nml 1- 1+ 1+ 1+ Nml N/A  Right BicepsFemS Nml Nml Nml 1- 1+ 1+ 1+ Nml N/A  Right AdductorLong Nml Nml Nml 2- 1+ 1+ 1+ Nml N/A      Waveforms:

## 2022-08-25 NOTE — Telephone Encounter (Signed)
Called [facility] and they are going to call the patient and the patients son again for scheduling of this MRI

## 2022-08-25 NOTE — Telephone Encounter (Signed)
In the notes for Imaging they have put they called patient several times with no return call

## 2022-08-26 ENCOUNTER — Telehealth: Payer: Self-pay | Admitting: Neurology

## 2022-08-26 ENCOUNTER — Other Ambulatory Visit: Payer: Medicare Other

## 2022-08-26 DIAGNOSIS — G20 Parkinson's disease: Secondary | ICD-10-CM | POA: Diagnosis not present

## 2022-08-26 DIAGNOSIS — E785 Hyperlipidemia, unspecified: Secondary | ICD-10-CM | POA: Diagnosis not present

## 2022-08-26 DIAGNOSIS — R4189 Other symptoms and signs involving cognitive functions and awareness: Secondary | ICD-10-CM

## 2022-08-26 DIAGNOSIS — M25552 Pain in left hip: Secondary | ICD-10-CM | POA: Diagnosis not present

## 2022-08-26 DIAGNOSIS — M25551 Pain in right hip: Secondary | ICD-10-CM | POA: Diagnosis not present

## 2022-08-26 NOTE — Telephone Encounter (Signed)
Patient called for recent EMG results and is also wanting to know where he is going to have an MRI of his wrist.

## 2022-08-26 NOTE — Telephone Encounter (Signed)
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

## 2022-08-27 LAB — CBC WITH DIFFERENTIAL/PLATELET
Absolute Monocytes: 510 cells/uL (ref 200–950)
Basophils Absolute: 10 cells/uL (ref 0–200)
Basophils Relative: 0.2 %
Eosinophils Absolute: 291 cells/uL (ref 15–500)
Eosinophils Relative: 5.6 %
HCT: 33.4 % — ABNORMAL LOW (ref 38.5–50.0)
Hemoglobin: 11.6 g/dL — ABNORMAL LOW (ref 13.2–17.1)
Lymphs Abs: 1024 cells/uL (ref 850–3900)
MCH: 32.8 pg (ref 27.0–33.0)
MCHC: 34.7 g/dL (ref 32.0–36.0)
MCV: 94.4 fL (ref 80.0–100.0)
MPV: 9.1 fL (ref 7.5–12.5)
Monocytes Relative: 9.8 %
Neutro Abs: 3364 cells/uL (ref 1500–7800)
Neutrophils Relative %: 64.7 %
Platelets: 187 10*3/uL (ref 140–400)
RBC: 3.54 10*6/uL — ABNORMAL LOW (ref 4.20–5.80)
RDW: 11.5 % (ref 11.0–15.0)
Total Lymphocyte: 19.7 %
WBC: 5.2 10*3/uL (ref 3.8–10.8)

## 2022-08-27 LAB — COMPLETE METABOLIC PANEL WITH GFR
AG Ratio: 1.9 (calc) (ref 1.0–2.5)
ALT: 5 U/L — ABNORMAL LOW (ref 9–46)
AST: 15 U/L (ref 10–35)
Albumin: 4 g/dL (ref 3.6–5.1)
Alkaline phosphatase (APISO): 85 U/L (ref 35–144)
BUN: 23 mg/dL (ref 7–25)
CO2: 27 mmol/L (ref 20–32)
Calcium: 9.4 mg/dL (ref 8.6–10.3)
Chloride: 104 mmol/L (ref 98–110)
Creat: 0.94 mg/dL (ref 0.70–1.22)
Globulin: 2.1 g/dL (calc) (ref 1.9–3.7)
Glucose, Bld: 100 mg/dL — ABNORMAL HIGH (ref 65–99)
Potassium: 4.2 mmol/L (ref 3.5–5.3)
Sodium: 139 mmol/L (ref 135–146)
Total Bilirubin: 0.5 mg/dL (ref 0.2–1.2)
Total Protein: 6.1 g/dL (ref 6.1–8.1)
eGFR: 80 mL/min/{1.73_m2} (ref >=60)

## 2022-08-27 LAB — LIPID PANEL
Cholesterol: 144 mg/dL (ref <200)
HDL: 52 mg/dL (ref >=40)
LDL Cholesterol (Calc): 76 mg/dL (calc)
Non-HDL Cholesterol (Calc): 92 mg/dL (calc) (ref <130)
Total CHOL/HDL Ratio: 2.8 (calc) (ref <5.0)
Triglycerides: 75 mg/dL (ref <150)

## 2022-08-27 NOTE — Telephone Encounter (Signed)
Pt number is not working called pt son it went to voice mail left a message to call the office back when he calls back we will let him know that  he Johnny [name]) has severe CTS ( Carpal tunnel syndrome) on the R and some pinched nerves in the neck and that his MRI of the cervical spine was ordered to be done at [facility] ,

## 2022-08-30 NOTE — Telephone Encounter (Signed)
Pt number is not working called pt son it went to voice mail left a message to call the office back when he calls back we will let him know that  he Johnny [name]) has severe CTS ( Carpal tunnel syndrome) on the R and some pinched nerves in the neck and that his MRI of the cervical spine was ordered to be done at [facility] ,

## 2022-09-01 NOTE — Telephone Encounter (Signed)
Pt son Chrissie Noa called informed his Dad Johnny [name] has severe CTS ( Carpal tunnel syndrome) on the R and some pinched nerves in the neck and that his MRI of the cervical spine was ordered to be done at [facility]. Pt number was wrong in epic I have updated it so that it is correct,

## 2022-09-09 DIAGNOSIS — Z23 Encounter for immunization: Secondary | ICD-10-CM | POA: Diagnosis not present

## 2022-09-14 NOTE — Progress Notes (Signed)
Assessment/Plan:    Parkinson's disease, diagnosed June, 2023 but suspect that patient has had disease for quite some time prior to that -better with initiation of levodopa             -increase carbidopa/levodopa 25/100, 2/2/1.  Will need to watch cognition.   2. Sialorrhea             -This is commonly associated with PD.  We talked about treatments.  The patient is not a candidate for oral anticholinergic therapy because of increased risk of confusion and falls.  We can discuss botox in future if more problematic for patient.  He denies he even has it but clearly does on examination   3. Myoclonus/mini-myoclonus and fasiculations             -resolved after started levodopa  -tried to order MRI cervical spine but pt didn't respond when they tried to contact him and decided to hold today since things were better.             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  Pt asymptommatic in all those regards.   4. Memory change             -son previously asked about lbd.  I do not see evidence of this, but he certainly could have PDD.   Patient is scheduled for neurocognitive testing February 27.  -discussed no driving.  He has been driving a bit and told him I don't want him doing this.  Even if cognition is good, his reflexes are not.  Subjective:   Johnny [name] was seen today in follow up for Parkinsons disease, diagnosed last visit.  My previous records were reviewed prior to todays visit as well as outside records available to me.  No one accompanies the patient today.  Levodopa was started.  He is tolerating it well.  He reports that medication is working well.  pt denies falls.  Pt denies lightheadedness, near syncope.  No hallucinations.  Mood has been good.  Separately, patient was having some  myoclonic/mini myoclonus last visit and I told him that it could be from CKD, but I was not sure it was the sole etiology.  We subsequently ordered an MRI of the cervical spine and the imaging facility called him several times, but no phone call was returned.  I had my office called the patient's son and he did state that they would reschedule.  This has not been done.  EMG was completed and demonstrated chronic multilevel radiculopathies in the right L3-S1 nerve root segment, moderate.  There was also evidence of severe carpal tunnel on the right and mild ulnar neuropathy on the right.  He has no back pain and states that he has no hand paresthesias, despite test findings.  He has no falls.  He states that his balance is good.    Current prescribed movement disorder medications: Carbidopa/levodopa 25/100, 1 tablet 3 times per day (started last visit)    ALLERGIES:  No Known Allergies  CURRENT MEDICATIONS:  Current Meds  Medication Sig   ascorbic acid (VITAMIN C) 500 MG tablet Take 1 tablet (500 mg total) by mouth daily.   aspirin EC 81 MG tablet Take 1 tablet (81 mg total) by mouth daily. Swallow whole.   atorvastatin (LIPITOR) 20 MG tablet  Take 1 tablet (20 mg total) by mouth daily.   cetirizine (ZYRTEC) 10 MG tablet Take 10 mg by mouth daily.   Cholecalciferol (VITAMIN D3) 50 MCG (2000 UT) TABS Take 1 tablet by mouth daily.   dextromethorphan-guaiFENesin (MUCINEX DM) 30-600 MG per 12 hr tablet Take 1 tablet by mouth 2 (two) times daily.   folic acid (FOLVITE) 1 MG tablet Take 1 tablet (1 mg total) by mouth daily.   lactose free nutrition (BOOST PLUS) LIQD Take 1 Container by mouth in the morning and at bedtime.   melatonin 5 MG TABS Take 1 tablet (5 mg total) by mouth every evening. For insomnia   Multiple Vitamin (MULTIVITAMIN WITH MINERALS) TABS tablet Take 1 tablet by mouth daily.   omeprazole (PRILOSEC) 40 MG capsule Take 1 capsule (40 mg total) by mouth at bedtime.   polyethylene  glycol (MIRALAX / GLYCOLAX) 17 g packet Take 17 g by mouth daily.   zinc gluconate 50 MG tablet Take 50 mg by mouth daily.   [DISCONTINUED] carbidopa-levodopa (SINEMET IR) 25-100 MG tablet Take 1 tablet by mouth 3 (three) times daily.     Objective:   PHYSICAL EXAMINATION:    VITALS:   Vitals:   09/16/22 0840  BP: 116/73  Pulse: 74  SpO2: 98%  Weight: 156 lb 6.4 oz (70.9 kg)  Height: '5\' 10"'$  (1.778 m)    GEN:  The patient appears stated age and is in NAD. HEENT:  Normocephalic, atraumatic.  The mucous membranes are moist. The superficial temporal arteries are without ropiness or tenderness.   Neurological examination:  Orientation: The patient is alert and oriented x3. Cranial nerves: There is good facial symmetry with facial hypomimia. The speech is fluent and clear. Soft palate rises symmetrically and there is no tongue deviation. Hearing is intact to conversational tone. Sensation: Sensation is intact to light touch throughout Motor: Strength is at least antigravity x4.  Movement examination: Tone: There is mild to mod increased tone in the RUE/RLE.  Tone is normal on the left. Abnormal movements: no tremor today.  No axial myoclonus.  No mini myoclonus.   Coordination:  There is mid decremation with RAM's, in both upper extremities, but there was also apraxia here, limiting assessment. Gait and Station: The patient pushes off of the chair to arise.  Needs no assistance.  Has cane (last visit walker).  He is not nearly has short stepped and doesn't shuffle.  He has decreased arm swing.    I have reviewed and interpreted the following labs independently    Chemistry      Component Value Date/Time   NA 139 08/26/2022 0803   NA 139 05/18/2022 0000   K 4.2 08/26/2022 0803   CL 104 08/26/2022 0803   CO2 27 08/26/2022 0803   BUN 23 08/26/2022 0803   BUN 19 05/18/2022 0000   CREATININE 0.94 08/26/2022 0803   GLU 90 05/18/2022 0000      Component Value Date/Time    CALCIUM 9.4 08/26/2022 0803   ALKPHOS 56 05/10/2022 0000   AST 15 08/26/2022 0803   ALT 5 (L) 08/26/2022 0803   BILITOT 0.5 08/26/2022 0803       Lab Results  Component Value Date   WBC 5.2 08/26/2022   HGB 11.6 (L) 08/26/2022   HCT 33.4 (L) 08/26/2022   MCV 94.4 08/26/2022   PLT 187 08/26/2022    Lab Results  Component Value Date   TSH 2.758 05/03/2022     Total time  spent on today's visit was 25 minutes, including both face-to-face time and nonface-to-face time.  Time included that spent on review of records (prior notes available to me/labs/imaging if pertinent), discussing treatment and goals, answering patient's questions and coordinating care.  Cc:  Virgie Dad, MD

## 2022-09-16 ENCOUNTER — Ambulatory Visit (INDEPENDENT_AMBULATORY_CARE_PROVIDER_SITE_OTHER): Payer: Medicare Other | Admitting: Neurology

## 2022-09-16 ENCOUNTER — Encounter: Payer: Self-pay | Admitting: Neurology

## 2022-09-16 ENCOUNTER — Other Ambulatory Visit: Payer: Self-pay

## 2022-09-16 VITALS — BP 116/73 | HR 74 | Ht 70.0 in | Wt 156.4 lb

## 2022-09-16 DIAGNOSIS — G20A1 Parkinson's disease without dyskinesia, without mention of fluctuations: Secondary | ICD-10-CM | POA: Diagnosis not present

## 2022-09-16 DIAGNOSIS — R413 Other amnesia: Secondary | ICD-10-CM

## 2022-09-16 MED ORDER — CARBIDOPA-LEVODOPA 25-100 MG PO TABS: 1.0000 | ORAL_TABLET | Freq: Three times a day (TID) | ORAL | 1 refills | Status: DC

## 2022-09-16 MED ORDER — CARBIDOPA-LEVODOPA 25-100 MG PO TABS: ORAL_TABLET | ORAL | 1 refills | Status: DC

## 2022-09-16 NOTE — Patient Instructions (Addendum)
Take carbidopa/levodopa 25/100, 2 tablets at 7am, 2 at 11am, 1 at 4pm  Local and Online Resources for Power over Parkinson's Group  October 2023    LOCAL [place] PARKINSON'S GROUPS   [employer] Parkinson's Group:    [employer] Parkinson's Patient Education Group will be Wednesday, October 11th-*Hybrid meting*- in person at [place] location and via WEBEX at 2 pm.   Upcoming Power over Parkinson's Meetings:  2nd Wednesdays of the month at 2 pm:   October 11th, November 8th, December 13th  Contact Amy Marriott at [email or web address] if interested in participating in this group    LOCAL EVENTS AND NEW OFFERINGS  New PWR! Moves Community Fitness Instructor-Led Classes offering at [employer]!  TUESDAYS and Wednesdays 1-2 pm.   Contact Christy Weaver at  [email or web address] or Mike Sabin at [employer], [email or web address]  Dance for Parkinson 's classes will be on Tuesdays 9:30am-10:30am starting October 3-December 12 with a break the week of November 21st. Located in the [facility] [place] which is in the first floor of the [facility] ([address] To register:  [email or web address] or [phone]  Drumming for Parkinson's will be held on 2nd and 4th Mondays at 11:00 am.   Located at the [employer] ([address] [place].)  Contact Jane Maydian at [email or web address] or [phone]  Through support from the Parkinson's Foundation, the Dance and Drumming for Parkinson's classes are free for both patients and caregivers.    [employer] Parkinson's Tai Chi Class, Mondays at 11 am.  Call [phone] for details   ONLINE EDUCATION AND SUPPORT  Parkinson Foundation:  www.parkinson.org  PD Health at Home continues:  Mindfulness Mondays, Wellness Wednesdays, Fitness Fridays   Upcoming Education:    Parkinson's 101:  What you and your family should know.  Wednesday, Oct. 4th 1-2 pm   Expert Briefing:     Parkinson's and the Gut-Brain Connection.  Wednesday, Oct. 11th 1-2 pm  Hallucinations and Delusions in Parkinson's.  Wednesday, Nov. 8th, 1-2 pm  Register for expert briefings (webinars) at [email or web address]/resources-support/online-[email or web address]/expert-[email or web address]-[email or web address]  Please check out their website to sign up for emails and see their full online offerings      [employer] Foundation:  www.[email or web address]   Third Thursday Webinars:  On the third Thursday of every month at 12 p.m. ET, join our free live webinars to learn about various aspects of living with Parkinson's disease and our work to speed medical breakthroughs.  Upcoming Webinar:  Estate Planning Tips for Increasing your Financial Wellness. (Replay).  Thursday, Oct. 12th at 12 noon  Check out additional information on their website to see their full online offerings    [employer] Phinney Foundation:  www.davisphinneyfoundation.org  Upcoming Webinar:   Stay tuned  Webinar Series:  Living with Parkinson's Meetup.   Third Thursdays each month, 3 pm  Care Partner Monthly Meetup.  With Connie Carpenter Phinney.  First Tuesday of each month, 2 pm  Check out additional information to Live Well Today on their website    Parkinson and Movement Disorders (PMD) Alliance:  www.pmdalliance.org  [employer]:  Online Education Events  Sign up for emails, which are sent weekly to give you updates on programming and online offerings    Parkinson's Association of [place]:  www.parkinsonassociation.org  Information on online support groups, education events, and online exercises including Yoga, Parkinson's exercises and more-LOTS of information on links to PD resources and online events  Virtual Support Group through Parkinson's Association   of the [facility]; next one is scheduled for Wednesday, October 4th at 2 pm.  (These are typically scheduled for the 1st Wednesday of the month at 2 pm).  Visit website for  details.   MOVEMENT AND EXERCISE OPPORTUNITIES  PWR! Moves Classes at [place].  Wednesdays 10 and 11 am.   Contact Amy Marriott, PT [email or web address] if interested.  NEW PWR! Moves Class offerings at [employer].  *TUESDAYS* and Wednesdays 1-2 pm.  Contact Vonna Kotyk at  [email or web address] or Caron Presume at [place],  [email or web address]  Parkinson's Wellness Recovery (PWR! Moves)  www.[email or web address]  Info on the PWR! Virtual Experience:  You will have access to our expertise?through self-assessment, guided plans that start with the PD-specific fundamentals, educational content, tips, Q&A with an expert, and a growing Art therapist of PD-specific pre-recorded and live exercise classes of varying types and intensity - both physical and cognitive! If that is not enough, we offer 1:1 wellness consultations (in-person or virtual) to personalize your PWR! Research scientist (medical).   [employer] Fridays:   As part of the PD Health @ Home program, this free video series focuses each week on one aspect of fitness designed to support people living with Parkinson's.? These weekly videos highlight the [place] fitness guidelines for people with Parkinson's disease.  [email or web address]  Dance for PD website is offering free, live-stream classes throughout the week, as well as links to [employer] of classes:  [email or web address]  Virtual dance and Pilates for Parkinson's classes: Click on the Community Tab> Parkinson's Movement Initiative Tab.  To register for classes and for more information, visit [email or web address] and click the "community" tab.   [employer] Parkinson's Cycling Classes   [facility] [employer]:  Thursdays @ Noon-Live classes at [employer] ([employer] at [email or web address]?or [phone])  [facility]: Virtual Classes Mondays and Thursdays  Jeanette Caprice classes Tuesday, Wednesday and Thursday (contact Pitman at [email or web address] ?or [phone])  [place]  Varied levels of classes are offered Tuesdays and Thursdays at [employer].   Stretching with Verdis Frederickson weekly class is also offered for people with Parkinson's  To observe a class or for more information, call [phone] or email Hezzie Bump at [email or web address]   ADDITIONAL SUPPORT AND RESOURCES  [employer]:Online Caregiver Education Opportunities:  www.well-springsolutions.org/caregiver-education/caregiver-support-group.  You may also contact Vickki Muff at [email or web address] or [phone].     [employer] Navigator:  Just1Navigator program, a?free service to help individuals and families through the journey of determining care for older adults.  The "Navigator" is a Education officer, museum, Arnell Asal, who will speak with a prospective client and/or loved ones to provide an assessment of the situation and a set of recommendations for a personalized care plan -- all free of charge, and whether?[employer] offers the needed service or not. If the need is not a service we provide, we are well-connected with reputable programs in town that we can refer you to.  www.well-springsolutions.org or to speak with the Navigator, call [phone].

## 2022-11-11 ENCOUNTER — Encounter: Payer: Self-pay | Admitting: Psychology

## 2022-11-11 ENCOUNTER — Ambulatory Visit: Payer: Medicare Other

## 2022-11-11 ENCOUNTER — Ambulatory Visit (INDEPENDENT_AMBULATORY_CARE_PROVIDER_SITE_OTHER): Payer: Medicare Other | Admitting: Psychology

## 2022-11-11 DIAGNOSIS — F0670 Mild neurocognitive disorder due to known physiological condition without behavioral disturbance: Secondary | ICD-10-CM | POA: Diagnosis not present

## 2022-11-11 DIAGNOSIS — R4189 Other symptoms and signs involving cognitive functions and awareness: Secondary | ICD-10-CM

## 2022-11-11 DIAGNOSIS — G20A1 Parkinson's disease without dyskinesia, without mention of fluctuations: Secondary | ICD-10-CM | POA: Diagnosis not present

## 2022-11-11 HISTORY — DX: Mild neurocognitive disorder due to known physiological condition without behavioral disturbance: F06.70

## 2022-11-11 NOTE — Progress Notes (Signed)
   Psychometrician Note   Cognitive testing was administered to Johnny [name] by Cruzita Lederer, B.S. (psychometrist) under the supervision of Dr. Christia Reading, Ph.D., licensed psychologist on 11/11/2022. Mr. Mckowen did not appear overtly distressed by the testing session per behavioral observation or responses across self-report questionnaires. Rest breaks were offered.    The battery of tests administered was selected by Dr. Christia Reading, Ph.D. with consideration to Mr. Handy current level of functioning, the nature of his symptoms, emotional and behavioral responses during interview, level of literacy, observed level of motivation/effort, and the nature of the referral question. This battery was communicated to the psychometrist. Communication between Dr. Christia Reading, Ph.D. and the psychometrist was ongoing throughout the evaluation and Dr. Christia Reading, Ph.D. was immediately accessible at all times. Dr. Christia Reading, Ph.D. provided supervision to the psychometrist on the date of this service to the extent necessary to assure the quality of all services provided.    TYAIRE ODEM will return within approximately 1-2 weeks for an interactive feedback session with Dr. Melvyn Novas at which time his test performances, clinical impressions, and treatment recommendations will be reviewed in detail. Mr. Corp understands he can contact our office should he require our assistance before this time.  A total of 125 minutes of billable time were spent face-to-face with Mr. Corbello by the psychometrist. This includes both test administration and scoring time. Billing for these services is reflected in the clinical report generated by Dr. Christia Reading, Ph.D.  This note reflects time spent with the psychometrician and does not include test scores or any clinical interpretations made by Dr. Melvyn Novas. The full report will follow in a separate note.

## 2022-11-11 NOTE — Progress Notes (Addendum)
NEUROPSYCHOLOGICAL EVALUATION [place] Department of Neurology  Date of Evaluation: November 11, 2022  Reason for Referral:   Johnny [name] is a 81 y.o. right-handed Caucasian male referred by Alonza Bogus, D.O., to characterize his current cognitive functioning and assist with diagnostic clarity and treatment planning in the context of Parkinson's disease and suspected progressive cognitive decline.   Assessment and Plan:   Clinical Impression(s): Johnny [name]' pattern of performance is suggestive of profound impairment surrounding processing speed, cognitive flexibility, verbal fluency, and encoding (i.e, learning) aspects of memory. Relative weaknesses were exhibited across complex attention and clock drawing while variability was exhibited across delayed retrieval and consolidation aspects of memory. Performances were appropriate relative to age-matched peers across basic attention, receptive language, confrontation naming, and visuospatial abilities outside of clock drawing. Mr. Goettl largely denied difficulties completing instrumental activities of daily living (ADLs) independently. As he was unaccompanied to his current appointment, I was unable to obtain any corroborating information. As such, given evidence for cognitive dysfunction described above, he meets criteria for a Mild Neurocognitive Disorder ("mild cognitive impairment"). However, testing would suggest that he is towards the moderate to severe end of this spectrum and certainly at risk for transitioning to a dementia designation over the next few years.   The most likely etiology for ongoing cognitive impairment is his history of Parkinson's disease. Current testing patterns align well what what is commonly exhibited in this illness. As stated above, he may be at an increased risk for eventual transition to a Parkinson's disease dementia (PDD) presentation in the future. Continued medical  monitoring will be important moving forward.  His family has reportedly expressed concerns surrounding Lewy body disease to Dr. Carles Collet in the past. I do not see any compelling evidence for this illness being present across the current evaluation. He did not display prominent visuospatial impairment across testing, nor does he exhibit any of the expected behavioral symptoms (i.e., fully-formed visual hallucinations, REM sleep behaviors). As such, I agree with Dr. Carles Collet that the presence of this illness over idiopathic Parkinson's disease seems unlikely.  Recommendations: A repeat neuropsychological evaluation in 18-24 months (or sooner if functional decline is noted) is recommended to assess the trajectory of future cognitive decline should it occur. This will also aid in future efforts towards improved diagnostic clarity.  Given profound impairments surrounding processing speed and cognitive flexibility, I certainly agree with Dr. Doristine Devoid prior recommendation and would also advise Johnny [name] to fully abstain from all driving pursuits.  Should there be a progression of current deficits over time, he is unlikely to regain any independent living skills lost. Therefore, it is recommended that he remain as involved as possible in all aspects of household chores, finances, and medication management, with supervision to ensure adequate performance. He will likely benefit from the establishment and maintenance of a routine in order to maximize his functional abilities over time.  It will be important for Johnny [name] to have another person with him when in situations where he may need to process information, weigh the pros and cons of different options, and make decisions, in order to ensure that he fully understands and recalls all information to be considered.  Johnny [name] is encouraged to attend to lifestyle factors for brain health (e.g., regular physical exercise, good nutrition habits, regular participation  in cognitively-stimulating activities, and general stress management techniques), which are likely to have benefits for both emotional adjustment and cognition. Optimal control of vascular risk factors (including  safe cardiovascular exercise and adherence to dietary recommendations) is encouraged. Continued participation in activities which provide mental stimulation and social interaction is also recommended.   When learning new information, he would benefit from information being broken up into small, manageable pieces. He may also find it helpful to articulate the material in his own words and in a context to promote encoding at the onset of a new task. This material may need to be repeated multiple times to promote encoding.  Memory can be improved using internal strategies such as rehearsal, repetition, chunking, mnemonics, association, and imagery. External strategies such as written notes in a consistently used memory journal, visual and nonverbal auditory cues such as a calendar on the refrigerator or appointments with alarm, such as on a cell phone, can also help maximize recall.    To address problems with processing speed, he may wish to consider:   -Ensuring that he is alerted when essential material or instructions are being presented   -Adjusting the speed at which new information is presented   -Allowing for more time in comprehending, processing, and responding in conversation  To address problems with executive dysfunction, he may wish to consider:   -Avoiding external distractions when needing to concentrate   -Limiting exposure to fast paced environments with multiple sensory demands   -Writing down complicated information and using checklists   -Attempting and completing one task at a time (i.e., no multi-tasking)   -Verbalizing aloud each step of a task to maintain focus   -Taking frequent breaks during the completion of steps/tasks to avoid fatigue   -Reducing the amount of  information considered at one time  Review of Records:   Johnny [name] admitted to the ED on 05/03/2022 after experiencing a fall and found being down on the ground in his independent living facility by staff for an unknown period of time. He was admitted for acute rhabdomyolysis secondary to prolonged immobility, fall, and acute metabolic encephalopathy. His hospitalization was complicated by mild AKI. He improved with appropriate treatment and was ultimately discharged home on 05/07/2022.   Johnny [name] was seen by [facility] Wells Guiles Tat, D.O.) on 05/18/2022 for an evaluation of gait dysfunction. Tremors were reported for the past year or so and involve most body regions. Symptoms of hypophonia, occasional falls, and a bradykinetic gait were all observed. No hallucinations or REM sleep behaviors were noted. He acknowledged some short-term memory dysfunction. While some of this was attributed to his recent fall and hospitalization, he also stated that some dysfunction had been present prior to this. Ultimately, Dr. 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 Given concern for cognitive impairment, Johnny [name] was referred for a comprehensive neuropsychological evaluation to characterize his cognitive abilities and to assist with diagnostic clarity and treatment planning.   NCV/EMG on 08/24/2022 revealed right median neuropathy at or distal to the wrist, consistent with a clinical diagnosis of carpal tunnel syndrome. Findings were said to be severe in degree electrically. 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  He was seen by Dr. Carles Collet for follow-up on 09/16/2022. There was mention of some myoclonic movements and fasciculations in the interim. These were said to be improved with levodopa medications.  Regarding cognition, his son reportedly expressed concern surrounding Lewy body disease. Dr. Carles Collet did not strongly suspect this at that time.   Brain MRI on 02/21/2014 revealed  a remote subtle right frontal punctate focus of non-specific gliosis but was otherwise unremarkable. Head CT on 05/03/2022 in the context of his recent hospitalization was negative outside of right-sided scalp swelling.   Past Medical History:  Diagnosis Date   Abnormal CXR 02/01/2017   See cxr 12/27/16 with min atx L HD elevation with no comparison studies    Acute metabolic encephalopathy 94/70/9628   AKI (acute kidney injury) 05/06/2022   Allergic rhinitis 12/18/2013   Pt has a follow up with Dr Shaune Leeks. He was tested for [place] Pig allergy and it was normal.`[id number]`IMPRESSION: Wants to try [employer] after viewing ad for same.  History of inadequately controlled allergy symptoms.   Ambulatory dysfunction 05/07/2022   Aortic atherosclerosis 05/04/2022   Benign neoplasm of colon 03/07/2013   Had 2 polyps 5 years ago. Needs repeat colonoscopy   Bilateral impacted cerumen 05/29/2018   Bilateral inguinal hernia (BIH) 10/31/2013   s/p lap repair w mesh 10/15/2013    Constipation 05/04/2022   Decubitus ulcer of left foot, stage 1 05/03/2022   Delirium 05/06/2022   Dysphagia, pharyngoesophageal phase 02/14/2014   Dysphonia 05/14/2014   Fall at home 05/03/2022   Gastroesophageal reflux disease without esophagitis 01/07/2020   He has a known history of this but feels the symptoms he is experiencing at night with excess acid is a fairly recent problem. 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  Dr. Alinda Money, [facility] Formatting of this note might be different from the original. Overview: Overview: prostatectomy 2009 STORY: s/p bilateral nerve-sparing RLRP 01/01/2008.  Dr. 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  The goal is to have your total cholesterol < 200, the HDL (good cholesterol) >40, and the LDL (bad cholesterol) <100.  It is recomended that you follow a good low fat diet and exercise for 30 minutes 3-4 times a week.Formatting of this note might be different from the original. Overview: IMPRE   Old tear of lateral meniscus of right knee 01/05/2019   No significant pain, gives way though. No previous PT, Would like to see ortho for recommendations   Organic impotence    Pain in joint of right knee 01/20/2021   Pain in joint of right shoulder 05/11/2022   Pain of left hip joint 05/13/2022   Pain of right hip joint 05/13/2022   Paresthesia 09/04/2020   Parkinson's disease    Pulmonary nodules 05/04/2022   Seborrheic dermatitis 01/05/2019   Recent exacerbation, ketoconazole cream BID until resolution, then PRN.   SOB (shortness of breath) 11/06/2020   Soft tissue mass 06/09/2021   Traumatic hematoma of right shoulder 05/03/2022   Traumatic rhabdomyolysis 05/03/2022   Trigger ring finger of right hand 12/23/2014   Upper airway cough syndrome 02/01/2017   DgEs 01/29/14 ? Asp > neg ST eval  - Esophageal motility is within normal limits for age. No tertiary  contractions observed. Normal gastroesophageal junction. No  gastroesophageal reflux occurred or was elicited. Prompt emptying of  contrast in the duodenum.  Response to gabapentin documented by Dr Joya Gaskins / [facility] in 2016   - rechallenged with gabapentin 02/01/2017 > titrate  up to 300 tid   Vitamin D deficiency 03/07/2013   Appropiate labs done today. We will send the results and adjust treatment as needed.   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   Weakness of left lower extremity 05/07/2022   Wears glasses     Past Surgical History:  Procedure Laterality Date   BRAVO [place] STUDY N/A 05/06/2014   Procedure: BRAVO [place];  Surgeon: [name], MD;  Location: [facility];  Service: Endoscopy;  Laterality: N/A;   ESOPHAGOGASTRODUODENOSCOPY (EGD) WITH PROPOFOL N/A 05/06/2014   Procedure: ESOPHAGOGASTRODUODENOSCOPY (EGD) WITH PROPOFOL;  Surgeon: [name], MD;  Location: [facility];  Service: Endoscopy;  Laterality: N/A;   HYDROCELE EXCISION Left 1994   HYDROCELE EXCISION Right 10/15/2013   Procedure: HYDROCELECTOMY ADULT;  Surgeon: Ailene Rud, MD;  Location: [facility];  Service: Urology;  Laterality: Right;  with excision of multiple epidyidmal cyst   HYDROCELE EXCISION Left 10/14/2014   Procedure: EPIDIDYMAL CYST EXCISION, LEFT;  Surgeon: Ailene Rud, MD;  Location: [facility];  Service: Urology;  Laterality: Left;   INGUINAL HERNIA REPAIR Bilateral 10/15/2013   Procedure:  LAPAROSCOPIC EXPLORATION  with REPAIR OF BILATERAL INGUINAL HERNIA;  Surgeon: Adin Hector, MD;  Location: [place];  Service: General;  Laterality: Bilateral;   INSERTION OF MESH Bilateral 10/15/2013   Procedure: INSERTION OF MESH;  Surgeon: Adin Hector, MD;  Location: [place];  Service: General;  Laterality: Bilateral;   PILONIDAL CYST EXCISION  1970'S   ROBOT ASSISTED LAPAROSCOPIC RADICAL PROSTATECTOMY  01-01-2008  DR [place] Right 11-11-2005   TONSILLECTOMY     child   Current Outpatient Medications:    ascorbic acid (VITAMIN C) 500 MG tablet, Take 1 tablet (500 mg total) by mouth daily., Disp: 30 tablet, Rfl: 5   aspirin EC 81 MG tablet, Take 1 tablet (81 mg total) by mouth daily. Swallow whole., Disp: 30 tablet, Rfl: 5   atorvastatin (LIPITOR) 20 MG tablet, Take 1 tablet (20 mg total) by mouth daily., Disp: 30  tablet, Rfl: 5   carbidopa-levodopa (SINEMET IR) 25-100 MG tablet, 2 tablets at 7am, 2 at 11am, 1 at 4pm, Disp: 450 tablet, Rfl: 1   cetirizine (ZYRTEC) 10 MG tablet, Take 10 mg by mouth daily., Disp: , Rfl:    Cholecalciferol (VITAMIN D3) 50 MCG (2000 UT) TABS, Take 1 tablet by mouth daily., Disp: 30 tablet, Rfl: 5   dextromethorphan-guaiFENesin (MUCINEX DM) 30-600 MG per 12 hr tablet, Take 1 tablet by mouth 2 (two) times daily., Disp: , Rfl:    folic acid (FOLVITE) 1 MG tablet, Take 1 tablet (1 mg total) by mouth daily., Disp: 30 tablet, Rfl: 5   lactose free nutrition (BOOST PLUS) LIQD, Take 1 Container by mouth in the morning and at bedtime., Disp: , Rfl:    melatonin 5 MG TABS, Take 1 tablet (5 mg total) by mouth every evening. For insomnia, Disp: 30 tablet, Rfl: 5   Multiple Vitamin (MULTIVITAMIN WITH MINERALS) TABS tablet, Take 1 tablet by mouth daily., Disp: , Rfl:    omeprazole (PRILOSEC) 40 MG capsule, Take 1 capsule (40 mg total) by mouth at bedtime., Disp: 30 capsule, Rfl: 5   polyethylene glycol (MIRALAX / GLYCOLAX) 17 g packet, Take 17 g by mouth daily., Disp: 14 each, Rfl: 5   zinc gluconate 50 MG tablet, Take 50 mg by mouth daily., Disp: , Rfl:  No current  facility-administered medications for this visit.  Facility-Administered Medications Ordered in Other Visits:    bupivacaine (MARCAINE) 0.5 % (with pres) injection 50 mL, 50 mL, Infiltration, Once, Carolan Clines, MD  Clinical Interview:   The following information was obtained during a clinical interview with Mr. Strohecker prior to cognitive testing.  Cognitive Symptoms: Decreased short-term memory: Endorsed. When asked about memory, he noted that "it stinks." With direct questioning, he primarily reported trouble with recalling details of recent conversations and entering rooms and forgetting his original intention. During interview, he stated that memory dysfunction has been present for the past few months and does seem  to have gotten worse over time. Per medical records, memory dysfunction was acknowledged prior to his May 2023 fall and subsequent hospitalization.  Decreased long-term memory: Denied. Decreased attention/concentration: Denied. Reduced processing speed: Denied. Difficulties with executive functions: Denied. He also denied trouble with impulsivity and any significant personality changes.  Difficulties with emotion regulation: Denied. Difficulties with receptive language: Denied. Difficulties with word finding: Denied. Decreased visuoperceptual ability: Denied.  Difficulties completing ADLs: Largely denied. He reported no trouble with medication management and he likely benefits from only having two formally prescribed medications to manage. He described some level of independence with financial management and bill paying. However, he did acknowledge that his son will provide some assistance in this regard. Regarding driving, he reported that he has continued to drive but has been "trying to limit [his] driving." He was able to receive a ride to today's appointment. Per medical records, Dr. Carles Collet has explicitly recommended that he fully abstain from driving due to reduced reaction time and poor reflexes, as well as potential cognitive dysfunction.   Additional Medical History: History of traumatic brain injury/concussion: Denied. History of stroke: Denied. History of seizure activity: Denied. History of known exposure to toxins: Denied. Symptoms of chronic pain: Denied. However, medical records suggest numerous body regions where pain has been problematic in the past (see above).  Experience of frequent headaches/migraines: Endorsed. He reported daily, relatively mild headache symptoms which have been present since his fall and diagnosis of Parkinson's disease. He was unable to describe acute symptoms or where these symptoms generally present.  Frequent instances of dizziness/vertigo: Denied. He did  describe occasional onset of dizziness which seems random rather than isolated to when he stands quickly.   Sensory changes: He wears glasses with benefit. Other sensory changes/difficulties (e.g., hearing, taste, smell) were denied.  Balance/coordination difficulties: Endorsed. He described his balance as "not great." One side of the body was not said to be worse than the other. He denied any recent falls. However, he did experience a fall which led to a hospitalization in May 2023 as described above.  Other motor difficulties: Endorsed. He reported generally mild tremors which can affect his entire body. Per his report, symptoms seem to "come and go." He was unsure if current medications were helpful at mitigating tremors.   Sleep History: Estimated hours obtained each night: 8-9 hours.  Difficulties falling asleep: Denied. Difficulties staying asleep: Denied. Feels rested and refreshed upon awakening: Endorsed.  History of snoring: Unclear as he lives alone.  History of waking up gasping for air: Denied. Witnessed breath cessation while asleep: Denied.  History of vivid dreaming: Denied. Excessive movement while asleep: Denied. Instances of acting out his dreams: Denied.  Psychiatric/Behavioral Health History: Depression: He described his current mood as "okay" and denied to his knowledge any previous mental health concerns or diagnoses. Current or remote suicidal ideation, intent, or plan  was denied.  Anxiety: Denied. Mania: Denied. Trauma History: Denied. Visual/auditory hallucinations: Denied. Delusional thoughts: Denied.  Tobacco: Denied. Alcohol: He denied current alcohol consumption as well as a history of problematic alcohol abuse or dependence.  Recreational drugs: Denied.  Family History: Problem Relation Age of Onset   Bladder Cancer Son    Hypertension Mother    Colon cancer Neg Hx    Liver cancer Neg Hx    This information was confirmed by Mr.  [name].  Academic/Vocational History: Highest level of educational attainment: 18 years. 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 He described himself as an average (B/C) student in academic settings. No relative weaknesses were identified.  History of developmental delay: Denied. History of grade repetition: Denied. Enrollment in special education courses: Denied. History of LD/ADHD: Denied.  Employment: Retired. 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   Evaluation Results:   Behavioral Observations: Johnny [name] was unaccompanied, arrived to his appointment on time, and was appropriately dressed and groomed. He appeared alert. He ambulated slowly and had a somewhat shuffling gait. He utilized a cane for added support and no frank instability was observed. Mild tremors were observed in his hands, right worse than left, during interview. His affect was generally relaxed and positive. Spontaneous speech was fluent and word finding difficulties were not observed during the clinical interview. Thought processes were coherent, organized, and normal in content. Insight into his cognitive difficulties appeared poor as he generally indicated that performances across non-memory cognitive domains were "normal" and that "I'm doing fine" despite objective testing revealing notable impairments.   During testing, slowed processing speed and diminished reaction time was very apparent. Sustained attention was appropriate. Task engagement was adequate and he persisted when challenged. A mild tremor was observed but did not appear to impact testing performances. He fatigued as the evaluation progressed and it had to be abbreviated in response. Overall, Johnny [name] was cooperative with the clinical interview and subsequent testing procedures.   Adequacy of Effort: The validity of neuropsychological testing is  limited by the extent to which the individual being tested may be assumed to have exerted adequate effort during testing. Johnny [name] expressed his intention to perform to the best of his abilities and exhibited adequate task engagement and persistence. Scores across stand-alone and embedded performance validity measures were within expectation. As such, the results of the current evaluation are believed to be a valid representation of Johnny [name]' current cognitive functioning.  Test Results: Johnny [name] was largely oriented at the time of the current evaluation. He was three days off when stating the current date. He was also unable to state his current address; however, he did state that he had recently moved and correctly identified his independent living facility.   Intellectual abilities based upon educational and vocational attainment were estimated to be in the average to above average range. Premorbid abilities were estimated to be within the average range based upon a single-word reading test.   Processing speed was exceptionally low to well below average. Basic attention was average to above average. More complex attention (e.g., working memory) was well below average to below average. Visuomotor cognitive flexibility was well below average. Other aspects of executive functioning were unable to be assess due to increasing fatigue.  Receptive language abilities were unable to be assessed. However, Johnny [name] did not exhibit any difficulties comprehending task instructions and answered all questions asked  of him appropriately. Assessed expressive language was variable. Verbal fluency (both phonemic and semantic fluency) was well below average while confrontation naming was average to above average.      Assessed visuospatial/visuoconstructional abilities were variable but adequate outside of his drawing of a clock. However, points across his drawing were primarily lost due to him exhibiting  confusion and being unable to place the clock hands rather than frank visuospatial deficits.    Learning (i.e., encoding) of novel verbal information was exceptionally low. Spontaneous delayed recall (i.e., retrieval) of previously learned information was exceptionally low to below average. Retention rates were 67% (raw score of 2) across a story learning task, 33% (raw score of 1) across a list learning task, and 0% across a figure drawing task. Performance across recognition tasks was well below average to average, suggesting some limited evidence for information consolidation.   Results of emotional screening instruments suggested that recent symptoms of generalized anxiety were in the minimal range, while symptoms of depression were within normal limits. A screening instrument assessing recent sleep quality suggested the presence of minimal sleep dysfunction.  Tables of Scores:   Note: This summary of test scores accompanies the interpretive report and should not be considered in isolation without reference to the appropriate sections in the text. Descriptors are based on appropriate normative data and may be adjusted based on clinical judgment. Terms such as "Within Normal Limits" and "Outside Normal Limits" are used when a more specific description of the test score cannot be determined.       Percentile - Normative Descriptor > 98 - Exceptionally High 91-97 - Well Above Average 75-90 - Above Average 25-74 - Average 9-24 - Below Average 2-8 - Well Below Average < 2 - Exceptionally Low       Orientation:      Raw Score Percentile   NAB Orientation, Form 1 25/29 --- ---       Cognitive Screening:      Raw Score Percentile   SLUMS: 15/30 --- ---       RBANS, Form A: Standard Score/ Scaled Score Percentile   Total Score 75 5 Well Below Average  Immediate Memory 53 <1 Exceptionally Low    List Learning 2 <1 Exceptionally Low    Story Memory 3 1 Exceptionally Low   Visuospatial/Constructional 105 63 Average    Figure Copy 12 75 Above Average    Line Orientation 16/20 26-50 Average  Language 86 18 Below Average    Picture Naming 10/10 >75 Above Average    Semantic Fluency 4 2 Well Below Average  Attention 88 21 Below Average    Digit Span 12 75 Above Average    Coding 4 2 Well Below Average  Delayed Memory 68 2 Well Below Average    List Recall 1/10 10-16 Below Average    List Recognition 18/20 17-25 Below Average to Average    Story Recall 4 2 Well Below Average    Story Recognition 8/12 14-28 Below Average    Figure Recall 1 <1 Exceptionally Low    Figure Recognition 2/8 1-5 Well Below Average        Intellectual Functioning:      Standard Score Percentile   Test of Premorbid Functioning: 109 73 Average       Attention/Executive Function:     Trail Making Test (TMT): Raw Score (T Score) Percentile     Part A 82 secs.,  0 errors (23) <1 Exceptionally Low    Part B  230 secs.,  2 errors (32) 4 Well Below Average         Scaled Score Percentile   WAIS-IV Digit Span: 6 9 Below Average    Forward 9 37 Average    Backward 6 9 Below Average    Sequencing 5 5 Well Below Average       Language:     Verbal Fluency Test: Raw Score (Scaled Score) Percentile     Phonemic Fluency (CFL) 15 (5) 5 Well Below Average    Category Fluency 18 (4) 2 Well Below Average  *Based on [employer]'s Older Normative Studies (MOANS)          NAB Language Module, Form 1: T Score Percentile     Naming 29/31 (50) 50 Average       Visuospatial/Visuoconstruction:      Raw Score Percentile   Clock Drawing: 6/10 --- Impaired       NAB Spatial Module, Form 1: T Score Percentile     Visual Discrimination 39 14 Below Average        Scaled Score Percentile   WAIS-IV Block Design: 8 25 Average       Mood and Personality:      Raw Score Percentile   Geriatric Depression Scale: 5 --- Within Normal Limits  Geriatric Anxiety Scale: 10 --- Minimal    Somatic 5 --- Minimal     Cognitive 3 --- Mild    Affective 2 --- Minimal       Additional Questionnaires:      Raw Score Percentile   PROMIS Sleep Disturbance Questionnaire: 14 --- None to Slight   Informed Consent and Coding/Compliance:   The current evaluation represents a clinical evaluation for the purposes previously outlined by the referral source and is in no way reflective of a forensic evaluation.   Mr. Rutigliano was provided with a verbal description of the nature and purpose of the present neuropsychological evaluation. Also reviewed were the foreseeable risks and/or discomforts and benefits of the procedure, limits of confidentiality, and mandatory reporting requirements of this provider. The patient was given the opportunity to ask questions and receive answers about the evaluation. Oral consent to participate was provided by the patient.   This evaluation was conducted by Christia Reading, Ph.D., ABPP-CN, board certified clinical neuropsychologist. Johnny [name] completed a clinical interview with Dr. Melvyn Novas, billed as one unit [phone], and 125 minutes of cognitive testing and scoring, billed as one unit [phone] and three additional units [id number]. Psychometrist Cruzita Lederer, B.S., assisted Dr. Melvyn Novas with test administration and scoring procedures. As a separate and discrete service, Dr. Melvyn Novas spent a total of 160 minutes in interpretation and report writing billed as one unit [phone] and two units [id number].

## 2022-11-16 ENCOUNTER — Ambulatory Visit (INDEPENDENT_AMBULATORY_CARE_PROVIDER_SITE_OTHER): Payer: Medicare Other | Admitting: Psychology

## 2022-11-16 DIAGNOSIS — G20A1 Parkinson's disease without dyskinesia, without mention of fluctuations: Secondary | ICD-10-CM | POA: Diagnosis not present

## 2022-11-16 DIAGNOSIS — F0670 Mild neurocognitive disorder due to known physiological condition without behavioral disturbance: Secondary | ICD-10-CM

## 2022-11-16 NOTE — Progress Notes (Signed)
   Neuropsychology Feedback Session Johnny [name]. [facility] of Neurology  Reason for Referral:   Johnny [name] is a 81 y.o. right-handed Caucasian male referred by Alonza Bogus, D.O., to characterize his current cognitive functioning and assist with diagnostic clarity and treatment planning in the context of Parkinson's disease and suspected progressive cognitive decline.   Feedback:   Johnny [name] completed a comprehensive neuropsychological evaluation on 11/11/2022. Please refer to that encounter for the full report and recommendations. Briefly, results suggested profound impairment surrounding processing speed, cognitive flexibility, verbal fluency, and encoding (i.e, learning) aspects of memory. Relative weaknesses were exhibited across complex attention and clock drawing while variability was exhibited across delayed retrieval and consolidation aspects of memory. The most likely etiology for ongoing cognitive impairment is his history of Parkinson's disease. Current testing patterns align well what what is commonly exhibited in this illness. As stated above, he may be at an increased risk for eventual transition to a Parkinson's disease dementia (PDD) presentation in the future. Continued medical monitoring will be important moving forward. His family has reportedly expressed concerns surrounding Lewy body disease to Dr. Carles Collet in the past. I do not see any compelling evidence for this illness being present across the current evaluation. He did not display prominent visuospatial impairment across testing, nor does he exhibit any of the expected behavioral symptoms (i.e., fully-formed visual hallucinations, REM sleep behaviors). As such, I agree with Dr. Carles Collet that the presence of this illness over idiopathic Parkinson's disease seems unlikely.  Johnny [name] was unaccompanied during the current telephone call. He was within his residence while I was within my office. I  discussed the limitations of evaluation and management by telemedicine and the availability of in person appointments. Johnny [name] expressed his understanding and agreed to proceed. Content of the current session focused on the results of his neuropsychological evaluation. Johnny [name] was given the opportunity to ask questions and his questions were answered. He was encouraged to reach out should additional questions arise. A copy of his report was mailed at the conclusion of the visit.      17 minutes were spent preparing for, conducting, and documenting the current feedback session with Johnny [name], billed as one unit [phone].

## 2022-12-22 ENCOUNTER — Non-Acute Institutional Stay: Payer: Medicare Other | Admitting: Internal Medicine

## 2022-12-22 ENCOUNTER — Encounter: Payer: Self-pay | Admitting: Internal Medicine

## 2022-12-22 VITALS — BP 136/78 | HR 74 | Temp 97.8°F | Resp 18 | Ht 70.0 in | Wt 146.4 lb

## 2022-12-22 DIAGNOSIS — G20B2 Parkinson's disease with dyskinesia, with fluctuations: Secondary | ICD-10-CM | POA: Diagnosis not present

## 2022-12-22 DIAGNOSIS — M25552 Pain in left hip: Secondary | ICD-10-CM | POA: Diagnosis not present

## 2022-12-22 DIAGNOSIS — E785 Hyperlipidemia, unspecified: Secondary | ICD-10-CM | POA: Diagnosis not present

## 2022-12-22 DIAGNOSIS — R634 Abnormal weight loss: Secondary | ICD-10-CM | POA: Diagnosis not present

## 2022-12-22 DIAGNOSIS — F5101 Primary insomnia: Secondary | ICD-10-CM | POA: Diagnosis not present

## 2022-12-22 DIAGNOSIS — K219 Gastro-esophageal reflux disease without esophagitis: Secondary | ICD-10-CM | POA: Diagnosis not present

## 2022-12-22 DIAGNOSIS — M25551 Pain in right hip: Secondary | ICD-10-CM | POA: Diagnosis not present

## 2022-12-22 DIAGNOSIS — R918 Other nonspecific abnormal finding of lung field: Secondary | ICD-10-CM | POA: Diagnosis not present

## 2022-12-22 DIAGNOSIS — R4189 Other symptoms and signs involving cognitive functions and awareness: Secondary | ICD-10-CM

## 2022-12-22 NOTE — Progress Notes (Signed)
Location:  [facility] (12)  Provider:   Code Status:  Goals of Care:     12/22/2022    2:35 PM  Advanced Directives  Does Patient Have a Medical Advance Directive? Yes  Type of Paramedic of [place];Living will;Out of facility DNR (pink MOST or yellow form)  Does patient want to make changes to medical advance directive? No - Patient declined  Copy of [place] in Chart? No - copy requested     Chief Complaint  Patient presents with   Medical Management of Chronic Issues    4 month follow up. Patient states he would also like to discuss parkinson   Immunizations    Discussed the need for Tdap, shingles   Quality Metric Gaps    Discussed the need of awv    HPI: Patient is a 82 y.o. male seen today for medical management of chronic diseases.    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 He states that his son has taken over his finances  all his children out of state.   Past Medical History:  Diagnosis Date   Abnormal CXR 02/01/2017   See cxr 12/27/16 with min atx L HD elevation with no comparison studies    Acute metabolic encephalopathy 17/51/0258   AKI (acute kidney injury) 05/06/2022   Allergic rhinitis 12/18/2013   Pt has a follow up with  Dr Shaune Leeks. He was tested for [place] Pig allergy and it was normal.`E1o3L`IMPRESSION: Wants to try Gr[employer] after viewing ad for same.  History of inadequately controlled allergy symptoms.   Ambulatory dysfunction 05/07/2022   Aortic atherosclerosis 05/04/2022   Benign neoplasm of colon 03/07/2013   Had 2 polyps 5 years ago. Needs repeat colonoscopy   Bilateral impacted cerumen 05/29/2018   Bilateral inguinal hernia (BIH) 10/31/2013   s/p lap repair w mesh 10/15/2013    Constipation 05/04/2022   Decubitus ulcer of left foot, stage 1 05/03/2022   Delirium 05/06/2022   Dysphagia, pharyngoesophageal phase 02/14/2014   Dysphonia 05/14/2014   Fall at home 05/03/2022   Gastroesophageal reflux disease without esophagitis 01/07/2020   He has a known history of this but feels the symptoms he is experiencing at night with excess acid is a fairly recent problem. 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  Dr. Alinda Money, [facility] Formatting of this note might be different from the original. Overview: Overview:  prostatectomy 2009 STORY: s/p bilateral nerve-sparing RLRP 01/01/2008.  Dr. 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  The goal is to have your total cholesterol < 200, the HDL (good cholesterol) >40, and the LDL (bad cholesterol) <100.  It is recomended that you follow a good low fat diet and exercise for 30 minutes 3-4 times a week.Formatting of this note might be different from the original. Overview: IMPRE    Old tear of lateral meniscus of right knee 01/05/2019   No significant pain, gives way though. No previous PT, Would like to see ortho for recommendations   Organic impotence    Pain in joint of right knee 01/20/2021   Pain in joint of right shoulder 05/11/2022   Pain of left hip joint 05/13/2022   Pain of right hip joint 05/13/2022   Paresthesia 09/04/2020   Parkinson's disease    Pulmonary nodules 05/04/2022   Seborrheic dermatitis 01/05/2019   Recent exacerbation, ketoconazole cream BID until resolution, then PRN.   SOB (shortness of breath) 11/06/2020   Soft tissue mass 06/09/2021   Traumatic hematoma of right shoulder 05/03/2022   Traumatic rhabdomyolysis 05/03/2022   Trigger ring finger of right hand 12/23/2014   Upper airway cough syndrome 02/01/2017   DgEs 01/29/14 ? Asp > neg ST eval  - Esophageal motility is within normal limits for age. No tertiary  contractions observed. Normal gastroesophageal junction. No  gastroesophageal reflux occurred or was elicited. Prompt emptying of  contrast in the duodenum.  Response to gabapentin documented by Dr Joya Gaskins / [facility] in 2016   - rechallenged with gabapentin 02/01/2017 > titrate up to 300 tid   Vitamin D deficiency 03/07/2013   Appropiate labs done today. We will send the results and adjust treatment as needed.   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   Weakness of left lower extremity 05/07/2022   Wears glasses     Past Surgical History:  Procedure Laterality Date   BRAVO [place] STUDY N/A 05/06/2014   Procedure: BRAVO [place];  Surgeon: [name], MD;  Location: [facility];  Service: Endoscopy;  Laterality: N/A;   ESOPHAGOGASTRODUODENOSCOPY (EGD) WITH PROPOFOL N/A 05/06/2014   Procedure: ESOPHAGOGASTRODUODENOSCOPY (EGD) WITH PROPOFOL;  Surgeon: [name], MD;  Location: [facility];  Service: Endoscopy;  Laterality: N/A;   HYDROCELE EXCISION Left 1994   HYDROCELE EXCISION Right 10/15/2013   Procedure: HYDROCELECTOMY ADULT;   Surgeon: Ailene Rud, MD;  Location[facility];  Service: Urology;  Laterality: Right;  with excision of multiple epidyidmal cyst   HYDROCELE EXCISION Left 10/14/2014   Procedure: EPIDIDYMAL CYST EXCISION, LEFT;  Surgeon: Ailene Rud, MD;  Location: [facility];  Service: Urology;  Laterality: Left;   INGUINAL HERNIA REPAIR Bilateral 10/15/2013   Procedure:  LAPAROSCOPIC EXPLORATION  with REPAIR OF BILATERAL INGUINAL HERNIA;  Surgeon: Adin Hector, MD;  Location: [facility];  Service: General;  Laterality: Bilateral;   INSERTION OF MESH Bilateral 10/15/2013   Procedure: INSERTION OF MESH;  Surgeon: Adin Hector, MD;  Location: [facility];  Service: General;  Laterality: Bilateral;   PILONIDAL CYST EXCISION  1970'S   ROBOT ASSISTED LAPAROSCOPIC RADICAL PROSTATECTOMY  01-01-2008  DR [place] Right 11-11-2005   TONSILLECTOMY     child    No Known Allergies  Outpatient Encounter  Medications as of 12/22/2022  Medication Sig   ascorbic acid (VITAMIN C) 500 MG tablet Take 1 tablet (500 mg total) by mouth daily.   aspirin EC 81 MG tablet Take 1 tablet (81 mg total) by mouth daily. Swallow whole.   atorvastatin (LIPITOR) 20 MG tablet Take 1 tablet (20 mg total) by mouth daily.   carbidopa-levodopa (SINEMET IR) 25-100 MG tablet 2 tablets at 7am, 2 at 11am, 1 at 4pm   cetirizine (ZYRTEC) 10 MG tablet Take 10 mg by mouth daily.   Cholecalciferol (VITAMIN D3) 50 MCG (2000 UT) TABS Take 1 tablet by mouth daily.   dextromethorphan-guaiFENesin (MUCINEX DM) 30-600 MG per 12 hr tablet Take 1 tablet by mouth 2 (two) times daily.   folic acid (FOLVITE) 1 MG tablet Take 1 tablet (1 mg total) by mouth daily.   lactose free nutrition (BOOST PLUS) LIQD Take 1 Container by mouth in the morning and at bedtime.   melatonin 5 MG TABS Take 1 tablet (5 mg total) by mouth every evening. For insomnia   Multiple  Vitamin (MULTIVITAMIN WITH MINERALS) TABS tablet Take 1 tablet by mouth daily.   omeprazole (PRILOSEC) 40 MG capsule Take 1 capsule (40 mg total) by mouth at bedtime.   polyethylene glycol (MIRALAX / GLYCOLAX) 17 g packet Take 17 g by mouth daily.   zinc gluconate 50 MG tablet Take 50 mg by mouth daily.   Facility-Administered Encounter Medications as of 12/22/2022  Medication   bupivacaine (MARCAINE) 0.5 % (with pres) injection 50 mL    Review of Systems:  Review of Systems  Constitutional:  Positive for appetite change and unexpected weight change. Negative for activity change.  HENT: Negative.    Respiratory:  Negative for cough and shortness of breath.   Cardiovascular:  Negative for leg swelling.  Gastrointestinal:  Negative for constipation.  Genitourinary:  Negative for frequency.  Musculoskeletal:  Positive for gait problem. Negative for arthralgias and myalgias.  Skin: Negative.  Negative for rash.  Neurological:  Negative for dizziness and weakness.  Psychiatric/Behavioral:  Positive for confusion. Negative for sleep disturbance.   All other systems reviewed and are negative.   Health Maintenance  Topic Date Due   DTaP/Tdap/Td (1 - Tdap) Never done   Zoster Vaccines- Shingrix (1 of 2) Never done   Medicare Annual Wellness (AWV)  01/11/2023   COVID-19 Vaccine (8 - 2023-24 season) 01/07/2023 (Originally 11/04/2022)   Pneumonia Vaccine 43+ Years old  Completed   INFLUENZA VACCINE  Completed   HPV VACCINES  Aged Out    Physical Exam: Vitals:   12/22/22 1433  BP: 136/78  Pulse: 74  Resp: 18  Temp: 97.8 F (36.6 C)  TempSrc: Temporal  SpO2: 93%  Weight: 146 lb 6.4 oz (66.4 kg)  Height: '5\' 10"'$  (1.778 m)   Body mass index is 21.01 kg/m. Physical Exam Vitals reviewed.  Constitutional:      Appearance: Normal appearance.  HENT:     Head: Normocephalic.     Nose: Nose normal.     Mouth/Throat:     Mouth: Mucous membranes are moist.     Pharynx: Oropharynx is  clear.  Eyes:     Pupils: Pupils are equal, round, and reactive to light.  Cardiovascular:     Rate and Rhythm: Normal rate and regular rhythm.     Pulses: Normal pulses.     Heart sounds: No murmur heard. Pulmonary:     Effort: Pulmonary effort is normal. No respiratory distress.  Breath sounds: Normal breath sounds. No rales.  Abdominal:     General: Abdomen is flat. Bowel sounds are normal.     Palpations: Abdomen is soft.  Musculoskeletal:        General: No swelling.     Cervical back: Neck supple.  Skin:    General: Skin is warm.  Neurological:     Mental Status: He is alert and oriented to person, place, and time.  Psychiatric:        Mood and Affect: Mood normal.        Thought Content: Thought content normal.     Labs reviewed: Basic Metabolic Panel: Recent Labs    05/03/22 1616 05/04/22 0010 05/06/22 0618 05/06/22 1841 05/07/22 0623 05/10/22 0000 05/12/22 0000 05/18/22 0000 08/26/22 0803  NA 142   < > 140  --  141   < > 140 139 139  K 3.5   < > 3.2*  --  3.0*   < > 4.1 4.7 4.2  CL 108   < > 111  --  110   < > 105 103 104  CO2 28   < > 24  --  25   < > 26* 30* 27  GLUCOSE 111*   < > 110*  --  113*  --   --   --  100*  BUN 37*   < > 25*  --  17   < > '16 19 23  '$ CREATININE 1.00   < > 1.41*   < > 1.26*   < > 0.8 1.1 0.94  CALCIUM 9.3   < > 8.2*  --  8.2*   < > 8.4* 9.0 9.4  MG  --   --  1.9  --  2.0  --   --   --   --   PHOS  --   --  3.3  --  3.5  --   --   --   --   TSH 2.758  --   --   --   --   --   --   --   --    < > = values in this interval not displayed.   Liver Function Tests: Recent Labs    05/06/22 0618 05/07/22 0623 05/10/22 0000 08/26/22 0803  AST 75* 79* 28 15  ALT 33 32 19 5*  ALKPHOS 52 54 56  --   BILITOT 0.6 0.9  --  0.5  PROT 4.4* 4.7*  --  6.1  ALBUMIN 2.3* 2.3* 2.4*  --    No results for input(s): "LIPASE", "AMYLASE" in the last 8760 hours. Recent Labs    05/03/22 1616  AMMONIA 13   CBC: Recent Labs     05/06/22 0618 05/07/22 0623 05/10/22 0000 05/18/22 0000 08/26/22 0803  WBC 6.0 6.8 5.0 5.9 5.2  NEUTROABS 4.6 5.2 3,300.00 4,065.00 3,364  HGB 10.8* 11.3* 10.0* 10.4* 11.6*  HCT 31.2* 31.6* 30* 31* 33.4*  MCV 94.0 93.5  --   --  94.4  PLT 124* 127* 132* 230 187   Lipid Panel: Recent Labs    08/26/22 0803  CHOL 144  HDL 52  LDLCALC 76  TRIG 75  CHOLHDL 2.8   No results found for: "HGBA1C"  Procedures since last visit: No results found.  Assessment/Plan 1. Weight loss CT abdomen and Chest done in 05/23 Discussed about adding supplements as missing his Meals If Continue with weight loss will repeat CT 2. Pulmonary nodules  Follows with Pulmonary and per their notes nodules are stable and he Does not  need close follow up    3. Gastroesophageal reflux disease without esophagitis PPI Per Pulmonary he needs to stay on PPI for his cough Increase to BID if cough gets Worse   4. Parkinson's disease with dyskinesia and fluctuating manifestations On Sinemet And Seems to be doing well  5. 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 Hyperlipidemia, unspecified hyperlipidemia type On statin  7. 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 H/o Hematuria Has Appointment with Urology though he says he has not any repeat episodes and he is not sure if he wants to follow with them     Labs/tests ordered:  * No order type specified * Next appt:  Visit date not found

## 2023-02-01 ENCOUNTER — Encounter: Payer: Medicare Other | Admitting: Psychology

## 2023-02-10 ENCOUNTER — Encounter: Payer: Medicare Other | Admitting: Psychology

## 2023-03-10 ENCOUNTER — Other Ambulatory Visit: Payer: Medicare Other

## 2023-03-10 DIAGNOSIS — E785 Hyperlipidemia, unspecified: Secondary | ICD-10-CM

## 2023-03-10 DIAGNOSIS — R4189 Other symptoms and signs involving cognitive functions and awareness: Secondary | ICD-10-CM

## 2023-03-10 DIAGNOSIS — G20A1 Parkinson's disease without dyskinesia, without mention of fluctuations: Secondary | ICD-10-CM

## 2023-03-10 LAB — COMPLETE METABOLIC PANEL WITH GFR
AG Ratio: 2.3 (calc) (ref 1.0–2.5)
ALT: <3 U/L — ABNORMAL LOW (ref 9–46)
AST: 14 U/L (ref 10–35)
Albumin: 4.2 g/dL (ref 3.6–5.1)
Alkaline phosphatase (APISO): 83 U/L (ref 35–144)
BUN: 23 mg/dL (ref 7–25)
CO2: 31 mmol/L (ref 20–32)
Calcium: 9.3 mg/dL (ref 8.6–10.3)
Chloride: 103 mmol/L (ref 98–110)
Creat: 0.98 mg/dL (ref 0.70–1.22)
Globulin: 1.8 g/dL (calc) — ABNORMAL LOW (ref 1.9–3.7)
Glucose, Bld: 95 mg/dL (ref 65–139)
Potassium: 3.9 mmol/L (ref 3.5–5.3)
Sodium: 139 mmol/L (ref 135–146)
Total Bilirubin: 0.6 mg/dL (ref 0.2–1.2)
Total Protein: 6 g/dL — ABNORMAL LOW (ref 6.1–8.1)
eGFR: 77 mL/min/{1.73_m2} (ref >=60)

## 2023-03-16 ENCOUNTER — Encounter: Payer: Self-pay | Admitting: Internal Medicine

## 2023-03-16 ENCOUNTER — Non-Acute Institutional Stay: Payer: Medicare Other | Admitting: Internal Medicine

## 2023-03-16 VITALS — BP 142/90 | HR 79 | Temp 97.7°F | Resp 18 | Ht 70.0 in | Wt 154.0 lb

## 2023-03-16 DIAGNOSIS — K5901 Slow transit constipation: Secondary | ICD-10-CM | POA: Diagnosis not present

## 2023-03-16 DIAGNOSIS — R4189 Other symptoms and signs involving cognitive functions and awareness: Secondary | ICD-10-CM

## 2023-03-16 DIAGNOSIS — G20B2 Parkinson's disease with dyskinesia, with fluctuations: Secondary | ICD-10-CM | POA: Diagnosis not present

## 2023-03-16 DIAGNOSIS — E785 Hyperlipidemia, unspecified: Secondary | ICD-10-CM | POA: Diagnosis not present

## 2023-03-16 DIAGNOSIS — K219 Gastro-esophageal reflux disease without esophagitis: Secondary | ICD-10-CM

## 2023-03-16 DIAGNOSIS — F5101 Primary insomnia: Secondary | ICD-10-CM | POA: Diagnosis not present

## 2023-03-16 DIAGNOSIS — R634 Abnormal weight loss: Secondary | ICD-10-CM

## 2023-03-16 DIAGNOSIS — M25551 Pain in right hip: Secondary | ICD-10-CM | POA: Diagnosis not present

## 2023-03-16 DIAGNOSIS — M25552 Pain in left hip: Secondary | ICD-10-CM | POA: Diagnosis not present

## 2023-03-16 DIAGNOSIS — R918 Other nonspecific abnormal finding of lung field: Secondary | ICD-10-CM | POA: Diagnosis not present

## 2023-03-16 NOTE — Patient Instructions (Addendum)
Miralax OTC every other day Shingrix Shot in [facility] or [facility]

## 2023-03-16 NOTE — Progress Notes (Unsigned)
[facility]   Place of Service:  Clinic (12)  Provider:   Code Status: *** Goals of Care:     03/16/2023   10:38 AM  Advanced Directives  Does Johnny [name] Have a Medical Advance Directive? Yes  Type of Estate agentAdvance Directive Healthcare Power of Harvey CedarsAttorney;Living will;Out of facility DNR (pink MOST or yellow form)     Chief Complaint  Johnny [name] presents with   Medical Management of Chronic Issues    Johnny [name] is here for 3 month follow up with some labs    Immunizations    Johnny [name] is due for tetanus and shingles vaccine     HPI: Johnny [name] is a 82 y.o. male seen today for medical management of chronic diseases.   [facility] [place] [facility] Children are out of state  Diagnosed with Parkinson disease by Dr Tat for his tremors and Stiffness Using walker for ambulation He is also having cognitive issues   Neurocognitive testing per Dr Milbert CoulterMerz Mild And Most Likely PDD Says he is taking hsi sinement and doing wellwith it But continue sto have tremor and Rigidity in his extremeties Doe shave appoinmtent with Dr Arbutus Leasat  No Falls Doe snto drive long distance Weight loss and Poor Appetite  Appetite has improved some Weight has improved   Hematuria Did nto go fo follow up with Urology as just had one episode None recently   Past Medical History:  Diagnosis Date   Abnormal CXR 02/01/2017   See cxr 12/27/16 with min atx L HD elevation with no comparison studies    Acute metabolic encephalopathy 05/03/2022   AKI (acute kidney injury) 05/06/2022   Allergic rhinitis 12/18/2013   Pt has a follow up with Dr Beaulah DinningBardelas. He was tested for Israelgu[employer] Pig allergy and it was normal.`[id number]`IMPRESSION: Wants to try Gr[employer] after viewing ad for same.  History of inadequately controlled allergy symptoms.   Ambulatory dysfunction 05/07/2022   Aortic atherosclerosis 05/04/2022   Benign neoplasm of colon 03/07/2013   Had 2 polyps 5 years ago. Needs repeat colonoscopy   Bilateral impacted  cerumen 05/29/2018   Bilateral inguinal hernia (BIH) 10/31/2013   s/p lap repair w mesh 10/15/2013    Constipation 05/04/2022   Decubitus ulcer of left foot, stage 1 05/03/2022   Delirium 05/06/2022   Dysphagia, pharyngoesophageal phase 02/14/2014   Dysphonia 05/14/2014   Fall at home 05/03/2022   Gastroesophageal reflux disease without esophagitis 01/07/2020   He has a known history of this but feels the symptoms he is experiencing at night with excess acid is a fairly recent problem. 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  Dr. Laverle PatterBorden, [facility] Formatting of this note might be different from the original. Overview: Overview: prostatectomy 2009 STORY: s/p bilateral nerve-sparing RLRP 01/01/2008.  Dr. 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  The goal is to have your total cholesterol < 200, the HDL (good cholesterol) >40, and the LDL (bad cholesterol) <100.  It is recomended that you follow a good low fat diet and exercise for 30 minutes 3-4 times  a week.Formatting of this note might be different from the original. Overview: IMPRE   Old tear of lateral meniscus of right knee 01/05/2019   No significant pain, gives way though. No previous PT, Would like to see ortho for recommendations   Organic impotence    Pain in joint of right knee 01/20/2021   Pain in joint of right shoulder 05/11/2022   Pain of left hip joint 05/13/2022   Pain of right hip joint 05/13/2022   Paresthesia 09/04/2020    Parkinson's disease    Pulmonary nodules 05/04/2022   Seborrheic dermatitis 01/05/2019   Recent exacerbation, ketoconazole cream BID until resolution, then PRN.   SOB (shortness of breath) 11/06/2020   Soft tissue mass 06/09/2021   Traumatic hematoma of right shoulder 05/03/2022   Traumatic rhabdomyolysis 05/03/2022   Trigger ring finger of right hand 12/23/2014   Upper airway cough syndrome 02/01/2017   DgEs 01/29/14 ? Asp > neg ST eval  - Esophageal motility is within normal limits for age. No tertiary  contractions observed. Normal gastroesophageal junction. No  gastroesophageal reflux occurred or was elicited. Prompt emptying of  contrast in the duodenum.  Response to gabapentin documented by Dr Delford Field / [facility] in 2016   - rechallenged with gabapentin 02/01/2017 > titrate up to 300 tid   Vitamin D deficiency 03/07/2013   Appropiate labs done today. We will send the results and adjust treatment as needed.   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   Weakness of left lower extremity 05/07/2022   Wears glasses     Past Surgical History:  Procedure Laterality Date   BRAVO PH STUDY N/A 05/06/2014   Procedure: BRAVO PH STUDY;  Surgeon: Hart Carwin, MD;  Location: [facility];  Service: Endoscopy;  Laterality: N/A;   ESOPHAGOGASTRODUODENOSCOPY (EGD) WITH PROPOFOL N/A 05/06/2014   Procedure: ESOPHAGOGASTRODUODENOSCOPY (EGD) WITH PROPOFOL;  Surgeon: Hart Carwin, MD;  Location: [facility];  Service: Endoscopy;  Laterality: N/A;   HYDROCELE EXCISION Left 1994   HYDROCELE EXCISION Right 10/15/2013   Procedure: HYDROCELECTOMY ADULT;  Surgeon: Kathi Ludwig, MD;  Location[facility];  Service: Urology;  Laterality: Right;  with excision of multiple epidyidmal cyst   HYDROCELE EXCISION Left 10/14/2014   Procedure: EPIDIDYMAL CYST EXCISION, LEFT;  Surgeon: Kathi Ludwig, MD;  Location: [facility];  Service: Urology;  Laterality: Left;   INGUINAL HERNIA  REPAIR Bilateral 10/15/2013   Procedure:  LAPAROSCOPIC EXPLORATION  with REPAIR OF BILATERAL INGUINAL HERNIA;  Surgeon: Ardeth Sportsman, MD;  Location: [facility];  Service: General;  Laterality: Bilateral;   INSERTION OF MESH Bilateral 10/15/2013   Procedure: INSERTION OF MESH;  Surgeon: Ardeth Sportsman, MD;  Location: [facility];  Service: General;  Laterality: Bilateral;   PILONIDAL CYST EXCISION  1970'S   ROBOT ASSISTED LAPAROSCOPIC RADICAL PROSTATECTOMY  01-01-2008  DR Laverle Patter   TENOTOMY ACHILLES TENDON Right 11-11-2005   TONSILLECTOMY     child    No Known Allergies  Outpatient Encounter Medications as of 03/16/2023  Medication Sig   ascorbic acid (VITAMIN C) 500 MG tablet Take 1 tablet (500 mg total) by mouth daily.   aspirin EC 81 MG tablet Take 1 tablet (81 mg total) by mouth daily. Swallow whole.   atorvastatin (LIPITOR) 20 MG tablet Take 1 tablet (20 mg total) by mouth daily.   carbidopa-levodopa (SINEMET IR) 25-100 MG tablet 2 tablets at 7am, 2 at 11am, 1 at 4pm  Cholecalciferol (VITAMIN D3) 50 MCG (2000 UT) TABS Take 1 tablet by mouth daily.   melatonin 5 MG TABS Take 1 tablet (5 mg total) by mouth every evening. For insomnia   Multiple Vitamin (MULTIVITAMIN WITH MINERALS) TABS tablet Take 1 tablet by mouth daily.   omeprazole (PRILOSEC) 40 MG capsule Take 1 capsule (40 mg total) by mouth at bedtime.   polyethylene glycol (MIRALAX / GLYCOLAX) 17 g packet Take 17 g by mouth daily.   zinc gluconate 50 MG tablet Take 50 mg by mouth daily.   folic acid (FOLVITE) 1 MG tablet Take 1 tablet (1 mg total) by mouth daily.   lactose free nutrition (BOOST PLUS) LIQD Take 1 Container by mouth in the morning and at bedtime. (Johnny [name] not taking: Reported on 03/16/2023)   [DISCONTINUED] cetirizine (ZYRTEC) 10 MG tablet Take 10 mg by mouth daily. (Johnny [name] not taking: Reported on 03/16/2023)   [DISCONTINUED] dextromethorphan-guaiFENesin (MUCINEX DM) 30-600 MG per  12 hr tablet Take 1 tablet by mouth 2 (two) times daily. (Johnny [name] not taking: Reported on 03/16/2023)   Facility-Administered Encounter Medications as of 03/16/2023  Medication   bupivacaine (MARCAINE) 0.5 % (with pres) injection 50 mL    Review of Systems:  Review of Systems  Health Maintenance  Topic Date Due   DTaP/Tdap/Td (1 - Tdap) Never done   Zoster Vaccines- Shingrix (1 of 2) Never done   Medicare Annual Wellness (AWV)  01/11/2023   COVID-19 Vaccine (8 - 2023-24 season) 04/01/2023 (Originally 11/04/2022)   INFLUENZA VACCINE  07/07/2023   Pneumonia Vaccine 98+ Years old  Completed   HPV VACCINES  Aged Out    Physical Exam: Vitals:   03/16/23 1033  BP: (!) 142/90  Pulse: 79  Resp: 18  Temp: 97.7 F (36.5 C)  TempSrc: Temporal  SpO2: 96%  Weight: 154 lb (69.9 kg)  Height: 5\' 10"  (1.778 m)   Body mass index is 22.1 kg/m. Physical Exam  Labs reviewed: Basic Metabolic Panel: Recent Labs    05/03/22 1616 05/04/22 0010 05/06/22 0618 05/06/22 1841 05/07/22 2035 05/10/22 0000 05/18/22 0000 08/26/22 0803 03/10/23 0831  NA 142   < > 140  --  141   < > 139 139 139  K 3.5   < > 3.2*  --  3.0*   < > 4.7 4.2 3.9  CL 108   < > 111  --  110   < > 103 104 103  CO2 28   < > 24  --  25   < > 30* 27 31  GLUCOSE 111*   < > 110*  --  113*  --   --  100* 95  BUN 37*   < > 25*  --  17   < > 19 23 23   CREATININE 1.00   < > 1.41*   < > 1.26*   < > 1.1 0.94 0.98  CALCIUM 9.3   < > 8.2*  --  8.2*   < > 9.0 9.4 9.3  MG  --   --  1.9  --  2.0  --   --   --   --   PHOS  --   --  3.3  --  3.5  --   --   --   --   TSH 2.758  --   --   --   --   --   --   --   --    < > = values  in this interval not displayed.   Liver Function Tests: Recent Labs    05/06/22 0618 05/07/22 0623 05/10/22 0000 08/26/22 0803 03/10/23 0831  AST 75* 79* 28 15 14   ALT 33 32 19 5* <3*  ALKPHOS 52 54 56  --   --   BILITOT 0.6 0.9  --  0.5 0.6  PROT 4.4* 4.7*  --  6.1 6.0*  ALBUMIN 2.3* 2.3* 2.4*   --   --    No results for input(s): "LIPASE", "AMYLASE" in the last 8760 hours. Recent Labs    05/03/22 1616  AMMONIA 13   CBC: Recent Labs    05/06/22 0618 05/07/22 0623 05/10/22 0000 05/18/22 0000 08/26/22 0803  WBC 6.0 6.8 5.0 5.9 5.2  NEUTROABS 4.6 5.2 3,300.00 4,065.00 3,364  HGB 10.8* 11.3* 10.0* 10.4* 11.6*  HCT 31.2* 31.6* 30* 31* 33.4*  MCV 94.0 93.5  --   --  94.4  PLT 124* 127* 132* 230 187   Lipid Panel: Recent Labs    08/26/22 0803  CHOL 144  HDL 52  LDLCALC 76  TRIG 75  CHOLHDL 2.8   No results found for: "HGBA1C"  Procedures since last visit: No results found.  Assessment/Plan There are no diagnoses linked to this encounter.   Labs/tests ordered:  * No order type specified * Next appt:  04/04/2023

## 2023-03-20 NOTE — Progress Notes (Signed)
Assessment/Plan:    Parkinson's disease, diagnosed June, 2023 but suspect that patient has had disease for quite some time prior to that -increase carbidopa/levodopa 25/100, 2/2/2.  He is spreading this too far.  Will need to watch cognition.   2. Sialorrhea             -This is commonly associated with PD.  We talked about treatments.  The patient is not a candidate for oral anticholinergic therapy because of increased risk of confusion and falls.  We can discuss botox in future if more problematic for patient.  He denies he even has it but clearly does on examination   3. Myoclonus/mini-myoclonus and fasiculations             -resolved after started levodopa  -tried to order MRI cervical spine but pt didn't respond when they tried to contact him and decided to hold today since things were better.             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  Pt asymptommatic in all those regards.   4.  MCI             -Patient with neurocognitive testing in December, 2023. 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  -discussed no driving.  He has been driving a bit and told him I don't want him doing this.  Even if cognition is good, his reflexes are not.  Dr. Milbert Coulter also supported this recommendation in December, 2023.  5.  Dizziness  -[facility] if they can start tracking BP, both sitting and standing.  He plans to get a cuff  -increase water intake and can liberalize salt intake a bit  Subjective:   Johnny [name] was seen today in follow up for Parkinsons disease, diagnosed last visit.  My previous records were reviewed prior to todays visit as well as outside records available to me.  Patient had neurocognitive testing since last  visit.  This was done with Dr. Milbert Coulter in December, 2023.  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  We did increase his levodopa last visit.  He is tolerating it well, without side effects.  No hallucinations but he has lightheadedness when he gets out of bed.   He also c/o fatigue.  No near syncope.  Current prescribed movement disorder medications: Carbidopa/levodopa 25/100, 2/2/1 (he is taking 2 at 7am/2 at 1 pm/1 at bed)    ALLERGIES:  No Known Allergies  CURRENT MEDICATIONS:  Current Meds  Medication Sig   ascorbic acid (VITAMIN C) 500 MG tablet Take 1 tablet (500 mg total) by mouth daily.   aspirin EC 81 MG tablet Take 1 tablet (81 mg total) by mouth daily. Swallow whole.   atorvastatin (LIPITOR) 20 MG tablet Take 1 tablet (20 mg total) by mouth daily.   carbidopa-levodopa (SINEMET IR) 25-100 MG tablet 2 tablets at 7am, 2 at 11am, 1 at 4pm   Cholecalciferol (VITAMIN D3) 50 MCG (2000 UT) TABS Take 1 tablet by mouth daily.   folic acid (FOLVITE) 1 MG tablet Take 1 tablet (1 mg total) by mouth daily.   melatonin 5 MG TABS Take 1 tablet (5 mg total) by mouth every evening. For insomnia  Multiple Vitamin (MULTIVITAMIN WITH MINERALS) TABS tablet Take 1 tablet by mouth daily.   omeprazole (PRILOSEC) 40 MG capsule Take 1 capsule (40 mg total) by mouth at bedtime.   polyethylene glycol (MIRALAX / GLYCOLAX) 17 g packet Take 17 g by mouth daily. (Patient taking differently: Take 17 g by mouth daily as needed.)   zinc gluconate 50 MG tablet Take 50 mg by mouth daily.     Objective:   PHYSICAL EXAMINATION:    VITALS:   Vitals:   03/22/23 0837  BP: 116/78  Pulse: 73  SpO2: 98%  Weight: 155 lb 9.6 oz (70.6 kg)  Height: 5\' 9"  (1.753 m)     GEN:  The patient appears stated age and is in NAD. HEENT:  Normocephalic, atraumatic.  The mucous membranes are moist. The superficial temporal arteries are without ropiness or tenderness. CV:  RRR Lungs  CTAB   Neurological examination:  Orientation: The patient is alert and oriented x3. Cranial nerves: There is good facial symmetry with facial hypomimia. The speech is fluent and clear. Soft palate rises symmetrically and there is no tongue deviation. Hearing is intact to conversational tone. Sensation: Sensation is intact to light touch throughout Motor: Strength is at least antigravity x4.  Movement examination: Tone: There is mild to mod increased tone in the RUE/RLE.  Tone is mild increased on the lue Abnormal movements: no tremor today.  No axial myoclonus.  No mini myoclonus.   Coordination:  There is mid decremation with RAM's, with any form of RAMS, including alternating supination and pronation of the forearm, hand opening and closing, finger taps, heel taps and toe taps.   The toe taps on the L are slow Gait and Station: The patient pushes off of the chair to arise.  Needs no assistance.  Has no assistive device today.  He is not shuffling.  He is flexed at waist.  He is short stepped.    I have reviewed and interpreted the following labs independently    Chemistry      Component Value Date/Time   NA 139 03/10/2023 0831   NA 139 05/18/2022 0000   K 3.9 03/10/2023 0831   CL 103 03/10/2023 0831   CO2 31 03/10/2023 0831   BUN 23 03/10/2023 0831   BUN 19 05/18/2022 0000   CREATININE 0.98 03/10/2023 0831   GLU 90 05/18/2022 0000      Component Value Date/Time   CALCIUM 9.3 03/10/2023 0831   ALKPHOS 56 05/10/2022 0000   AST 14 03/10/2023 0831   ALT <3 (L) 03/10/2023 0831   BILITOT 0.6 03/10/2023 0831       Lab Results  Component Value Date   WBC 5.2 08/26/2022   HGB 11.6 (L) 08/26/2022   HCT 33.4 (L) 08/26/2022   MCV 94.4 08/26/2022   PLT 187 08/26/2022    Lab Results  Component Value Date   TSH 2.758 05/03/2022     Total time spent on today's visit was 32 minutes including both face-to-face time and nonface-to-face time.  Time included that spent on review  of records (prior notes available to me/labs/imaging if pertinent), discussing treatment and goals, answering patient's questions and coordinating care.  Cc:  Mahlon Gammon, MD

## 2023-03-22 ENCOUNTER — Ambulatory Visit (INDEPENDENT_AMBULATORY_CARE_PROVIDER_SITE_OTHER): Payer: Medicare Other | Admitting: Neurology

## 2023-03-22 ENCOUNTER — Encounter: Payer: Self-pay | Admitting: Neurology

## 2023-03-22 VITALS — BP 116/78 | HR 73 | Ht 69.0 in | Wt 155.6 lb

## 2023-03-22 DIAGNOSIS — R42 Dizziness and giddiness: Secondary | ICD-10-CM | POA: Diagnosis not present

## 2023-03-22 DIAGNOSIS — G20A1 Parkinson's disease without dyskinesia, without mention of fluctuations: Secondary | ICD-10-CM | POA: Diagnosis not present

## 2023-03-22 MED ORDER — CARBIDOPA-LEVODOPA 25-100 MG PO TABS
ORAL_TABLET | ORAL | 1 refills | Status: DC
Start: 2023-03-22 — End: 2023-09-21

## 2023-03-22 NOTE — Patient Instructions (Addendum)
Take carbidopa/levodopa 25/100, 2 at 7-8am/11am-12pm/3-4pm Increase water intake - take about 60 oz per day Monitor blood pressure in sitting and standing position about 2-3 days per week (you can ask them at friends home to help you with this or get a blood pressure cuff and take it yourself)  Local and Online Resources for Power over Parkinson's Group  April 2024   LOCAL [place] PARKINSON'S GROUPS   Power over Parkinson's Group:    Power Over Parkinson's Patient Education Group will be Wednesday, April 10th-*Hybrid meting*- in person at [facility] location and via [facility], 2:00-3:00 pm.   Power over [employer] and Care Partner Groups will meet together, with plans for separate break out session for caregivers, depending on topic/speaker Upcoming Power over Parkinson's Meetings/Care Partner Support:  2nd Wednesdays of the month at 2 pm:   April 10th, May 8th Contact Amy Marriott at [email or web address] if interested in participating in this group    LOCAL EVENTS AND NEW OFFERINGS  NEW:  Parkinson's Social Game Night.  First Thursday of each month, 2:00-4:00 pm.  *Next date is April 4th*.  Rossie Muskrat [employer], [employer].  Contact [email or web address] if interested. Parkinson's CarePartner Group for Men is in the works, if interested email [email or web address] ACT FITNESS Chair Yoga classes "Train and Gain", Fridays 10 am, [employer].  Contact Gina at [phone].  Health visitor Classes offering at [employer]!  TUESDAYS (Chair Yoga)  and Wednesdays (PWR! Moves)  1:00 pm.   Contact Synetta Shadow at  [email or web address]  or [phone]  Drumming for Parkinson's will be held on 2nd and 4th Mondays at 11:00 am.   Located at the [place] of the [employer] ([address]. [place]  Contact Albertina Parr at [email or web address] or [phone]  Dance for Parkinson  's classes will be on Tuesdays 10-11 am. Located in the [employer], in the first floor of the [employer] ([address] To register:  [email or web address] or [phone] [facility] Parkinson's Tai Chi Class, Mondays at 11 am.  Call [phone] for details Hamil-Kerr Challenge.  Bike, Run, [employer] for [employer].  Saturday, April 6th at [facility].  To register, visit [email or web address] Moving Day [facility].  Saturday, May 4th, 10 am start.  Register at [email or web address]    ONLINE EDUCATION AND SUPPORT  Parkinson Foundation:  www.parkinson.org  PD Health at Home continues:  Mindfulness Mondays, Wellness Wednesdays, Fitness Fridays  (PWR! Moves as part of Fitness Fridays March 22nd, 1-1:45 pm) Upcoming Education:   Parkinson's 101.  Wednesday, April 3rd, 1-2 pm Movement for Parkinson's.  Wednesday, May 1st, 1-2 pm Expert Briefing:  Research Update:  Working to [employer] PD.  Wednesday, April 10th, 1-2 pm Trouble with Zzz's:  Sleep Challenges with Parkinson's.  Wed, May 8th 1-2 pm Register for virtual education and expert briefings (webinars) at [email or web address].gl Please check out their website to sign up for emails and see their full online offerings     [employer] Foundation:  www.[email or web address]   Third Thursday Webinars:  On the third Thursday of every month at 12 p.m. ET, join our free live webinars to learn about various aspects of living with Parkinson's disease and our work to speed medical breakthroughs.  Upcoming Webinar:  Let's Talk Taboos:  Hard-to-Discuss Parkinson's Symptoms.  Thursday, April 18th at 12 noon. Check out additional information on their website to see their full online offerings    [employer]  Foundation:  www.[email or web address].[email or web address]  Upcoming Webinar:   [employer] [employer].  Wednesday, April 2nd, 4 pm Series:  Living with Parkinson's Meetup.    Third Thursdays each month, 3 pm  Care Partner Monthly Meetup.  With Jillene Bucks Phinney.  First Tuesday of each month, 2 pm  Check out additional information to Live Well Today on their website    Parkinson and Movement Disorders (PMD) Alliance:  www.pmdalliance.org  [employer] Online:  Online Education Events  Sign up for emails, which are sent weekly to give you updates on programming and online offerings    Parkinson's Association of [place]:  www.parkinsonassociation.org  Information on online support groups, education events, and online exercises including Yoga, Parkinson's exercises and more-LOTS of information on links to PD resources and online events  Virtual Support Group through Parkinson's Association of the [place]; next one is scheduled for Wednesday, April 3rd  MOVEMENT AND EXERCISE OPPORTUNITIES  PWR! Moves Classes at [facility] Exercise Room.  Wednesdays 10 and 11 am.   Contact Amy Marriott, PT [email or web address] if interested.  Parkinson's Exercise Class offerings at [employer]. *TUESDAYS* (Chair yoga) and Wednesdays (PWR! Moves)  1:00 pm.    Contact Synetta Shadow at [email or web address]    Parkinson's Wellness Recovery (PWR! Moves)  www.[email or web address]  Info on the PWR! Virtual Experience:  You will have access to our expertise?through self-assessment, guided plans that start with the PD-specific fundamentals, educational content, tips, Q&A with an expert, and a growing Engineering geologist of PD-specific pre-recorded and live exercise classes of varying types and intensity - both physical and cognitive! If that is not enough, we offer 1:1 wellness consultations (in-person or virtual) to personalize your PWR! Dance movement psychotherapist.   Parkinson [employer] Fridays:   As part of the PD Health @ Home program, this free video series focuses each week on one aspect of fitness designed to support people living with Parkinson's.? These weekly videos highlight  the Parkinson Foundation fitness guidelines for people with Parkinson's disease.  MenusLocal.com.[email or web address]  Dance for PD website is offering free, live-stream classes throughout the week, as well as links to [employer] of classes:  [email or web address]  Virtual dance and Pilates for Parkinson's classes: Click on the Community Tab> Parkinson's Movement Initiative Tab.  To register for classes and for more information, visit www.[email or web address].co.[email or web address] and click the "community" tab.   [employer] Parkinson's Cycling Classes   [facility]:  Thursdays @ Noon-Live classes at [employer] ([employer] at [email or web address]?or [phone])  [employer]: Classes Tuesday, Wednesday and Thursday (contact [place] at [email or web address] ?or [phone])  [facility] [employer]  Varied levels of classes are offered Tuesdays and Thursdays at [employer].   Stretching with Byrd Hesselbach weekly class is also offered for people with Parkinson's  To observe a class or for more information, call [phone] or email Patricia Nettle at [email or web address]   ADDITIONAL SUPPORT AND RESOURCES  [employer]:  Online Caregiver Education Opportunities:  www.well-springsolutions.org/caregiver-education/caregiver-support-group.  You may also contact Loleta Chance at [email or web address] or [phone].     [employer] April [employer] Decisions Day:  The Most Critical Legal and Medical Decisions to Consider Now!  Tuesday, April 16th, 1-3 pm at [facility] at [facility].  Contact Loleta Chance at [facility] -spring.org or [phone] Powerful Tools for Caregivers.  6 week educational series for caregivers.  April 18-May 23, 10:30 am-12:15 pm at [facility] 3rd Floor Conference Room.   Contact Loleta Chance at [facility] -spring.org or [phone]  to register Well-Spring Navigator:  Just1Navigator  program, a?free service to help individuals and families through the journey of determining care for older adults.  The "Navigator" is a Child psychotherapist, Sidney Ace, who will speak with a prospective client and/or loved ones to provide an assessment of the situation and a set of recommendations for a personalized care plan -- all free of charge, and whether?Well[employer]Spring [employer] offers the needed service or not. If the need is not a service we provide, we are well-connected with reputable programs in town that we can refer you to.  www.well-springsolutions.org or to speak with the Navigator, call [phone].

## 2023-04-04 ENCOUNTER — Encounter: Payer: Medicare Other | Admitting: Orthopedic Surgery

## 2023-04-04 DIAGNOSIS — Z0000 Encounter for general adult medical examination without abnormal findings: Secondary | ICD-10-CM

## 2023-04-05 NOTE — Progress Notes (Signed)
This encounter was created in error - please disregard.

## 2023-06-22 ENCOUNTER — Other Ambulatory Visit: Payer: Self-pay | Admitting: Internal Medicine

## 2023-07-20 ENCOUNTER — Non-Acute Institutional Stay: Payer: Medicare Other | Admitting: Internal Medicine

## 2023-07-20 ENCOUNTER — Encounter: Payer: Self-pay | Admitting: Internal Medicine

## 2023-07-20 VITALS — BP 114/68 | HR 80 | Temp 97.6°F | Resp 17 | Ht 69.0 in | Wt 145.6 lb

## 2023-07-20 DIAGNOSIS — F5101 Primary insomnia: Secondary | ICD-10-CM

## 2023-07-20 DIAGNOSIS — K219 Gastro-esophageal reflux disease without esophagitis: Secondary | ICD-10-CM

## 2023-07-20 DIAGNOSIS — R42 Dizziness and giddiness: Secondary | ICD-10-CM

## 2023-07-20 DIAGNOSIS — R634 Abnormal weight loss: Secondary | ICD-10-CM

## 2023-07-20 DIAGNOSIS — R918 Other nonspecific abnormal finding of lung field: Secondary | ICD-10-CM | POA: Diagnosis not present

## 2023-07-20 DIAGNOSIS — E785 Hyperlipidemia, unspecified: Secondary | ICD-10-CM | POA: Diagnosis not present

## 2023-07-20 DIAGNOSIS — G20B2 Parkinson's disease with dyskinesia, with fluctuations: Secondary | ICD-10-CM | POA: Diagnosis not present

## 2023-07-20 NOTE — Progress Notes (Signed)
Location:  [facility]ical scientist of Service:  Clinic (12)  Provider:   Code Status: DNR Goals of Care:     07/20/2023   10:25 AM  Advanced Directives  Does Patient Have a Medical Advance Directive? Yes  Type of Estate agent of [place];Living will  Does patient want to make changes to medical advance directive? No - Patient declined  Copy of Healthcare Power of Attorney in Chart? No - copy requested     Chief Complaint  Patient presents with   Medical Management of Chronic Issues    Patient states he is here for a 4 month follow up and have no other questions or concerns    Immunizations    Patient is due for covid vaccine, tdap and flu vaccine    HPI: Patient is a 82 y.o. male seen today for medical management of chronic diseases.    Weight loss Has lost weight again from last visit Continues to have poor Appetite Misses Lunch most of the time Denies any pain or SOB   Dizziness Continues to be the issue When he gets up from sitting position Using Walker Not checked his BP yet as suggested by Dr Tat  [place] disease Sinemet increased Doing well Does see some improvement  Cognitive impairment Still driving short distance Dr Tat has told him not drive at all Neurocognitive testing per Dr Milbert Coulter  Has high risk for  PDD profound impairment surrounding processing speed, cognitive flexibility, verbal fluency, and encoding (i.e, learning) aspects of memory    Past Medical History:  Diagnosis Date   Abnormal CXR 02/01/2017   See cxr 12/27/16 with min atx L HD elevation with no comparison studies    Acute metabolic encephalopathy 05/03/2022   AKI (acute kidney injury) 05/06/2022   Allergic rhinitis 12/18/2013   Pt has a follow up with Dr Beaulah Dinning. He was tested for Israel Pig allergy and it was normal.`E1o3L`IMPRESSION: Wants to try Grastek after viewing ad for same.  History of inadequately controlled allergy symptoms.   Ambulatory  dysfunction 05/07/2022   Aortic atherosclerosis 05/04/2022   Benign neoplasm of colon 03/07/2013   Had 2 polyps 5 years ago. Needs repeat colonoscopy   Bilateral impacted cerumen 05/29/2018   Bilateral inguinal hernia (BIH) 10/31/2013   s/p lap repair w mesh 10/15/2013    Constipation 05/04/2022   Decubitus ulcer of left foot, stage 1 05/03/2022   Delirium 05/06/2022   Dysphagia, pharyngoesophageal phase 02/14/2014   Dysphonia 05/14/2014   Fall at home 05/03/2022   Gastroesophageal reflux disease without esophagitis 01/07/2020   He has a known history of this but feels the symptoms he is experiencing at night with excess acid is a fairly recent problem. 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  Dr. Laverle Patter, [facility] Formatting of this note might be different from the original. Overview: Overview: prostatectomy 2009 STORY: s/p bilateral nerve-sparing RLRP 01/01/2008.  Dr. 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  The goal is to have your total cholesterol < 200, the HDL (good cholesterol) >40,  and the LDL (bad cholesterol) <100.  It is recomended that you follow a good low fat diet and exercise for 30 minutes 3-4 times a week.Formatting of this note might be different from the original. Overview: IMPRE   Old tear of lateral meniscus of right knee 01/05/2019   No significant pain, gives way though. No previous PT, Would like to see ortho for recommendations   Organic impotence    Pain in joint of  right knee 01/20/2021   Pain in joint of right shoulder 05/11/2022   Pain of left hip joint 05/13/2022   Pain of right hip joint 05/13/2022   Paresthesia 09/04/2020   Parkinson's disease    Pulmonary nodules 05/04/2022   Seborrheic dermatitis 01/05/2019   Recent exacerbation, ketoconazole cream BID until resolution, then PRN.   SOB (shortness of breath) 11/06/2020   Soft tissue mass 06/09/2021   Traumatic hematoma of right shoulder 05/03/2022   Traumatic rhabdomyolysis 05/03/2022   Trigger ring finger of right hand 12/23/2014   Upper airway cough syndrome 02/01/2017   DgEs 01/29/14 ? Asp > neg ST eval  - Esophageal motility is within normal limits for age. No tertiary  contractions observed. Normal gastroesophageal junction. No  gastroesophageal reflux occurred or was elicited. Prompt emptying of  contrast in the duodenum.  Response to gabapentin documented by Dr Delford Field / [facility] in 2016   - rechallenged with gabapentin 02/01/2017 > titrate up to 300 tid   Vitamin D deficiency 03/07/2013   Appropiate labs done today. We will send the results and adjust treatment as needed.   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   Weakness of left lower extremity 05/07/2022   Wears glasses     Past Surgical History:  Procedure Laterality Date   BRAVO PH STUDY N/A 05/06/2014   Procedure: BRAVO PH STUDY;  Surgeon: Hart Carwin, MD;  Location: [facility];  Service: Endoscopy;  Laterality: N/A;   ESOPHAGOGASTRODUODENOSCOPY (EGD) WITH PROPOFOL N/A 05/06/2014   Procedure: ESOPHAGOGASTRODUODENOSCOPY (EGD) WITH PROPOFOL;  Surgeon: Hart Carwin, MD;  Location: [facility];  Service: Endoscopy;  Laterality: N/A;   HYDROCELE EXCISION Left 1994   HYDROCELE EXCISION Right 10/15/2013   Procedure: HYDROCELECTOMY ADULT;  Surgeon: Kathi Ludwig, MD;  [facility];  Service: Urology;  Laterality: Right;  with excision of multiple epidyidmal cyst   HYDROCELE EXCISION Left 10/14/2014    Procedure: EPIDIDYMAL CYST EXCISION, LEFT;  Surgeon: Kathi Ludwig, MD;  [facility];  Service: Urology;  Laterality: Left;   INGUINAL HERNIA REPAIR Bilateral 10/15/2013   Procedure:  LAPAROSCOPIC EXPLORATION  with REPAIR OF BILATERAL INGUINAL HERNIA;  Surgeon: Ardeth Sportsman, MD;  Location: [facility];  Service: General;  Laterality: Bilateral;   INSERTION OF MESH Bilateral 10/15/2013   Procedure: INSERTION OF MESH;  Surgeon: Ardeth Sportsman, MD;  Location:  SURGERY CENTER;  Service: General;  Laterality: Bilateral;   PILONIDAL CYST EXCISION  1970'S   ROBOT ASSISTED LAPAROSCOPIC RADICAL PROSTATECTOMY  01-01-2008  DR Laverle Patter   TENOTOMY ACHILLES TENDON Right 11-11-2005   TONSILLECTOMY     child    No Known Allergies  Outpatient Encounter Medications as of 07/20/2023  Medication Sig   ascorbic acid (VITAMIN C) 500 MG tablet Take 1 tablet (500 mg total) by mouth daily.   aspirin EC 81 MG tablet Take 1 tablet (81 mg total) by mouth daily. Swallow whole.   atorvastatin (LIPITOR) 20 MG tablet TAKE 1 TABLET  BY MOUTH ONCE DAILY *BOTTLE*   carbidopa-levodopa (SINEMET IR) 25-100 MG tablet 2 at 7-8am/11am-12pm/3-4pm   Cholecalciferol (VITAMIN D3) 50 MCG (2000 UT) TABS Take 1 tablet by mouth daily.   folic acid (FOLVITE) 1 MG tablet Take 1 tablet (1 mg total) by mouth daily.   melatonin 5 MG TABS Take 1 tablet (5 mg total) by mouth every evening. For insomnia   Multiple Vitamin (MULTIVITAMIN WITH MINERALS) TABS tablet Take 1 tablet by mouth daily.   omeprazole (PRILOSEC) 40 MG capsule TAKE 1 CAPSULE BY MOUTH EVERY NIGHT AT BEDTIME *BOTTLE*   polyethylene glycol (MIRALAX / GLYCOLAX) 17 g packet Take 17 g by mouth daily as needed.   zinc gluconate 50 MG tablet Take 50 mg by mouth daily.   [DISCONTINUED] polyethylene glycol (MIRALAX / GLYCOLAX) 17 g packet Take 17 g by mouth daily. (Patient taking differently: Take 17 g by mouth daily as needed.)    Facility-Administered Encounter Medications as of 07/20/2023  Medication   bupivacaine (MARCAINE) 0.5 % (with pres) injection 50 mL    Review of Systems:  Review of Systems  Constitutional:  Positive for appetite change and unexpected weight change. Negative for activity change.  HENT: Negative.    Respiratory:  Negative for cough and shortness of breath.   Cardiovascular:  Negative for leg swelling.  Gastrointestinal:  Negative for constipation.  Genitourinary:  Negative for frequency.  Musculoskeletal:  Positive for gait problem. Negative for arthralgias and myalgias.  Skin: Negative.  Negative for rash.  Neurological:  Positive for dizziness. Negative for weakness.  Psychiatric/Behavioral:  Positive for confusion. Negative for sleep disturbance.   All other systems reviewed and are negative.   Health Maintenance  Topic Date Due   DTaP/Tdap/Td (1 - Tdap) Never done   Zoster Vaccines- Shingrix (1 of 2) Never done   COVID-19 Vaccine (6 - 2023-24 season) 11/04/2022   INFLUENZA VACCINE  07/07/2023   Medicare Annual Wellness (AWV)  04/03/2024   Pneumonia Vaccine 54+ Years old  Completed   HPV VACCINES  Aged Out    Physical Exam: Vitals:   07/20/23 1022  BP: 114/68  Pulse: 80  Resp: 17  Temp: 97.6 F (36.4 C)  TempSrc: Temporal  SpO2: 95%  Weight: 145 lb 9.6 oz (66 kg)  Height: 5\' 9"  (1.753 m)   Body mass index is 21.5 kg/m. Physical Exam Vitals reviewed.  Constitutional:      Appearance: Normal appearance.  HENT:     Head: Normocephalic.     Nose: Nose normal.     Mouth/Throat:     Mouth: Mucous membranes are moist.     Pharynx: Oropharynx is clear.  Eyes:     Pupils: Pupils are equal, round, and reactive to light.  Cardiovascular:     Rate and Rhythm: Normal rate and regular rhythm.     Pulses: Normal pulses.     Heart sounds: No murmur heard. Pulmonary:     Effort: Pulmonary effort is normal. No respiratory distress.     Breath sounds: Normal breath  sounds. No rales.  Abdominal:     General: Abdomen is flat. Bowel sounds are normal.     Palpations: Abdomen is soft.  Musculoskeletal:        General: No swelling.     Cervical back: Neck supple.  Skin:    General: Skin is warm.  Neurological:     General: No focal deficit present.     Mental Status: He is alert and oriented to  person, place, and time.  Psychiatric:        Mood and Affect: Mood normal.        Thought Content: Thought content normal.     Labs reviewed: Basic Metabolic Panel: Recent Labs    08/26/22 0803 03/10/23 0831  NA 139 139  K 4.2 3.9  CL 104 103  CO2 27 31  GLUCOSE 100* 95  BUN 23 23  CREATININE 0.94 0.98  CALCIUM 9.4 9.3   Liver Function Tests: Recent Labs    08/26/22 0803 03/10/23 0831  AST 15 14  ALT 5* <3*  BILITOT 0.5 0.6  PROT 6.1 6.0*   No results for input(s): "LIPASE", "AMYLASE" in the last 8760 hours. No results for input(s): "AMMONIA" in the last 8760 hours. CBC: Recent Labs    08/26/22 0803  WBC 5.2  NEUTROABS 3,364  HGB 11.6*  HCT 33.4*  MCV 94.4  PLT 187   Lipid Panel: Recent Labs    08/26/22 0803  CHOL 144  HDL 52  LDLCALC 76  TRIG 75  CHOLHDL 2.8   No results found for: "HGBA1C"  Procedures since last visit: No results found.  Assessment/Plan 1. Weight loss Discussed about ordering CT scan  He has appointment with Dr Rema Fendt  in [facility] and he is going to see if they are going to order any follow up Continue to try Supplements 2. Pulmonary nodules Follow up with his Pulmonary Dr Julious Payer  3. Gastroesophageal reflux disease without esophagitis Continue PPI Per pulmonary   4. Parkinson's disease with dyskinesia and fluctuating manifestations Sinemet Doing well  5. Hyperlipidemia, unspecified hyperlipidemia type On statin Repeat Labs  6. Primary insomnia Melatonin  7. Dizziness Was not orthostatic here Using Walker now Will Check his BP at home 8 Slow transit constipation Can take  Miralax PRn   9 ? H/o Hematuria  Wants to wait No More recent Episodes 10 Goals of Care Patient is DNR  Signed today  Labs/tests ordered:  * No order type specified * Next appt:  Visit date not found

## 2023-08-16 DIAGNOSIS — R490 Dysphonia: Secondary | ICD-10-CM | POA: Diagnosis not present

## 2023-08-16 DIAGNOSIS — R918 Other nonspecific abnormal finding of lung field: Secondary | ICD-10-CM | POA: Diagnosis not present

## 2023-08-16 DIAGNOSIS — C61 Malignant neoplasm of prostate: Secondary | ICD-10-CM | POA: Diagnosis not present

## 2023-08-16 DIAGNOSIS — R053 Chronic cough: Secondary | ICD-10-CM | POA: Diagnosis not present

## 2023-09-21 ENCOUNTER — Other Ambulatory Visit: Payer: Self-pay | Admitting: Neurology

## 2023-09-21 DIAGNOSIS — G20A1 Parkinson's disease without dyskinesia, without mention of fluctuations: Secondary | ICD-10-CM

## 2023-09-21 DIAGNOSIS — Z23 Encounter for immunization: Secondary | ICD-10-CM | POA: Diagnosis not present

## 2023-09-23 NOTE — Progress Notes (Unsigned)
Assessment/Plan:    Parkinson's disease, diagnosed June, 2023 but suspect that patient has had disease for quite some time prior to that -Continue carbidopa/levodopa 25/100, 2/2/2.  ***   2. Sialorrhea             -This is commonly associated with PD.  We talked about treatments.  The patient is not a candidate for oral anticholinergic therapy because of increased risk of confusion and falls.  We can discuss botox in future if more problematic for patient.  He denies he even has it but clearly does on examination   3. Myoclonus/mini-myoclonus and fasiculations             -resolved after started levodopa  -tried to order MRI cervical spine but pt didn't respond when they tried to contact him and decided to hold after that since symptoms got better.             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  Pt asymptommatic in all those regards.   4.  MCI             -Patient with neurocognitive testing in December, 2023. 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  -discussed again no driving.  He has been driving a bit and told him I don't want him doing this.  Even if cognition is good, his reflexes are not.  Dr. Milbert Coulter also supported this recommendation in December, 2023.  5.  Dizziness  -asked him to see at friends home (he lives independently) if they can start tracking BP, both sitting and standing.  He plans to get a cuff  -increase water intake and can liberalize salt intake a bit  Subjective:   Johnny [name] was seen today in follow up for Parkinsons disease, diagnosed last visit.  My previous records were reviewed prior to todays visit as well as outside records available to me.  I saw increased the patient's levodopa and again told him not to spread it out so far.  I did  not want him taking the last one at bedtime.  He reports today that ***.  Last visit he was complaining about some dizziness and I asked him to start tracking blood pressures at friend's home.  I do see he talk to Dr. Chales Abrahams about that in August, but she noted that he had not been tracking blood pressures, as suggested by [place].  He was not orthostatic in her office.  Dr. Chales Abrahams also noted that patient had been driving, despite our recommendations.  He saw pulmonary on September 10 regarding stable pulmonary nodules.  It was felt that there is really nothing more to do from that standpoint.  Notes reviewed.  Current prescribed movement disorder medications: Carbidopa/levodopa 25/100, 2 tablets 3 times per day (increased)    ALLERGIES:  No Known Allergies  CURRENT MEDICATIONS:  No outpatient medications have been marked as taking for the 09/27/23 encounter (Appointment) with Aranda Bihm, Octaviano Batty, DO.     Objective:   PHYSICAL EXAMINATION:    VITALS:   There were no vitals filed for this visit.    GEN:  The patient appears stated age and is in NAD. HEENT:  Normocephalic, atraumatic.  The mucous membranes are moist. The superficial temporal arteries are without ropiness or tenderness. CV:  RRR Lungs  CTAB   Neurological examination:  Orientation: The patient is alert and oriented x3. Cranial nerves: There is good facial symmetry with facial hypomimia. The speech is fluent and clear. Soft palate rises symmetrically and there is no tongue deviation. Hearing is intact to conversational tone. Sensation: Sensation is intact to light touch throughout Motor: Strength is at least antigravity x4.  Movement examination: Tone: There is mild to mod increased tone in the RUE/RLE.  Tone is mild increased on the lue Abnormal movements: no tremor today.  No axial myoclonus.  No mini myoclonus.   Coordination:  There is mid decremation with RAM's, with any form of RAMS, including alternating supination and  pronation of the forearm, hand opening and closing, finger taps, heel taps and toe taps.   The toe taps on the L are slow Gait and Station: The patient pushes off of the chair to arise.  Needs no assistance.  Has no assistive device today.  He is not shuffling.  He is flexed at waist.  He is short stepped.    I have reviewed and interpreted the following labs independently    Chemistry      Component Value Date/Time   NA 139 03/10/2023 0831   NA 139 05/18/2022 0000   K 3.9 03/10/2023 0831   CL 103 03/10/2023 0831   CO2 31 03/10/2023 0831   BUN 23 03/10/2023 0831   BUN 19 05/18/2022 0000   CREATININE 0.98 03/10/2023 0831   GLU 90 05/18/2022 0000      Component Value Date/Time   CALCIUM 9.3 03/10/2023 0831   ALKPHOS 56 05/10/2022 0000   AST 14 03/10/2023 0831   ALT <3 (L) 03/10/2023 0831   BILITOT 0.6 03/10/2023 0831       Lab Results  Component Value Date   WBC 5.2 08/26/2022   HGB 11.6 (L) 08/26/2022   HCT 33.4 (L) 08/26/2022   MCV 94.4 08/26/2022   PLT 187 08/26/2022    Lab Results  Component Value Date   TSH 2.758 05/03/2022     Total time spent on today's visit was *** minutes including both face-to-face time and nonface-to-face time.  Time included that spent on review of records (prior notes available to me/labs/imaging if pertinent), discussing treatment and goals, answering patient's questions and coordinating care.  Cc:  Mahlon Gammon, MD

## 2023-09-27 ENCOUNTER — Ambulatory Visit (INDEPENDENT_AMBULATORY_CARE_PROVIDER_SITE_OTHER): Payer: Medicare Other | Admitting: Neurology

## 2023-09-27 ENCOUNTER — Encounter: Payer: Self-pay | Admitting: Neurology

## 2023-09-27 VITALS — BP 120/62 | HR 75 | Ht 69.5 in | Wt 149.4 lb

## 2023-09-27 DIAGNOSIS — R42 Dizziness and giddiness: Secondary | ICD-10-CM | POA: Diagnosis not present

## 2023-09-27 DIAGNOSIS — G20A1 Parkinson's disease without dyskinesia, without mention of fluctuations: Secondary | ICD-10-CM | POA: Diagnosis not present

## 2023-09-27 NOTE — Patient Instructions (Addendum)
Here are some resources/books that you may find helpful as you navigate the challenges of Parkinson's Disease  1.  Parkinson's treatement: 10 secrets to a happier life by Jefferey Pica, MD 2.  Navigating Life with Parkinsons disease by Sotirios Parashos 3.  My degeneration: A journey through Parkinsons Ledora Bottcher - Sh[place]) 4.  Every Victory counts (I believe this one if free through [employer]) 5.  Lucky Man by Gardner Candle 6.  101 Questions & Answers about Parkinson's by Caprice Renshaw 7.  Parkinsons Disease Treatment Book by JE Ahiskog  Local and Online Resources for Power over Parkinson's Group?  October 2024  ?  LOCAL [place] PARKINSON'S GROUPS??  Power over [employer] Group:???  Power Over Parkinson's Patient Education Group -NO GROUP MEETING in October due to the Parkinson's Symposium Upcoming Power over [employer] Meetings/Care Partner Support:? 2nd Wednesdays of the month at 2 pm: Next meeting November 13th Contact Amy Marriott at [email or web address] or Lynwood Dawley at [email or web address] if interested in participating in this group?  ?  LOCAL EVENTS AND NEW OFFERINGS?  Parkinson's Social Game Night.  First Thursday of each month, 2:00-4:00 pm.  Rossie Muskrat [facility] [place].  Contact [email or web address] if interested.  2024 Movement Disorders Symposium.  Friday, September 16, 2023 9 am-2 pm.  [place] and [place], [address] [place].  Free to attend; registration required.  Register at [email or web address] Parkinson's CarePartner Group for Men is in the works, if interested email Sarah ?Doctor, [email or web address]  [employer] Chair Yoga classes "Train and Gain", Fridays 10 am, [employer].  Contact Gina at [phone].   PWR! Moves [place] class!  Wednesdays at 10 am.  Please contact Lonia Blood, PT at [email or web address] if interested. Health visitor  Classes offering at [employer]!? Tuesdays (Chair Yoga)  and Wednesdays (PWR! Moves)  1:00 pm.?? Contact Aldona Lento [phone] or [email or web address] Drumming for Parkinson's will be held on 2nd and 4th Mondays at 11:00 am.?? Located at the [place] of the [employer] ([address] [place].)? Contact Albertina Parr at [email or web address] or [phone]?  [facility] [employer] Parkinson's Tai Chi Class, Mondays at 11 am.  Call [phone] for details  [facility] at [facility]- [address], [place] Wednesdays- 4:00 - 5:00 PM - specifically for Parkinson's Disease.  Free!  Contact Denny Peon, [place] - [phone] (clinic) or  [phone] (cell) or by email: [email or web address]   ?ONLINE EDUCATION AND SUPPORT?  Parkinson Foundation:? www.parkinson.org?  PD Health at Home continues:? Mindfulness Mondays, Wellness Wednesdays, Fitness Fridays??  Upcoming Education:??  Safe Movement in the Hospital.   Wednesday, October 2, 1-2 pm  Parkinson's 101:  What you and your family should know.  Wednesday, October 16th,  1-2:15 pm  Navigating Long Term Care with Parkinson's.   Tuesday, October 22nd, 1-2pm  Gene and Cell Based therapies in Parkinson's.  Wednesday, October 30th, 1-2 pm Expert Briefing:    More than PD:  Managing Multiple chronic Conditions.  Wednesday, October 9th, 1-2 pm What's on your mind?  Thinking and memory changes.  Wednesday, November 13th, 1-2 pm Register for virtual education and Photographer (webinars) at [email or web address]  Please check out their website to sign up for emails and see their full online offerings??  ?  [employer]ard[employer] Foundation:? www.[email or web address]??  Third Thursday Webinars:? On the third Thursday of every month at 12 p.m. ET, join our free live webinars to learn about various aspects of living with Parkinson's disease and  our work to speed  medical breakthroughs.?  Upcoming Webinar:? Hitting Stride:  Research Advances on Walking with Parkinson's.  Thursday, October 17th  at 12 noon.  Check out additional information on their website to see their full online offerings?  ?  [employer]:? www.[email or web address]?  Upcoming Webinar:   Stay tuned/check website Series:? Living with Parkinson's Meetup.?? Third Thursdays each month, 3 pm?  Care Partner Monthly Meetup.? With Jillene Bucks [place].? First Tuesday of each month, 2 pm?  Check out additional information to Live Well Today on their website?  ?  Parkinson and Movement Disorders (PMD) Alliance:? www.pmdalliance.org?  NeuroLife Online:? Online Education Events?  Sign up for emails, which are sent weekly to give you updates on programming and online offerings?  ?  Parkinson's Association of [place]:? www.parkinsonassociation.org?  Information on online support groups, education events, and online exercises including Yoga, Parkinson's exercises and more-LOTS of information on links to PD resources and online events?  Virtual Support Group through [employer] of the [place]-October 2nd at 2 pm Save the Date:  Merchandiser, retail.  October 08, 2023 9am-4 pm. [facility].  Registration Coming soon.   MOVEMENT AND EXERCISE OPPORTUNITIES?  PWR! Moves [facility] [place] class has returned!  Wednesdays at 10 am.  Please contact Lonia Blood, PT at [email or web address] if interested. Parkinson's Exercise Class offerings at [employer]. Tuesdays (Chair yoga) and Wednesdays (PWR! Moves)  1:00 pm.?  Contact Aldona Lento [phone] or [email or web address]  Parkinson's Wellness Recovery (PWR! Moves)? www.pwr4life.org?  Info on the PWR! Virtual Experience:? You will have access to our expertise?through self-assessment, guided plans that start with the PD-specific fundamentals, educational content, tips, Q&A with an expert,  and a growing Engineering geologist of PD-specific pre-recorded and live exercise classes of varying types and intensity - both physical and cognitive! If that is not enough, we offer 1:1 wellness consultations (in-person or virtual) to personalize your PWR! Dance movement psychotherapist.??  Parkinson [employer] Fridays:??  As part of the PD Health @ Home program, this free video series focuses each week on one aspect of fitness designed to support people living with Parkinson's.? These weekly videos highlight the Parkinson Foundation fitness guidelines for people with Parkinson's disease.?  [email or web address].[email or web address]?  Dance for PD website is offering free, live-stream classes throughout the week, as well as links to [employer] of classes:? [email or web address]/?  Virtual dance and Pilates for Parkinson's classes: Click on the Community Tab> Parkinson's Movement Initiative Tab.? To register for classes and for more information, visit [email or web address].[email or web address] and click the "community" tab.??  [employer] Parkinson's Cycling Classes??  [facility]:? Thursdays @ Noon-Live classes at [employer] ([employer] at [email or web address]?or [phone])?  [facility]: Classes Tuesday, Wednesday and Thursday (contact [place] at [email or web address] ?or [phone])?  [place] [employer]?  Varied levels of classes are offered Tuesdays and Thursdays at [facility].??  Stretching with Byrd Hesselbach weekly class is also offered for people with Parkinson's?  To observe a class or for more information, call [phone] or email Patricia Nettle at [email or web address]?    ADDITIONAL SUPPORT AND RESOURCES?  [employer]:  Chiropractor:? www.well-springsolutions.org/caregiver-education/caregiver-support-group.? You may also contact Loleta Chance at [facility] -spring.org or [phone].????   [employer] Navigator:? Just1Navigator program, a?free service to help individuals and families through the journey of determining care for older adults.? The "Navigator" is a Child psychotherapist, Sidney Ace, who will speak with a prospective client and/or loved ones to provide an assessment of the situation and a set of recommendations for a personalized  care plan -- all free of charge, and whether?[employer] offers the needed service or not. If the need is not a service we provide, we are well-connected with reputable programs in town that we can refer you to.? www.well-springsolutions.org or to speak with the Navigator, call [phone].?

## 2023-11-17 ENCOUNTER — Other Ambulatory Visit: Payer: Medicare Other

## 2023-11-17 DIAGNOSIS — R634 Abnormal weight loss: Secondary | ICD-10-CM | POA: Diagnosis not present

## 2023-11-17 DIAGNOSIS — E785 Hyperlipidemia, unspecified: Secondary | ICD-10-CM | POA: Diagnosis not present

## 2023-11-17 DIAGNOSIS — R42 Dizziness and giddiness: Secondary | ICD-10-CM | POA: Diagnosis not present

## 2023-11-18 LAB — COMPLETE METABOLIC PANEL WITH GFR
AG Ratio: 2.2 (calc) (ref 1.0–2.5)
ALT: <3 U/L — ABNORMAL LOW (ref 9–46)
AST: 13 U/L (ref 10–35)
Albumin: 4.3 g/dL (ref 3.6–5.1)
Alkaline phosphatase (APISO): 92 U/L (ref 35–144)
BUN: 21 mg/dL (ref 7–25)
CO2: 31 mmol/L (ref 20–32)
Calcium: 9.4 mg/dL (ref 8.6–10.3)
Chloride: 103 mmol/L (ref 98–110)
Creat: 1 mg/dL (ref 0.70–1.22)
Globulin: 2 g/dL (ref 1.9–3.7)
Glucose, Bld: 94 mg/dL (ref 65–99)
Potassium: 4.1 mmol/L (ref 3.5–5.3)
Sodium: 138 mmol/L (ref 135–146)
Total Bilirubin: 0.6 mg/dL (ref 0.2–1.2)
Total Protein: 6.3 g/dL (ref 6.1–8.1)
eGFR: 74 mL/min/{1.73_m2} (ref >=60)

## 2023-11-18 LAB — CBC WITH DIFFERENTIAL/PLATELET
Absolute Lymphocytes: 950 {cells}/uL (ref 850–3900)
Absolute Monocytes: 459 {cells}/uL (ref 200–950)
Basophils Absolute: 11 {cells}/uL (ref 0–200)
Basophils Relative: 0.2 %
Eosinophils Absolute: 119 {cells}/uL (ref 15–500)
Eosinophils Relative: 2.2 %
HCT: 38 % — ABNORMAL LOW (ref 38.5–50.0)
Hemoglobin: 12.6 g/dL — ABNORMAL LOW (ref 13.2–17.1)
MCH: 32.5 pg (ref 27.0–33.0)
MCHC: 33.2 g/dL (ref 32.0–36.0)
MCV: 97.9 fL (ref 80.0–100.0)
MPV: 9.5 fL (ref 7.5–12.5)
Monocytes Relative: 8.5 %
Neutro Abs: 3861 {cells}/uL (ref 1500–7800)
Neutrophils Relative %: 71.5 %
Platelets: 179 10*3/uL (ref 140–400)
RBC: 3.88 10*6/uL — ABNORMAL LOW (ref 4.20–5.80)
RDW: 12.4 % (ref 11.0–15.0)
Total Lymphocyte: 17.6 %
WBC: 5.4 10*3/uL (ref 3.8–10.8)

## 2023-11-18 LAB — LIPID PANEL
Cholesterol: 147 mg/dL (ref <200)
HDL: 51 mg/dL (ref >=40)
LDL Cholesterol (Calc): 78 mg/dL
Non-HDL Cholesterol (Calc): 96 mg/dL (ref <130)
Total CHOL/HDL Ratio: 2.9 (calc) (ref <5.0)
Triglycerides: 96 mg/dL (ref <150)

## 2023-11-18 LAB — TSH: TSH: 3.63 m[IU]/L (ref 0.40–4.50)

## 2023-11-23 ENCOUNTER — Encounter: Payer: Self-pay | Admitting: Internal Medicine

## 2023-11-23 ENCOUNTER — Non-Acute Institutional Stay: Payer: Medicare Other | Admitting: Internal Medicine

## 2023-11-23 VITALS — BP 120/75 | HR 88 | Temp 97.7°F | Resp 17 | Ht 69.5 in | Wt 150.0 lb

## 2023-11-23 DIAGNOSIS — E785 Hyperlipidemia, unspecified: Secondary | ICD-10-CM | POA: Diagnosis not present

## 2023-11-23 DIAGNOSIS — F5101 Primary insomnia: Secondary | ICD-10-CM | POA: Diagnosis not present

## 2023-11-23 DIAGNOSIS — G20B2 Parkinson's disease with dyskinesia, with fluctuations: Secondary | ICD-10-CM

## 2023-11-23 DIAGNOSIS — R634 Abnormal weight loss: Secondary | ICD-10-CM

## 2023-11-23 DIAGNOSIS — R4189 Other symptoms and signs involving cognitive functions and awareness: Secondary | ICD-10-CM | POA: Diagnosis not present

## 2023-11-23 DIAGNOSIS — R682 Dry mouth, unspecified: Secondary | ICD-10-CM | POA: Diagnosis not present

## 2023-11-23 DIAGNOSIS — R918 Other nonspecific abnormal finding of lung field: Secondary | ICD-10-CM

## 2023-11-23 DIAGNOSIS — K219 Gastro-esophageal reflux disease without esophagitis: Secondary | ICD-10-CM

## 2023-11-23 DIAGNOSIS — R42 Dizziness and giddiness: Secondary | ICD-10-CM | POA: Diagnosis not present

## 2023-11-23 DIAGNOSIS — K5901 Slow transit constipation: Secondary | ICD-10-CM

## 2023-11-23 NOTE — Progress Notes (Unsigned)
Location:  [facility] Service:  Clinic (12)  Provider:   Code Status: DNR Goals of Care:     11/23/2023   11:11 AM  Advanced Directives  Does Patient Have a Medical Advance Directive? Yes  Type of Estate agent of [place];Living will;Out of facility DNR (pink MOST or yellow form)  Does patient want to make changes to medical advance directive? No - Patient declined  Copy of Healthcare Power of Attorney in Chart? No - copy requested     Chief Complaint  Patient presents with   Medical Management of Chronic Issues    Patient is being seen for a 4 month follow up and having very dry mouth recently    HPI: Patient is a 82 y.o. male seen today for medical management of chronic diseases.   [place] in [facility]  Cough  Weight loss Doing better Weight is now stabilized and Improved Wt Readings from Last 3 Encounters:  11/23/23 150 lb (68 kg)  09/27/23 149 lb 6.4 oz (67.8 kg)  07/20/23 145 lb 9.6 oz (66 kg)    Dizziness Today he says it is resolved and he is not Dizzy anymore  Uses Walker as needed No fall Parkinson disease Sinemet increased Doing well Does see some improvement  Cognitive impairment Still driving short distance Dr Tat has told him not drive at all Neurocognitive testing per Dr Milbert Coulter  Has high risk for  PDD profound impairment surrounding processing speed, cognitive flexibility, verbal fluency, and encoding (i.e, learning) aspects of memory   I noticed more word finding issue today. Per him he is till doing his IADLS and doing well Organizes his meds himself and hopefully is compliant Past Medical History:  Diagnosis Date   Abnormal CXR 02/01/2017   See cxr 12/27/16 with min atx L HD elevation with no comparison studies    Acute metabolic encephalopathy 05/03/2022   AKI (acute kidney injury) 05/06/2022   Allergic rhinitis 12/18/2013   Pt has a follow up with Dr Beaulah Dinning. He was tested for Israel Pig allergy  and it was normal.`[id number]`IMPRESSION: Wants to try Grastek after viewing ad for same.  History of inadequately controlled allergy symptoms.   Ambulatory dysfunction 05/07/2022   Aortic atherosclerosis 05/04/2022   Benign neoplasm of colon 03/07/2013   Had 2 polyps 5 years ago. Needs repeat colonoscopy   Bilateral impacted cerumen 05/29/2018   Bilateral inguinal hernia (BIH) 10/31/2013   s/p lap repair w mesh 10/15/2013    Constipation 05/04/2022   Decubitus ulcer of left foot, stage 1 05/03/2022   Delirium 05/06/2022   Dysphagia, pharyngoesophageal phase 02/14/2014   Dysphonia 05/14/2014   Fall at home 05/03/2022   Gastroesophageal reflux disease without esophagitis 01/07/2020   He has a known history of this but feels the symptoms he is experiencing at night with excess acid is a fairly recent problem. 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  Dr. Laverle Patter, [facility] Formatting of this note might be different from the original. Overview: Overview: prostatectomy 2009 STORY: s/p bilateral nerve-sparing RLRP 01/01/2008.  Dr. 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  The goal is to have your total cholesterol < 200, the HDL (good cholesterol) >40, and the LDL (bad cholesterol) <100.  It is recomended that you follow a good low fat diet and exercise for 30 minutes 3-4 times a week.Formatting of this note might be different from the original. Overview: IMPRE   Old tear of lateral meniscus of right knee  01/05/2019   No significant pain, gives way though. No previous PT, Would like to see ortho for recommendations   Organic impotence    Pain in joint of right knee 01/20/2021   Pain in joint of right shoulder 05/11/2022   Pain of left hip joint 05/13/2022   Pain of right hip joint 05/13/2022   Paresthesia 09/04/2020   Parkinson's disease (HCC)    Pulmonary nodules 05/04/2022   Seborrheic dermatitis 01/05/2019   Recent exacerbation, ketoconazole cream BID until resolution, then PRN.   SOB (shortness of breath) 11/06/2020   Soft tissue mass 06/09/2021   Traumatic hematoma of right shoulder 05/03/2022   Traumatic rhabdomyolysis 05/03/2022   Trigger ring finger of right hand 12/23/2014   Upper airway cough syndrome 02/01/2017   DgEs 01/29/14 ? Asp > neg ST eval  - Esophageal motility is within normal limits for age. No tertiary  contractions observed. Normal gastroesophageal junction. No  gastroesophageal reflux occurred or was elicited. Prompt emptying of  contrast in the duodenum.  Response to gabapentin documented by Dr Delford Field / [facility] in 2016   - rechallenged with gabapentin 02/01/2017 > titrate up to 300 tid   Vitamin D deficiency 03/07/2013   Appropiate labs done today. We will send the results and adjust treatment as needed.   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   Weakness of left lower extremity 05/07/2022   Wears glasses     Past Surgical History:  Procedure Laterality Date   BRAVO PH STUDY N/A 05/06/2014   Procedure: BRAVO PH STUDY;  Surgeon: Hart Carwin, MD;  Location: [facility];  Service: Endoscopy;  Laterality: N/A;   ESOPHAGOGASTRODUODENOSCOPY (EGD) WITH PROPOFOL N/A 05/06/2014   Procedure: ESOPHAGOGASTRODUODENOSCOPY (EGD) WITH PROPOFOL;  Surgeon: Hart Carwin, MD;  Location: [facility];  Service: Endoscopy;  Laterality: N/A;   HYDROCELE EXCISION Left 1994   HYDROCELE EXCISION Right 10/15/2013   Procedure: HYDROCELECTOMY ADULT;  Surgeon: Kathi Ludwig, MD;   Location: [facility];  Service: Urology;  Laterality: Right;  with excision of multiple epidyidmal cyst   HYDROCELE EXCISION Left 10/14/2014   Procedure: EPIDIDYMAL CYST EXCISION, LEFT;  Surgeon: Kathi Ludwig, MD;  Location: [facility];  Service: Urology;  Laterality: Left;   INGUINAL HERNIA REPAIR Bilateral 10/15/2013   Procedure:  LAPAROSCOPIC EXPLORATION  with REPAIR OF BILATERAL INGUINAL HERNIA;  Surgeon: Ardeth Sportsman, MD;  Location: [facility];  Service: General;  Laterality: Bilateral;   INSERTION OF MESH Bilateral 10/15/2013   Procedure: INSERTION OF MESH;  Surgeon: Ardeth Sportsman, MD;  Location: [facility];  Service: General;  Laterality: Bilateral;   PILONIDAL CYST EXCISION  1970'S   ROBOT ASSISTED LAPAROSCOPIC RADICAL PROSTATECTOMY  01-01-2008  DR BORDEN   TENOTOMY ACHILLES TENDON Right 11-11-2005   TONSILLECTOMY     child    No Known Allergies  Outpatient Encounter Medications as of 11/23/2023  Medication Sig   ascorbic acid (VITAMIN C) 500 MG tablet Take 1 tablet (500 mg total)  by mouth daily.   aspirin EC 81 MG tablet Take 1 tablet (81 mg total) by mouth daily. Swallow whole.   atorvastatin (LIPITOR) 20 MG tablet TAKE 1 TABLET BY MOUTH ONCE DAILY *BOTTLE*   carbidopa-levodopa (SINEMET IR) 25-100 MG tablet TAKE 2 TABLETS BY MOUTH THREE TIMES A DAY 7AM, 11AM & 4PM *BOTTLE*   Cholecalciferol (VITAMIN D3) 50 MCG (2000 UT) TABS Take 1 tablet by mouth daily.   folic acid (FOLVITE) 1 MG tablet Take 1 tablet (1 mg total) by mouth daily.   melatonin 5 MG TABS Take 1 tablet (5 mg total) by mouth every evening. For insomnia   Multiple Vitamin (MULTIVITAMIN WITH MINERALS) TABS tablet Take 1 tablet by mouth daily.   omeprazole (PRILOSEC) 40 MG capsule TAKE 1 CAPSULE BY MOUTH EVERY NIGHT AT BEDTIME *BOTTLE*   polyethylene glycol (MIRALAX / GLYCOLAX) 17 g packet Take 17 g by mouth daily as needed.   zinc gluconate 50  MG tablet Take 50 mg by mouth daily.   Facility-Administered Encounter Medications as of 11/23/2023  Medication   bupivacaine (MARCAINE) 0.5 % (with pres) injection 50 mL    Review of Systems:  Review of Systems  Constitutional:  Negative for activity change, appetite change and unexpected weight change.  HENT: Negative.    Respiratory:  Positive for cough. Negative for shortness of breath.   Cardiovascular:  Negative for leg swelling.  Gastrointestinal:  Negative for constipation.  Genitourinary:  Negative for frequency.  Musculoskeletal:  Positive for gait problem. Negative for arthralgias and myalgias.  Skin: Negative.  Negative for rash.  Neurological:  Negative for dizziness and weakness.  Psychiatric/Behavioral:  Negative for confusion and sleep disturbance.   All other systems reviewed and are negative.   Health Maintenance  Topic Date Due   DTaP/Tdap/Td (1 - Tdap) Never done   Zoster Vaccines- Shingrix (1 of 2) Never done   COVID-19 Vaccine (7 - 2024-25 season) 12/09/2023   Medicare Annual Wellness (AWV)  04/03/2024   Pneumonia Vaccine 42+ Years old  Completed   INFLUENZA VACCINE  Completed   HPV VACCINES  Aged Out    Physical Exam: Vitals:   11/23/23 1111  BP: 120/75  Pulse: 88  Resp: 17  Temp: 97.7 F (36.5 C)  TempSrc: Temporal  SpO2: 92%  Weight: 150 lb (68 kg)  Height: 5' 9.5" (1.765 m)   Body mass index is 21.83 kg/m. Physical Exam Vitals reviewed.  Constitutional:      Appearance: Normal appearance.  HENT:     Head: Normocephalic.     Nose: Nose normal.     Mouth/Throat:     Mouth: Mucous membranes are moist.     Pharynx: Oropharynx is clear.  Eyes:     Pupils: Pupils are equal, round, and reactive to light.  Cardiovascular:     Rate and Rhythm: Normal rate and regular rhythm.     Pulses: Normal pulses.     Heart sounds: No murmur heard. Pulmonary:     Effort: Pulmonary effort is normal. No respiratory distress.     Breath sounds:  Normal breath sounds. No rales.  Abdominal:     General: Abdomen is flat. Bowel sounds are normal.     Palpations: Abdomen is soft.  Musculoskeletal:        General: No swelling.     Cervical back: Neck supple.  Skin:    General: Skin is warm.  Neurological:     General: No focal deficit present.  Mental Status: He is alert.     Comments: Some word finding difficulty  Psychiatric:        Mood and Affect: Mood normal.        Thought Content: Thought content normal.     Labs reviewed: Basic Metabolic Panel: Recent Labs    03/10/23 0831 11/17/23 0858  NA 139 138  K 3.9 4.1  CL 103 103  CO2 31 31  GLUCOSE 95 94  BUN 23 21  CREATININE 0.98 1.00  CALCIUM 9.3 9.4  TSH  --  3.63   Liver Function Tests: Recent Labs    03/10/23 0831 11/17/23 0858  AST 14 13  ALT <3* <3*  BILITOT 0.6 0.6  PROT 6.0* 6.3   No results for input(s): "LIPASE", "AMYLASE" in the last 8760 hours. No results for input(s): "AMMONIA" in the last 8760 hours. CBC: Recent Labs    11/17/23 0858  WBC 5.4  NEUTROABS 3,861  HGB 12.6*  HCT 38.0*  MCV 97.9  PLT 179   Lipid Panel: Recent Labs    11/17/23 0858  CHOL 147  HDL 51  LDLCALC 78  TRIG 96  CHOLHDL 2.9   No results found for: "HGBA1C"  Procedures since last visit: No results found.  Assessment/Plan 1. Parkinson's disease with dyskinesia and fluctuating manifestations (HCC) (Primary) Sinemet Sees Dr Tat [place] Apart[place]  2. Weight loss Weight has stabilized and he is doing well with his appetite  3. Pulmonary nodules They have been stable Follows with Dr Su Monks in [place]  4. Gastroesophageal reflux disease without esophagitis On Continue PPI Per pulmonary     5. Hyperlipidemia, unspecified hyperlipidemia type statin  6. Primary insomnia Melatonin  7. Dizziness He says it is Resolved Continue to monitor  8. Cognitive impairment He is managing his Meds and Finances D/w Dr Tat also he  would benefit by Aricpet or Exelon He refused today and want to talk next visit  9. Slow transit constipation Reminded again of use Miralax PRn  10. Dry mouth  11 Cough Chronic Issue    Labs/tests ordered:  * No order type specified * Next appt:  Visit date not found

## 2023-11-23 NOTE — Patient Instructions (Signed)
Can use Miralax to help Constipation

## 2024-03-13 ENCOUNTER — Telehealth: Payer: Self-pay | Admitting: Neurology

## 2024-03-13 ENCOUNTER — Other Ambulatory Visit: Payer: Self-pay | Admitting: Internal Medicine

## 2024-03-13 ENCOUNTER — Other Ambulatory Visit: Payer: Self-pay

## 2024-03-13 DIAGNOSIS — G20A1 Parkinson's disease without dyskinesia, without mention of fluctuations: Secondary | ICD-10-CM

## 2024-03-13 MED ORDER — CARBIDOPA-LEVODOPA 25-100 MG PO TABS: ORAL_TABLET | ORAL | 0 refills | Status: DC

## 2024-03-13 MED ORDER — ATORVASTATIN CALCIUM 20 MG PO TABS: 20.0000 mg | ORAL_TABLET | Freq: Every day | ORAL | 1 refills | Status: DC

## 2024-03-13 NOTE — Telephone Encounter (Signed)
 Pres[facility] [address] and called patient to make him aware

## 2024-03-13 NOTE — Telephone Encounter (Signed)
 1. Which medications need to be refilled? (please list name of each medication and dose if known) carbidopa-levodopa (SINEMET IR) 25-100 MG tablet [[id number]]   2. Which pharmacy/location (including street and city if local pharmacy) is medication to be sent to? [facility] [address]  3. Do they need a 30 day or 90 day supply?

## 2024-03-13 NOTE — Telephone Encounter (Signed)
 Copied from CRM [id number]. Topic: Clinical - Medication Refill >> Mar 13, 2024  1:06 PM Johnny [name] wrote: Most Recent Primary Care Visit:  Provider: Mahlon Gammon  Department: [facility]  Visit Type: [facility]ED [facility]IV[facility]  Date: 11/23/2023  Medication: atorvastatin (LIPITOR) 20 MG tablet   Has the patient contacted their pharmacy? Yes (Agent: If no, request that the patient contact the pharmacy for the refill. If patient does not wish to contact the pharmacy document the reason why and proceed with request.) (Agent: If yes, when and what did the pharmacy advise?)  Patient called pharmacy and was advised to contact provider.   Is this the correct pharmacy for this prescription? Yes If no, delete pharmacy and type the correct one.  This is the patient's preferred pharmacy:  [id number] [place], [place] - [address] [place] [address] Phone: [phone] Fax: [phone]   Has the prescription been filled recently? No  Is the patient out of the medication? No  Has the patient been seen for an appointment in the last year OR does the patient have an upcoming appointment? Yes  Can we respond through MyChart? No  Agent: Please be advised that Rx refills may take up to 3 business days. We ask that you follow-up with your pharmacy.

## 2024-03-30 NOTE — Progress Notes (Signed)
 Assessment/Plan:    Parkinson's disease, diagnosed June, 2023 but suspect that patient has had disease for quite some time prior to that -Continue carbidopa /levodopa  25/100, 2/2/2.  -declines PT    2. Sialorrhea             -This is commonly associated with PD.  We talked about treatments.  The patient is not a candidate for oral anticholinergic therapy because of increased risk of confusion and falls.  We can discuss botox in future if more problematic for patient.  He denies he even has it but clearly does on examination   3. Myoclonus/mini-myoclonus and fasiculations             -resolved after started levodopa   -tried to order MRI cervical spine but pt didn't respond when they tried to contact him and decided to hold after that since symptoms got better.             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  Pt asymptommatic in all those regards.   4.  Memory change             -Patient with neurocognitive testing in December, 2023. 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  -discussed again no driving.  He is no longer driving.  Dr. Kitty Perkins also supported this recommendation in December, 2023.  - He had I discussed repeating neurocognitive testing versus just adding donepezil now.  We could also do both of these things.  He declines both  5.  Dizziness  pcp is monitoring. Pt states that this is accompanied by this.  Suspect that this is a lack of hydration issue  -increase water  intake and can liberalize salt intake a bit  6.  Weight loss  -he reports appetite decrease but also blames on quality of food  -discussed increasing protein and adding protein supplements/shakes  -add mirtazapine , 15 mg at bed.  He doesn't want to use [facility] where he has gone before but wants pharmacy that delivers.  Sent to [facility].    -looked at review flowsheets and most of the weight loss (14 lb) has been since December.  -f/u pcp for other issues  Subjective:   Johnny [name] was seen today in follow up for Parkinsons disease, diagnosed last visit.  My previous records were reviewed prior to todays visit as well as outside records available to me.  I also spoke to his primary care physician since our last visit about his case.  He said no falls.  No hallucinations.  Primary care noted that patient had declined donepezil, but thought he might benefit from this.  Patient not interested.  Pt having some neck/shoulder "discomfort."  Having constipation - taking miralax .  He reports drinking not enough water .  He is exercising daily.  No falls.  Some headache and the dizziness goes along with this.  Weight loss.  Admits "I'm eating but not enough."  States quality of food isn't good.  He denies depression.  Memory is "not great."  Current prescribed movement disorder medications: Carbidopa /levodopa  25/100, 2 tablets 3 times per day     ALLERGIES:  No Known Allergies  CURRENT MEDICATIONS:  Current Meds  Medication Sig   ascorbic acid (VITAMIN C ) 500 MG tablet Take  1 tablet (500 mg total) by mouth daily.   aspirin  EC 81 MG tablet Take 1 tablet (81 mg total) by mouth daily. Swallow whole.   atorvastatin  (LIPITOR) 20 MG tablet Take 1 tablet (20 mg total) by mouth daily.   carbidopa -levodopa  (SINEMET  IR) 25-100 MG tablet TAKE 2 TABLETS BY MOUTH THREE TIMES A DAY 7AM, 11AM & 4PM *BOTTLE*   Cholecalciferol (VITAMIN D3) 50 MCG (2000 UT) TABS Take 1 tablet by mouth daily.   folic acid  (FOLVITE ) 1 MG tablet Take 1 tablet (1 mg total) by mouth daily.   melatonin 5 MG TABS Take 1 tablet (5 mg total) by mouth every evening. For insomnia   Multiple Vitamin (MULTIVITAMIN WITH MINERALS) TABS tablet Take 1 tablet by mouth daily.    omeprazole  (PRILOSEC) 40 MG capsule TAKE 1 CAPSULE BY MOUTH EVERY NIGHT AT BEDTIME *BOTTLE*   polyethylene glycol (MIRALAX  / GLYCOLAX ) 17 g packet Take 17 g by mouth daily as needed.   zinc gluconate 50 MG tablet Take 50 mg by mouth daily.     Objective:   PHYSICAL EXAMINATION:    VITALS:   Vitals:   04/03/24 0907  BP: 116/72  Pulse: 76  SpO2: 96%  Weight: 136 lb (61.7 kg)  Height: 5\' 8"  (1.727 m)       GEN:  The patient appears stated age and is in NAD. HEENT:  Normocephalic, atraumatic.  The mucous membranes are moist. The superficial temporal arteries are without ropiness or tenderness. CV:  RRR Lungs CTAB   Neurological examination:  Orientation: The patient is alert and oriented x3.  He is slow to answer but generally accurate Cranial nerves: There is good facial symmetry with facial hypomimia. The speech is fluent and clear. Soft palate rises symmetrically and there is no tongue deviation. Hearing is intact to conversational tone. Sensation: Sensation is intact to light touch throughout Motor: Strength is at least antigravity x4.  Movement examination: Tone: There is mild increased tone in the bilateral UE.  This is stable Abnormal movements: no tremor today.  No axial myoclonus.  No mini myoclonus.   Coordination:  There is mild decremation with RAM's, with any form of RAMS, including alternating supination and pronation of the forearm, hand opening and closing, finger taps, heel taps and toe taps bilaterally Gait and Station: The patient pushes off of the chair to arise.  Needs no assistance.  Has no assistive device today.  He is not shuffling.  He is flexed at waist.  He is short stepped.  This is same as previous  I have reviewed and interpreted the following labs independently    Chemistry      Component Value Date/Time   NA 138 11/17/2023 0858   NA 139 05/18/2022 0000   K 4.1 11/17/2023 0858   CL 103 11/17/2023 0858   CO2 31 11/17/2023 0858   BUN 21  11/17/2023 0858   BUN 19 05/18/2022 0000   CREATININE 1.00 11/17/2023 0858   GLU 90 05/18/2022 0000      Component Value Date/Time   CALCIUM  9.4 11/17/2023 0858   ALKPHOS 56 05/10/2022 0000   AST 13 11/17/2023 0858   ALT <3 (L) 11/17/2023 0858   BILITOT 0.6 11/17/2023 0858       Lab Results  Component Value Date   WBC 5.4 11/17/2023   HGB 12.6 (L) 11/17/2023   HCT 38.0 (L) 11/17/2023   MCV 97.9 11/17/2023   PLT 179 11/17/2023    Lab Results  Component Value Date   TSH 3.63 11/17/2023    Total time spent on today's visit was 30 minutes, including both face-to-face time and nonface-to-face time.  Time included that spent on review of records (prior notes available to me/labs/imaging if pertinent), discussing treatment and goals, answering patient's questions and coordinating care.  Cc:  Marguerite Shiley, MD

## 2024-04-03 ENCOUNTER — Other Ambulatory Visit: Payer: Self-pay

## 2024-04-03 ENCOUNTER — Other Ambulatory Visit (HOSPITAL_COMMUNITY): Payer: Self-pay

## 2024-04-03 ENCOUNTER — Ambulatory Visit (INDEPENDENT_AMBULATORY_CARE_PROVIDER_SITE_OTHER): Payer: Medicare Other | Admitting: Neurology

## 2024-04-03 VITALS — BP 116/72 | HR 76 | Ht 68.0 in | Wt 136.0 lb

## 2024-04-03 DIAGNOSIS — G20A1 Parkinson's disease without dyskinesia, without mention of fluctuations: Secondary | ICD-10-CM | POA: Diagnosis not present

## 2024-04-03 DIAGNOSIS — R634 Abnormal weight loss: Secondary | ICD-10-CM | POA: Diagnosis not present

## 2024-04-03 DIAGNOSIS — R413 Other amnesia: Secondary | ICD-10-CM | POA: Diagnosis not present

## 2024-04-03 MED ORDER — MIRTAZAPINE 15 MG PO TABS
15.0000 mg | ORAL_TABLET | Freq: Every day | ORAL | 1 refills | Status: DC
  Filled 2024-04-03 – 2024-04-09 (×2): qty 90, 90d supply, fill #0

## 2024-04-03 NOTE — Patient Instructions (Signed)
 I sent your medication to [facility], as requested by you.  You will need to contact them (or they will call you) to set up payment for the delivery.    Let me know if you change your mind on PT  The physicians and staff at [facility] are committed to providing excellent care. You may receive a survey requesting feedback about your experience at our office. We strive to receive "very good" responses to the survey questions. If you feel that your experience would prevent you from giving the office a "very good " response, please contact our office to try to remedy the situation. We may be reached at [phone]. Thank you for taking the time out of your busy day to complete the survey.

## 2024-04-06 ENCOUNTER — Other Ambulatory Visit: Payer: Self-pay

## 2024-04-09 ENCOUNTER — Telehealth: Payer: Self-pay | Admitting: Neurology

## 2024-04-09 ENCOUNTER — Other Ambulatory Visit (HOSPITAL_COMMUNITY): Payer: Self-pay

## 2024-04-09 ENCOUNTER — Other Ambulatory Visit: Payer: Self-pay

## 2024-04-09 NOTE — Telephone Encounter (Signed)
 Called Johnny [name] spoke to pharmacy. They have reached out a number of times to get copay and to get address verified. Patient had not returned calls so delivery had to be put back. I have given patient the pharmacy number and asked him to call them and they can get the delivery set up for him

## 2024-04-09 NOTE — Telephone Encounter (Signed)
 Pt called in and left a message with the after hours service on 04/07/24. Pt stated Dr. Winferd Hatter recommended Mirtazapine  and he wants a prescription sent in to his pharmacy. He did not specify the pharmacy

## 2024-05-17 ENCOUNTER — Other Ambulatory Visit: Payer: Medicare Other

## 2024-05-17 DIAGNOSIS — E785 Hyperlipidemia, unspecified: Secondary | ICD-10-CM | POA: Diagnosis not present

## 2024-05-17 DIAGNOSIS — R634 Abnormal weight loss: Secondary | ICD-10-CM | POA: Diagnosis not present

## 2024-05-17 DIAGNOSIS — G20B2 Parkinson's disease with dyskinesia, with fluctuations: Secondary | ICD-10-CM | POA: Diagnosis not present

## 2024-05-17 LAB — CBC WITH DIFFERENTIAL/PLATELET
Absolute Lymphocytes: 1135 {cells}/uL (ref 850–3900)
Absolute Monocytes: 400 {cells}/uL (ref 200–950)
Basophils Absolute: 0 {cells}/uL (ref 0–200)
Basophils Relative: 0 %
Eosinophils Absolute: 101 {cells}/uL (ref 15–500)
Eosinophils Relative: 2.3 %
HCT: 35.7 % — ABNORMAL LOW (ref 38.5–50.0)
Hemoglobin: 11.3 g/dL — ABNORMAL LOW (ref 13.2–17.1)
MCH: 31.4 pg (ref 27.0–33.0)
MCHC: 31.7 g/dL — ABNORMAL LOW (ref 32.0–36.0)
MCV: 99.2 fL (ref 80.0–100.0)
MPV: 9.4 fL (ref 7.5–12.5)
Monocytes Relative: 9.1 %
Neutro Abs: 2763 {cells}/uL (ref 1500–7800)
Neutrophils Relative %: 62.8 %
Platelets: 176 10*3/uL (ref 140–400)
RBC: 3.6 10*6/uL — ABNORMAL LOW (ref 4.20–5.80)
RDW: 12 % (ref 11.0–15.0)
Total Lymphocyte: 25.8 %
WBC: 4.4 10*3/uL (ref 3.8–10.8)

## 2024-05-17 LAB — LIPID PANEL
Cholesterol: 138 mg/dL (ref <200)
HDL: 57 mg/dL (ref >=40)
LDL Cholesterol (Calc): 67 mg/dL
Non-HDL Cholesterol (Calc): 81 mg/dL (ref <130)
Total CHOL/HDL Ratio: 2.4 (calc) (ref <5.0)
Triglycerides: 48 mg/dL (ref <150)

## 2024-05-18 LAB — COMPREHENSIVE METABOLIC PANEL WITH GFR
AG Ratio: 2 (calc) (ref 1.0–2.5)
ALT: <3 U/L — ABNORMAL LOW (ref 9–46)
AST: 12 U/L (ref 10–35)
Albumin: 4.1 g/dL (ref 3.6–5.1)
Alkaline phosphatase (APISO): 101 U/L (ref 35–144)
BUN/Creatinine Ratio: 25 (calc) — ABNORMAL HIGH (ref 6–22)
BUN: 27 mg/dL — ABNORMAL HIGH (ref 7–25)
CO2: 27 mmol/L (ref 20–32)
Calcium: 9.4 mg/dL (ref 8.6–10.3)
Chloride: 103 mmol/L (ref 98–110)
Creat: 1.06 mg/dL (ref 0.70–1.22)
Globulin: 2.1 g/dL (ref 1.9–3.7)
Glucose, Bld: 97 mg/dL (ref 65–99)
Potassium: 4.4 mmol/L (ref 3.5–5.3)
Sodium: 139 mmol/L (ref 135–146)
Total Bilirubin: 0.4 mg/dL (ref 0.2–1.2)
Total Protein: 6.2 g/dL (ref 6.1–8.1)
eGFR: 69 mL/min/{1.73_m2} (ref >=60)

## 2024-05-23 ENCOUNTER — Other Ambulatory Visit (HOSPITAL_COMMUNITY): Payer: Self-pay

## 2024-05-23 ENCOUNTER — Non-Acute Institutional Stay: Payer: Medicare Other | Admitting: Internal Medicine

## 2024-05-23 ENCOUNTER — Encounter: Payer: Self-pay | Admitting: Internal Medicine

## 2024-05-23 ENCOUNTER — Other Ambulatory Visit: Payer: Self-pay

## 2024-05-23 VITALS — BP 128/80 | HR 65 | Temp 97.6°F | Ht 68.0 in | Wt 136.6 lb

## 2024-05-23 DIAGNOSIS — K219 Gastro-esophageal reflux disease without esophagitis: Secondary | ICD-10-CM | POA: Diagnosis not present

## 2024-05-23 DIAGNOSIS — G20B2 Parkinson's disease with dyskinesia, with fluctuations: Secondary | ICD-10-CM | POA: Diagnosis not present

## 2024-05-23 DIAGNOSIS — R4189 Other symptoms and signs involving cognitive functions and awareness: Secondary | ICD-10-CM

## 2024-05-23 DIAGNOSIS — R918 Other nonspecific abnormal finding of lung field: Secondary | ICD-10-CM | POA: Diagnosis not present

## 2024-05-23 DIAGNOSIS — E785 Hyperlipidemia, unspecified: Secondary | ICD-10-CM | POA: Diagnosis not present

## 2024-05-23 DIAGNOSIS — R634 Abnormal weight loss: Secondary | ICD-10-CM

## 2024-05-23 MED ORDER — OMEPRAZOLE 40 MG PO CPDR
40.0000 mg | DELAYED_RELEASE_CAPSULE | Freq: Every day | ORAL | 3 refills | Status: DC
Start: 2024-05-23 — End: 2024-10-03
  Filled 2024-05-23: qty 90, 90d supply, fill #0

## 2024-05-23 NOTE — Progress Notes (Signed)
 [facility]  [facility] Service:  Clinic (12)  Provider:   Code Status: *** Goals of Care:     04/03/2024    9:08 AM  Advanced Directives  Does Patient Have a Medical Advance Directive? Yes  Type of Advance Directive Living will     Chief Complaint  Patient presents with  . Medical Management of Chronic Issues    6 month follow up with labs    HPI: Patient is a 83 y.o. male seen today for medical management of chronic diseases.     Past Medical History:  Diagnosis Date  . Abnormal CXR 02/01/2017   See cxr 12/27/16 with min atx L HD elevation with no comparison studies   . Acute metabolic encephalopathy 05/03/2022  . AKI (acute kidney injury) 05/06/2022  . Allergic rhinitis 12/18/2013   Pt has a follow up with Dr Hendricks Locker. He was tested for Israel Pig allergy and it was normal.`E1o3L`IMPRESSION: Wants to try Grastek after viewing ad for same.  History of inadequately controlled allergy symptoms.  . Ambulatory dysfunction 05/07/2022  . Aortic atherosclerosis 05/04/2022  . Benign neoplasm of colon 03/07/2013   Had 2 polyps 5 years ago. Needs repeat colonoscopy  . Bilateral impacted cerumen 05/29/2018  . Bilateral inguinal hernia (BIH) 10/31/2013   s/p lap repair w mesh 10/15/2013   . Constipation 05/04/2022  . Decubitus ulcer of left foot, stage 1 05/03/2022  . Delirium 05/06/2022  . Dysphagia, pharyngoesophageal phase 02/14/2014  . Dysphonia 05/14/2014  . Fall at home 05/03/2022  . Gastroesophageal reflux disease without esophagitis 01/07/2020   He has a known history of this but feels the symptoms he is experiencing at night with excess acid is a fairly recent problem. It is completely alleviated with Alka-Seltzer and Pepcid I recommended Pepcid nightly, Alka-Seltzer PRN  . GERD (gastroesophageal reflux disease)   . History of colonic polyps 09/10/2013  . Hydrocele, right 09/10/2013   s/p resection 10/15/2013   . 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 Malignant neoplasm of prostate    prostatectomy 2009; s/p bilateral nerve-sparing RLRP 01/01/2008.  Dr. Rozanne Corners, [facility] Formatting of this note might be different from the original. Overview: Overview: prostatectomy 2009 STORY: s/p bilateral nerve-sparing RLRP 01/01/2008.  Dr. Rozanne Corners, [facility] Overview: Overview: prosta  . 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  The goal is to have your total cholesterol < 200, the HDL (good cholesterol) >40, and the LDL (bad cholesterol) <100.  It is recomended that you follow a good low fat diet and exercise for 30 minutes 3-4 times a week.Formatting of this note might be different from the original. Overview: IMPRE  . Old tear of lateral meniscus of right knee 01/05/2019   No significant pain, gives way though. No previous PT, Would like to see ortho for recommendations  . Organic impotence   . Pain in joint of right knee 01/20/2021  . Pain in joint of right shoulder 05/11/2022  . Pain of left hip joint 05/13/2022  . Pain of right hip joint 05/13/2022  . Paresthesia 09/04/2020  . Parkinson's disease (HCC)   . Pulmonary nodules 05/04/2022  . Seborrheic dermatitis 01/05/2019   Recent exacerbation, ketoconazole cream BID until resolution, then PRN.  . SOB (shortness of breath) 11/06/2020  . Soft tissue mass 06/09/2021  . Traumatic hematoma of right shoulder 05/03/2022  . Traumatic rhabdomyolysis 05/03/2022  . Trigger ring  finger of right hand 12/23/2014  . Upper airway cough syndrome 02/01/2017   DgEs 01/29/14 ? Asp > neg ST eval  - Esophageal motility is within normal limits for age. No tertiary  contractions observed. Normal gastroesophageal junction. No  gastroesophageal reflux occurred or was elicited. Prompt emptying of  contrast in the duodenum.  Response to gabapentin  documented by Dr Brent Cambric / [facility] in 2016    - rechallenged with gabapentin  02/01/2017 > titrate up to 300 tid  . Vitamin D deficiency 03/07/2013   Appropiate labs done today. We will send the results and adjust treatment as needed.  . 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 Weakness of left lower extremity 05/07/2022  . Wears glasses     Past Surgical History:  Procedure Laterality Date  . BRAVO PH STUDY N/A 05/06/2014   Procedure: BRAVO PH STUDY;  Surgeon: Pietro Bridegroom, MD;  Location: [facility];  Service: Endoscopy;  Laterality: N/A;  . ESOPHAGOGASTRODUODENOSCOPY (EGD) WITH PROPOFOL  N/A 05/06/2014   Procedure: ESOPHAGOGASTRODUODENOSCOPY (EGD) WITH PROPOFOL ;  Surgeon: Pietro Bridegroom, MD;  Location: [facility];  Service: Endoscopy;  Laterality: N/A;  . HYDROCELE EXCISION Left 1994  . HYDROCELE EXCISION Right 10/15/2013   Procedure: HYDROCELECTOMY ADULT;  Surgeon: Edmund Gouge, MD;  Location: [facility];  Service: Urology;  Laterality: Right;  with excision of multiple epidyidmal cyst  . HYDROCELE EXCISION Left 10/14/2014   Procedure: EPIDIDYMAL CYST EXCISION, LEFT;  Surgeon: Edmund Gouge, MD;  Location: [facility];  Service: Urology;  Laterality: Left;  . INGUINAL HERNIA REPAIR Bilateral 10/15/2013   Procedure:  LAPAROSCOPIC EXPLORATION  with REPAIR OF BILATERAL INGUINAL HERNIA;  Surgeon: Eddye Goodie, MD;  Location: [facility];  Service: General;  Laterality: Bilateral;  . INSERTION OF MESH Bilateral 10/15/2013   Procedure: INSERTION OF MESH;  Surgeon: Eddye Goodie, MD;  Location: [facility];  Service: General;  Laterality: Bilateral;  . PILONIDAL CYST EXCISION  1970'S  . ROBOT ASSISTED LAPAROSCOPIC RADICAL PROSTATECTOMY  01-01-2008  DR Rozanne Corners  . TENOTOMY ACHILLES TENDON Right 11-11-2005  . TONSILLECTOMY     child    No Known Allergies  Outpatient Encounter Medications as of 05/23/2024  Medication Sig  . aspirin  EC 81 MG tablet Take 1 tablet (81 mg  total) by mouth daily. Swallow whole.  . atorvastatin  (LIPITOR) 20 MG tablet Take 1 tablet (20 mg total) by mouth daily.  . carbidopa -levodopa  (SINEMET  IR) 25-100 MG tablet TAKE 2 TABLETS BY MOUTH THREE TIMES A DAY 7AM, 11AM & 4PM *BOTTLE*  . folic acid  (FOLVITE ) 1 MG tablet Take 1 tablet (1 mg total) by mouth daily.  . melatonin 5 MG TABS Take 1 tablet (5 mg total) by mouth every evening. For insomnia  . omeprazole  (PRILOSEC) 40 MG capsule TAKE 1 CAPSULE BY MOUTH EVERY NIGHT AT BEDTIME *BOTTLE*  . polyethylene glycol (MIRALAX  / GLYCOLAX ) 17 g packet Take 17 g by mouth daily as needed.  Aaron Aas ascorbic acid (VITAMIN C ) 500 MG tablet Take 1 tablet (500 mg total) by mouth daily. (Patient not taking: Reported on 05/23/2024)  . Cholecalciferol (VITAMIN D3) 50 MCG (2000 UT) TABS Take 1 tablet by mouth daily. (Patient not taking: Reported on 05/23/2024)  . mirtazapine  (REMERON ) 15 MG tablet Take 1 tablet (15 mg total) by mouth at bedtime. (Patient not taking: Reported on 05/23/2024)  . Multiple Vitamin (MULTIVITAMIN WITH MINERALS) TABS tablet Take 1 tablet by mouth daily. (Patient not  taking: Reported on 05/23/2024)  . zinc gluconate 50 MG tablet Take 50 mg by mouth daily. (Patient not taking: Reported on 05/23/2024)   Facility-Administered Encounter Medications as of 05/23/2024  Medication  . bupivacaine  (MARCAINE ) 0.5 % (with pres) injection 50 mL    Review of Systems:  Review of Systems  Health Maintenance  Topic Date Due  . DTaP/Tdap/Td (1 - Tdap) Never done  . Zoster Vaccines- Shingrix (1 of 2) Never done  . Medicare Annual Wellness (AWV)  04/03/2024  . COVID-19 Vaccine (7 - Pfizer risk 2024-25 season) 07/06/2024 (Originally 04/12/2024)  . INFLUENZA VACCINE  07/06/2024  . Pneumococcal Vaccine: 50+ Years  Completed  . HPV VACCINES  Aged Out  . Meningococcal B Vaccine  Aged Out    Physical Exam: Vitals:   05/23/24 1112  BP: 128/80  Pulse: 65  Temp: 97.6 F (36.4 C)  SpO2: 96%  Weight: 136 lb  9.6 oz (62 kg)  Height: 5' 8 (1.727 m)   Body mass index is 20.77 kg/m. Physical Exam  Labs reviewed: Basic Metabolic Panel: Recent Labs    11/17/23 0858 05/17/24 0800  NA 138 139  K 4.1 4.4  CL 103 103  CO2 31 27  GLUCOSE 94 97  BUN 21 27*  CREATININE 1.00 1.06  CALCIUM  9.4 9.4  TSH 3.63 2.63   Liver Function Tests: Recent Labs    11/17/23 0858 05/17/24 0800  AST 13 12  ALT <3* <3*  BILITOT 0.6 0.4  PROT 6.3 6.2   No results for input(s): LIPASE, AMYLASE in the last 8760 hours. No results for input(s): AMMONIA in the last 8760 hours. CBC: Recent Labs    11/17/23 0858 05/17/24 0800  WBC 5.4 4.4  NEUTROABS 3,861 2,763  HGB 12.6* 11.3*  HCT 38.0* 35.7*  MCV 97.9 99.2  PLT 179 176   Lipid Panel: Recent Labs    11/17/23 0858 05/17/24 0800  CHOL 147 138  HDL 51 57  LDLCALC 78 67  TRIG 96 48  CHOLHDL 2.9 2.4   No results found for: HGBA1C  Procedures since last visit: No results found.  Assessment/Plan There are no diagnoses linked to this encounter.   Labs/tests ordered:  * No order type specified * Next appt:  Visit date not found

## 2024-05-23 NOTE — Patient Instructions (Addendum)
 Senna Plus one to two tablets at night for constipation with Miralax  I m ordering CT san for your Weight loss Restart your Remeron  Also making Referral for therapy

## 2024-06-17 ENCOUNTER — Other Ambulatory Visit: Payer: Self-pay | Admitting: Neurology

## 2024-06-17 DIAGNOSIS — G20A1 Parkinson's disease without dyskinesia, without mention of fluctuations: Secondary | ICD-10-CM

## 2024-06-22 ENCOUNTER — Ambulatory Visit (HOSPITAL_BASED_OUTPATIENT_CLINIC_OR_DEPARTMENT_OTHER)
Admission: RE | Admit: 2024-06-22 | Discharge: 2024-06-22 | Disposition: A | Discharge status: Home / Self Care | Source: Ambulatory Visit | Attending: Internal Medicine | Admitting: Internal Medicine

## 2024-06-22 DIAGNOSIS — R634 Abnormal weight loss: Secondary | ICD-10-CM | POA: Diagnosis not present

## 2024-06-22 DIAGNOSIS — R918 Other nonspecific abnormal finding of lung field: Secondary | ICD-10-CM | POA: Diagnosis not present

## 2024-06-22 DIAGNOSIS — J849 Interstitial pulmonary disease, unspecified: Secondary | ICD-10-CM | POA: Diagnosis not present

## 2024-06-22 DIAGNOSIS — C61 Malignant neoplasm of prostate: Secondary | ICD-10-CM | POA: Diagnosis not present

## 2024-06-22 DIAGNOSIS — K7689 Other specified diseases of liver: Secondary | ICD-10-CM | POA: Diagnosis not present

## 2024-06-22 MED ORDER — IOHEXOL 300 MG/ML  SOLN
100.0000 mL | Freq: Once | INTRAMUSCULAR | Status: AC | PRN
  Administered 2024-06-22: 100 mL via INTRAVENOUS

## 2024-06-27 ENCOUNTER — Ambulatory Visit: Payer: Self-pay | Admitting: Internal Medicine

## 2024-07-18 DIAGNOSIS — M6281 Muscle weakness (generalized): Secondary | ICD-10-CM | POA: Diagnosis not present

## 2024-07-18 DIAGNOSIS — R2689 Other abnormalities of gait and mobility: Secondary | ICD-10-CM | POA: Diagnosis not present

## 2024-07-20 DIAGNOSIS — R2689 Other abnormalities of gait and mobility: Secondary | ICD-10-CM | POA: Diagnosis not present

## 2024-07-20 DIAGNOSIS — M6281 Muscle weakness (generalized): Secondary | ICD-10-CM | POA: Diagnosis not present

## 2024-07-23 DIAGNOSIS — M6281 Muscle weakness (generalized): Secondary | ICD-10-CM | POA: Diagnosis not present

## 2024-07-23 DIAGNOSIS — R2689 Other abnormalities of gait and mobility: Secondary | ICD-10-CM | POA: Diagnosis not present

## 2024-07-25 DIAGNOSIS — R2689 Other abnormalities of gait and mobility: Secondary | ICD-10-CM | POA: Diagnosis not present

## 2024-07-25 DIAGNOSIS — M6281 Muscle weakness (generalized): Secondary | ICD-10-CM | POA: Diagnosis not present

## 2024-07-30 DIAGNOSIS — R2689 Other abnormalities of gait and mobility: Secondary | ICD-10-CM | POA: Diagnosis not present

## 2024-07-30 DIAGNOSIS — M6281 Muscle weakness (generalized): Secondary | ICD-10-CM | POA: Diagnosis not present

## 2024-08-01 DIAGNOSIS — Z23 Encounter for immunization: Secondary | ICD-10-CM | POA: Diagnosis not present

## 2024-08-03 DIAGNOSIS — R2689 Other abnormalities of gait and mobility: Secondary | ICD-10-CM | POA: Diagnosis not present

## 2024-08-03 DIAGNOSIS — M6281 Muscle weakness (generalized): Secondary | ICD-10-CM | POA: Diagnosis not present

## 2024-08-06 DIAGNOSIS — M6281 Muscle weakness (generalized): Secondary | ICD-10-CM | POA: Diagnosis not present

## 2024-08-06 DIAGNOSIS — R2689 Other abnormalities of gait and mobility: Secondary | ICD-10-CM | POA: Diagnosis not present

## 2024-08-08 DIAGNOSIS — R2689 Other abnormalities of gait and mobility: Secondary | ICD-10-CM | POA: Diagnosis not present

## 2024-08-08 DIAGNOSIS — M6281 Muscle weakness (generalized): Secondary | ICD-10-CM | POA: Diagnosis not present

## 2024-08-17 DIAGNOSIS — M6281 Muscle weakness (generalized): Secondary | ICD-10-CM | POA: Diagnosis not present

## 2024-08-17 DIAGNOSIS — R2689 Other abnormalities of gait and mobility: Secondary | ICD-10-CM | POA: Diagnosis not present

## 2024-08-20 DIAGNOSIS — R2689 Other abnormalities of gait and mobility: Secondary | ICD-10-CM | POA: Diagnosis not present

## 2024-08-20 DIAGNOSIS — M6281 Muscle weakness (generalized): Secondary | ICD-10-CM | POA: Diagnosis not present

## 2024-08-22 DIAGNOSIS — M6281 Muscle weakness (generalized): Secondary | ICD-10-CM | POA: Diagnosis not present

## 2024-08-22 DIAGNOSIS — R2689 Other abnormalities of gait and mobility: Secondary | ICD-10-CM | POA: Diagnosis not present

## 2024-08-30 DIAGNOSIS — R2689 Other abnormalities of gait and mobility: Secondary | ICD-10-CM | POA: Diagnosis not present

## 2024-08-30 DIAGNOSIS — M6281 Muscle weakness (generalized): Secondary | ICD-10-CM | POA: Diagnosis not present

## 2024-09-06 ENCOUNTER — Other Ambulatory Visit (HOSPITAL_COMMUNITY): Payer: Self-pay

## 2024-09-19 ENCOUNTER — Non-Acute Institutional Stay: Admitting: Internal Medicine

## 2024-09-19 ENCOUNTER — Encounter: Payer: Self-pay | Admitting: Internal Medicine

## 2024-09-19 VITALS — BP 118/68 | HR 79 | Temp 97.8°F | Resp 18 | Ht 68.0 in | Wt 140.0 lb

## 2024-09-19 DIAGNOSIS — K219 Gastro-esophageal reflux disease without esophagitis: Secondary | ICD-10-CM

## 2024-09-19 DIAGNOSIS — G20B2 Parkinson's disease with dyskinesia, with fluctuations: Secondary | ICD-10-CM | POA: Diagnosis not present

## 2024-09-19 DIAGNOSIS — E785 Hyperlipidemia, unspecified: Secondary | ICD-10-CM

## 2024-09-19 DIAGNOSIS — K5901 Slow transit constipation: Secondary | ICD-10-CM | POA: Diagnosis not present

## 2024-09-19 DIAGNOSIS — R634 Abnormal weight loss: Secondary | ICD-10-CM | POA: Diagnosis not present

## 2024-09-19 DIAGNOSIS — R4189 Other symptoms and signs involving cognitive functions and awareness: Secondary | ICD-10-CM

## 2024-09-19 DIAGNOSIS — F5101 Primary insomnia: Secondary | ICD-10-CM

## 2024-09-19 NOTE — Patient Instructions (Addendum)
 1) Senna Plus take 2 tablets at night  2) Take Miralax  in the morning  3) Labs will be done on November 05, 2024 at [facility] at 7:45 am  4) Your Medicare annual Wellness Visit will be at [employer] at [employer], [place], NP  on October 11, 2024

## 2024-09-19 NOTE — Progress Notes (Signed)
 [facility]  [facility]  Clinic (12)  Provider:   Code Status:  Goals of Care:     04/03/2024    9:08 AM  Advanced Directives  Does Patient Have a Medical Advance Directive? Yes  Type of Advance Directive Living will     Chief Complaint  Patient presents with   Constipation    Patient stated that he is unable to have bowel movement since last week and it has been uncomfortable.    HPI: Patient is a 83 y.o. male seen today for medical management of chronic diseases.    [place]ives [place] in [facility]  H/o Weight loss Parkinson disease Cognitive impairment and Constipation  Very Flat Effect  Discussed the use of AI scribe software for clinical note transcription with the patient, who gave verbal consent to proceed.  History of Present Illness   Johnny [name] is an 83 year old male with Parkinson's disease who presents with constipation and worsening Parkinson's symptoms.  He experiences constipation with bowel movements occurring once a week despite daily Miralax  use. Stool remains soft, but frequency has decreased from every three days. No nausea, vomiting, or blood in stool. Appetite has decreased. No other treatments attempted.  He feels slower with a mild, continuous headache and neck discomfort. Despite taking carbidopa /levodopa  (Sinemet ) two tablets three times a day, symptoms show no improvement.  He experiences nocturia, urinating twice per night, a change from his previous pattern. Denies urinary incontinence or daytime frequency issues. History of successful prostate surgery.  He notes memory issues, describing it as 'not great.' Continues to drive without incidents and manages finances with oversight from his son.      Past Medical History:  Diagnosis Date   Abnormal CXR 02/01/2017   See cxr 12/27/16 with min atx L HD elevation with no comparison studies    Acute metabolic encephalopathy 05/03/2022   AKI (acute kidney injury)  05/06/2022   Allergic rhinitis 12/18/2013   Pt has a follow up with Dr Asa. He was tested for [place] Pig allergy and it was normal.`E1o3L`IMPRESSION: Wants to try Grastek after viewing ad for same.  History of inadequately controlled allergy symptoms.   Ambulatory dysfunction 05/07/2022   Aortic atherosclerosis 05/04/2022   Benign neoplasm of colon 03/07/2013   Had 2 polyps 5 years ago. Needs repeat colonoscopy   Bilateral impacted cerumen 05/29/2018   Bilateral inguinal hernia (BIH) 10/31/2013   s/p lap repair w mesh 10/15/2013    Constipation 05/04/2022   Decubitus ulcer of left foot, stage 1 05/03/2022   Delirium 05/06/2022   Dysphagia, pharyngoesophageal phase 02/14/2014   Dysphonia 05/14/2014   Fall at home 05/03/2022   Gastroesophageal reflux disease without esophagitis 01/07/2020   He has a known history of this but feels the symptoms he is experiencing at night with excess acid is a fairly recent problem. 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  Dr. Renda, [facility] Formatting of this note might be different from the original. Overview: Overview: prostatectomy 2009 STORY: s/p bilateral nerve-sparing RLRP 01/01/2008.  Dr. 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  The goal is to have your total cholesterol < 200, the HDL (good cholesterol) >40, and the LDL (bad cholesterol) <100.  It is recomended that you follow a good low fat diet and exercise for 30 minutes 3-4 times a  week.Formatting of this note might be different from the original. Overview: IMPRE   Old tear of lateral meniscus of right knee 01/05/2019   No significant pain, gives way though. No previous PT, Would like to see ortho for recommendations   Organic impotence    Pain in joint of right knee 01/20/2021   Pain in joint of right shoulder 05/11/2022   Pain of left hip joint 05/13/2022   Pain of right hip joint 05/13/2022   Paresthesia 09/04/2020   Parkinson's disease (HCC)    Pulmonary nodules 05/04/2022   Seborrheic dermatitis 01/05/2019   Recent exacerbation, ketoconazole cream BID until resolution, then PRN.   SOB (shortness of breath) 11/06/2020   Soft tissue mass 06/09/2021   Traumatic hematoma of right shoulder 05/03/2022   Traumatic rhabdomyolysis 05/03/2022   Trigger ring finger of right hand 12/23/2014   Upper airway cough syndrome 02/01/2017   DgEs 01/29/14 ? Asp > neg ST eval  - Esophageal motility is within normal limits for age. No tertiary  contractions observed. Normal gastroesophageal junction. No  gastroesophageal reflux occurred or was elicited. Prompt emptying of  contrast in the duodenum.  Response to gabapentin  documented by Dr Brien / [facility] in 2016   - rechallenged with gabapentin  02/01/2017 > titrate up to 300 tid   Vitamin D deficiency 03/07/2013   Appropiate labs done today. We will send the results and adjust treatment as needed.   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   Weakness of left lower extremity 05/07/2022   Wears glasses     Past Surgical History:  Procedure Laterality Date   BRAVO PH STUDY N/A 05/06/2014   Procedure: BRAVO PH STUDY;  Surgeon: Princella CHRISTELLA Nida, MD;  Location: [facility];  Service: Endoscopy;  Laterality: N/A;   ESOPHAGOGASTRODUODENOSCOPY (EGD) WITH PROPOFOL  N/A 05/06/2014   Procedure: ESOPHAGOGASTRODUODENOSCOPY (EGD) WITH PROPOFOL ;  Surgeon: Princella CHRISTELLA Nida, MD;  Location: [facility];  Service: Endoscopy;  Laterality: N/A;   HYDROCELE  EXCISION Left 1994   HYDROCELE EXCISION Right 10/15/2013   Procedure: HYDROCELECTOMY ADULT;  Surgeon: Arlena LILLETTE Gal, MD;  Location: [facility];  Service: Urology;  Laterality: Right;  with excision of multiple epidyidmal cyst   HYDROCELE EXCISION Left 10/14/2014   Procedure: EPIDIDYMAL CYST EXCISION, LEFT;  Surgeon: Arlena LILLETTE Gal, MD;  Location: [facility];  Service: Urology;  Laterality: Left;   INGUINAL HERNIA REPAIR Bilateral 10/15/2013   Procedure:  LAPAROSCOPIC EXPLORATION  with REPAIR OF BILATERAL INGUINAL HERNIA;  Surgeon: Elspeth KYM Schultze, MD;  Location: [facility];  Service: General;  Laterality: Bilateral;   INSERTION OF MESH Bilateral 10/15/2013   Procedure: INSERTION OF MESH;  Surgeon: Elspeth KYM Schultze, MD;  Location: [facility];  Service: General;  Laterality: Bilateral;   PILONIDAL CYST EXCISION  1970'S   ROBOT ASSISTED LAPAROSCOPIC RADICAL PROSTATECTOMY  01-01-2008  DR RENDA   TENOTOMY ACHILLES TENDON Right 11-11-2005   TONSILLECTOMY     child    No Known Allergies  Outpatient Encounter Medications as of 09/19/2024  Medication Sig   aspirin  EC 81 MG tablet Take 1 tablet (81 mg total) by mouth daily. Swallow  whole.   atorvastatin  (LIPITOR) 20 MG tablet Take 1 tablet (20 mg total) by mouth daily.   carbidopa -levodopa  (SINEMET  IR) 25-100 MG tablet TAKE 2 TABLETS BY MOUTH THREE TIMES A DAY 7AM, 11AM & 4PM *BOTTLE*   Cholecalciferol (VITAMIN D3) 50 MCG (2000 UT) TABS Take 1 tablet by mouth daily.   folic acid  (FOLVITE ) 1 MG tablet Take 1 tablet (1 mg total) by mouth daily.   melatonin 5 MG TABS Take 1 tablet (5 mg total) by mouth every evening. For insomnia   mirtazapine  (REMERON ) 15 MG tablet Take 1 tablet (15 mg total) by mouth at bedtime.   Multiple Vitamin (MULTIVITAMIN WITH MINERALS) TABS tablet Take 1 tablet by mouth daily.   omeprazole  (PRILOSEC) 40 MG capsule Take 1 capsule (40 mg total) by mouth  daily.   polyethylene glycol (MIRALAX  / GLYCOLAX ) 17 g packet Take 17 g by mouth daily as needed.   Facility-Administered Encounter Medications as of 09/19/2024  Medication   bupivacaine  (MARCAINE ) 0.5 % (with pres) injection 50 mL    Review of Systems:  Review of Systems  Constitutional:  Positive for activity change. Negative for appetite change and unexpected weight change.  HENT: Negative.    Respiratory:  Negative for cough and shortness of breath.   Cardiovascular:  Negative for leg swelling.  Gastrointestinal:  Negative for constipation.  Genitourinary:  Positive for frequency.  Musculoskeletal:  Positive for gait problem and neck pain. Negative for arthralgias and myalgias.  Skin:  Negative for rash.  Neurological:  Negative for dizziness and weakness.  Psychiatric/Behavioral:  Positive for confusion. Negative for sleep disturbance.   All other systems reviewed and are negative.   Health Maintenance  Topic Date Due   DTaP/Tdap/Td (1 - Tdap) Never done   Zoster Vaccines- Shingrix (1 of 2) Never done   Medicare Annual Wellness (AWV)  01/11/2023   Influenza Vaccine  07/06/2024   COVID-19 Vaccine (7 - 2025-26 season) 08/06/2024   Pneumococcal Vaccine: 50+ Years  Completed   Meningococcal B Vaccine  Aged Out    Physical Exam: Vitals:   09/19/24 0816  BP: 118/68  Pulse: 79  Resp: 18  Temp: 97.8 F (36.6 C)  SpO2: 97%  Weight: 140 lb (63.5 kg)  Height: 5' 8 (1.727 m)   Body mass index is 21.29 kg/m. Physical Exam Vitals reviewed.  Constitutional:      Appearance: Normal appearance.  HENT:     Head: Normocephalic.     Nose: Nose normal.     Mouth/Throat:     Mouth: Mucous membranes are moist.     Pharynx: Oropharynx is clear.  Eyes:     Pupils: Pupils are equal, round, and reactive to light.  Cardiovascular:     Rate and Rhythm: Normal rate and regular rhythm.     Pulses: Normal pulses.     Heart sounds: No murmur heard. Pulmonary:     Effort:  Pulmonary effort is normal. No respiratory distress.     Breath sounds: Normal breath sounds. No rales.  Abdominal:     General: Abdomen is flat. Bowel sounds are normal.     Palpations: Abdomen is soft.  Musculoskeletal:        General: No swelling.     Cervical back: Neck supple.  Skin:    General: Skin is warm.  Neurological:     Mental Status: He is alert.     Comments: Very Flat Effect   Psychiatric:  Mood and Affect: Mood normal.        Thought Content: Thought content normal.     Labs reviewed: Basic Metabolic Panel: Recent Labs    11/17/23 0858 05/17/24 0800  NA 138 139  K 4.1 4.4  CL 103 103  CO2 31 27  GLUCOSE 94 97  BUN 21 27*  CREATININE 1.00 1.06  CALCIUM  9.4 9.4  TSH 3.63 2.63   Liver Function Tests: Recent Labs    11/17/23 0858 05/17/24 0800  AST 13 12  ALT <3* <3*  BILITOT 0.6 0.4  PROT 6.3 6.2   No results for input(s): LIPASE, AMYLASE in the last 8760 hours. No results for input(s): AMMONIA in the last 8760 hours. CBC: Recent Labs    11/17/23 0858 05/17/24 0800  WBC 5.4 4.4  NEUTROABS 3,861 2,763  HGB 12.6* 11.3*  HCT 38.0* 35.7*  MCV 97.9 99.2  PLT 179 176   Lipid Panel: Recent Labs    11/17/23 0858 05/17/24 0800  CHOL 147 138  HDL 51 57  LDLCALC 78 67  TRIG 96 48  CHOLHDL 2.9 2.4   No results found for: HGBA1C  Procedures since last visit: No results found.  Assessment/Plan Assessment and Plan    Constipation Chronic constipation with exacerbation, bowel movements once weekly despite daily Miralax . CT confirmed severe constipation without malignancy. Reduced appetite may contribute. - Add Senna Plus at night with Miralax  in the morning. - Advise two Senna Plus tablets at night and Miralax  in the morning. - Consider Linzess if no improvement. - Reassess in four weeks.  Nocturia Increased nocturnal urination frequency, two trips per night. No incontinence or daytime frequency.S/P Prostatectomy   Constipation may exacerbate nocturia. - Monitor nocturia, consider medication if frequency increases to three or four times per night. - Address constipation to potentially alleviate nocturia.  Parkinson's disease Symptoms include mild headache, neck discomfort, slowed movements. Current regimen: carbidopa /levodopa  (Sinemet ) two tablets three times daily. Symptoms persist. Scheduled to see neurologist Dr. Evonnie in November.  - Ensure adherence to current medication schedule: two tablets of Sinemet  three times daily.  Weight Loss  CT was negative Ot sure if he is taking Remeron  He was not sure Wt Readings from Last 3 Encounters:  09/19/24 140 lb (63.5 kg)  05/23/24 136 lb 9.6 oz (62 kg)  04/03/24 136 lb (61.7 kg)    General Health Maintenance Received flu vaccination. Declined COVID-19 vaccination due to personal beliefs. Discussed herd immunity benefits. - Document flu vaccination. - Respect decision to decline COVID-19 vaccination.  Pulmonary nodules They have been stable Follows with Dr Nathanael in [place] GERD Continue PPI Per pulmonary      Cognitive impairment He is managing his Meds and Finances D/w Dr Tat also he would benefit by Aricpet or Exelon He refused before  HLD On statin Labs/tests ordered:  * No order type specified * Next appt:  09/26/2024

## 2024-09-21 ENCOUNTER — Other Ambulatory Visit: Payer: Self-pay | Admitting: Internal Medicine

## 2024-09-22 ENCOUNTER — Other Ambulatory Visit: Payer: Self-pay | Admitting: Neurology

## 2024-09-22 DIAGNOSIS — G20A1 Parkinson's disease without dyskinesia, without mention of fluctuations: Secondary | ICD-10-CM

## 2024-09-26 ENCOUNTER — Encounter: Admitting: Internal Medicine

## 2024-10-03 ENCOUNTER — Other Ambulatory Visit: Payer: Self-pay

## 2024-10-03 DIAGNOSIS — G20A1 Parkinson's disease without dyskinesia, without mention of fluctuations: Secondary | ICD-10-CM

## 2024-10-03 MED ORDER — CARBIDOPA-LEVODOPA 25-100 MG PO TABS
ORAL_TABLET | ORAL | 0 refills | Status: AC
Start: 2024-10-03 — End: ?

## 2024-10-03 MED ORDER — ATORVASTATIN CALCIUM 20 MG PO TABS: 20.0000 mg | ORAL_TABLET | Freq: Every day | ORAL | 1 refills | Status: AC

## 2024-10-03 MED ORDER — OMEPRAZOLE 40 MG PO CPDR
40.0000 mg | DELAYED_RELEASE_CAPSULE | Freq: Every day | ORAL | 3 refills | Status: AC
Start: 2024-10-03 — End: ?

## 2024-10-03 NOTE — Telephone Encounter (Signed)
 Pt wouldl like medication filled through [facility]

## 2024-10-11 ENCOUNTER — Encounter: Payer: Self-pay | Admitting: Nurse Practitioner

## 2024-10-11 NOTE — Progress Notes (Unsigned)
 [name]

## 2024-10-12 NOTE — Progress Notes (Signed)
 This encounter was created in error - please disregard.

## 2024-10-12 NOTE — Progress Notes (Signed)
 Assessment/Plan:    Parkinson's disease, diagnosed June, 2023 but suspect that patient has had disease for quite some time prior to that -Continue carbidopa /levodopa  25/100, 2/2/2.    2. Sialorrhea             -This is commonly associated with PD.  We talked about treatments.  The patient is not a candidate for oral anticholinergic therapy because of increased risk of confusion and falls.  We can discuss botox in future if more problematic for patient.  He denies he even has it but clearly does on examination   3. Myoclonus/mini-myoclonus and fasiculations             -resolved after started levodopa   -tried to order MRI cervical spine but pt didn't respond when they tried to contact him and decided to hold after that since symptoms got better.             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  Pt asymptommatic in all those regards.   4.  Memory change             -Patient with neurocognitive testing in December, 2023. 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  -discussed again no driving.  Dr. Richie also supported this recommendation in December, 2023.  - He had I discussed repeating neurocognitive testing versus just adding donepezil now.  We could also do both of these things.  He declines both  5.  Dizziness  pcp is monitoring. Pt states that this is accompanied by this.  Suspect that this is a lack of hydration issue  -increase water  intake and can liberalize salt intake a bit  6.  Weight loss  -has gained weight since last visit  -he reports appetite decrease but also blames on quality of food  - We previously sent a prescription for mirtazapine , but he only took it for a few days and stopped.  He has gained weight since last  visit so decided to hold restarting it for now  - Primary care has been investigating other sources and thus far has been negative  7.  Nocturia  -reports that this is new for him so told him to make sure that pcp is aware  Subjective:   Johnny [name] was seen today in follow up for Parkinsons disease, diagnosed last visit.  My previous records were reviewed prior to todays visit as well as outside records available to me.  We added mirtazapine  last visit for weight loss and patient had wanted that prescription sent to [facility].  Records from primary care indicate that he took this medication for a few days and stopped it.  His primary care did order CT chest, abdomen and pelvis and that was unremarkable.  No falls.  No near syncope.  No hallucinations.  He is walking for exercise.  Sleeping well.  No cramping at night.  Noting nocturia which is new for him  Current prescribed movement disorder medications: Carbidopa /levodopa  25/100, 2 tablets 3 times per day  Mirtazapine , 15 mg    ALLERGIES:  No Known Allergies  CURRENT MEDICATIONS:  Current Meds  Medication Sig   aspirin  EC 81 MG tablet Take 1 tablet (81 mg total) by mouth daily. Swallow whole.   atorvastatin  (LIPITOR) 20  MG tablet Take 1 tablet (20 mg total) by mouth daily.   carbidopa -levodopa  (SINEMET  IR) 25-100 MG tablet TAKE 2 TABLETS BY MOUTH THREE TIMES A DAY 7AM, 11AM & 4PM *BOTTLE*   Cholecalciferol (VITAMIN D3) 50 MCG (2000 UT) TABS Take 1 tablet by mouth daily.   folic acid  (FOLVITE ) 1 MG tablet Take 1 tablet (1 mg total) by mouth daily.   melatonin 5 MG TABS Take 1 tablet (5 mg total) by mouth every evening. For insomnia   mirtazapine  (REMERON ) 15 MG tablet Take 1 tablet (15 mg total) by mouth at bedtime.   Multiple Vitamin (MULTIVITAMIN WITH MINERALS) TABS tablet Take 1 tablet by mouth daily.   omeprazole  (PRILOSEC) 40 MG capsule Take 1 capsule (40 mg total) by mouth daily.   polyethylene glycol (MIRALAX  / GLYCOLAX )  17 g packet Take 17 g by mouth daily as needed.     Objective:   PHYSICAL EXAMINATION:    VITALS:   Vitals:   10/16/24 0912  BP: 128/82  Pulse: 68  SpO2: 98%  Weight: 144 lb 6.4 oz (65.5 kg)   Wt Readings from Last 3 Encounters:  10/16/24 144 lb 6.4 oz (65.5 kg)  09/19/24 140 lb (63.5 kg)  05/23/24 136 lb 9.6 oz (62 kg)    GEN:  The patient appears stated age and is in NAD. HEENT:  Normocephalic, atraumatic.  The mucous membranes are moist. The superficial temporal arteries are without ropiness or tenderness. CV:  RRR Lungs CTAB   Neurological examination:  Orientation: The patient is alert and oriented x3.  He is slow to answer but generally accurate Cranial nerves: There is good facial symmetry with facial hypomimia. The speech is fluent and clear. Soft palate rises symmetrically and there is no tongue deviation. Hearing is intact to conversational tone. Sensation: Sensation is intact to light touch throughout Motor: Strength is at least antigravity x4.  Movement examination: Tone: There is mild to mod increased tone in the RUE (some gegenhalten) Abnormal movements: no tremor today.  No axial myoclonus.  No mini myoclonus.   Coordination:  There is mild decremation with RAM's, with any form of RAMS, including alternating supination and pronation of the forearm, hand opening and closing, finger taps, heel taps and toe taps R>L and UE>LE Gait and Station: The patient pushes off of the chair to arise.  Needs no assistance.  Has no assistive device today.  He is not shuffling.  He is flexed at waist.  He is short stepped.  This is same stable  I have reviewed and interpreted the following labs independently    Chemistry      Component Value Date/Time   NA 139 05/17/2024 0800   NA 139 05/18/2022 0000   K 4.4 05/17/2024 0800   CL 103 05/17/2024 0800   CO2 27 05/17/2024 0800   BUN 27 (H) 05/17/2024 0800   BUN 19 05/18/2022 0000   CREATININE 1.06 05/17/2024 0800   GLU  90 05/18/2022 0000      Component Value Date/Time   CALCIUM  9.4 05/17/2024 0800   ALKPHOS 56 05/10/2022 0000   AST 12 05/17/2024 0800   ALT <3 (L) 05/17/2024 0800   BILITOT 0.4 05/17/2024 0800       Lab Results  Component Value Date   WBC 4.4 05/17/2024   HGB 11.3 (L) 05/17/2024   HCT 35.7 (L) 05/17/2024   MCV 99.2 05/17/2024   PLT 176 05/17/2024    Lab Results  Component Value Date  TSH 2.63 05/17/2024    Total time spent on today's visit was 30 minutes, including both face-to-face time and nonface-to-face time.  Time included that spent on review of records (prior notes available to me/labs/imaging if pertinent), discussing treatment and goals, answering patient's questions and coordinating care.  Cc:  Charlanne Fredia CROME, MD

## 2024-10-16 ENCOUNTER — Ambulatory Visit (INDEPENDENT_AMBULATORY_CARE_PROVIDER_SITE_OTHER): Admitting: Neurology

## 2024-10-16 ENCOUNTER — Encounter: Payer: Self-pay | Admitting: Neurology

## 2024-10-16 VITALS — BP 128/82 | HR 68 | Wt 144.4 lb

## 2024-10-16 DIAGNOSIS — G20A1 Parkinson's disease without dyskinesia, without mention of fluctuations: Secondary | ICD-10-CM | POA: Diagnosis not present

## 2024-10-16 DIAGNOSIS — R351 Nocturia: Secondary | ICD-10-CM

## 2024-10-16 DIAGNOSIS — R634 Abnormal weight loss: Secondary | ICD-10-CM | POA: Diagnosis not present

## 2024-10-16 NOTE — Patient Instructions (Signed)
 Good to see you!  Increase your water  intake.  Also talk to your primary care about your recent increase in urination at night  The physicians and staff at [facility] are committed to providing excellent care. You may receive a survey requesting feedback about your experience at our office. We strive to receive very good responses to the survey questions. If you feel that your experience would prevent you from giving the office a very good  response, please contact our office to try to remedy the situation. We may be reached at [phone]. Thank you for taking the time out of your busy day to complete the survey.

## 2024-11-06 NOTE — Patient Instructions (Signed)
 1) Please visit your local pharmacy to receive your covid and tetanus vaccine, if you have not already received.    2) Labs will be done on March 04, 2025 at [facility] at 7:45 am   3) Medicare annual wellness visit will be scheduled with Mast, Man X, NP  at [facility] on January 03, 2025 at 2:20pm

## 2024-11-07 ENCOUNTER — Non-Acute Institutional Stay: Payer: Self-pay | Admitting: Internal Medicine

## 2024-11-07 ENCOUNTER — Encounter: Payer: Self-pay | Admitting: Internal Medicine

## 2024-11-07 VITALS — BP 120/76 | HR 73 | Temp 97.0°F | Resp 18 | Ht 68.0 in | Wt 143.1 lb

## 2024-11-07 DIAGNOSIS — E785 Hyperlipidemia, unspecified: Secondary | ICD-10-CM | POA: Diagnosis not present

## 2024-11-07 DIAGNOSIS — J3089 Other allergic rhinitis: Secondary | ICD-10-CM | POA: Diagnosis not present

## 2024-11-07 DIAGNOSIS — R634 Abnormal weight loss: Secondary | ICD-10-CM | POA: Diagnosis not present

## 2024-11-07 DIAGNOSIS — G20A1 Parkinson's disease without dyskinesia, without mention of fluctuations: Secondary | ICD-10-CM

## 2024-11-07 DIAGNOSIS — K5901 Slow transit constipation: Secondary | ICD-10-CM | POA: Diagnosis not present

## 2024-11-07 DIAGNOSIS — R4189 Other symptoms and signs involving cognitive functions and awareness: Secondary | ICD-10-CM

## 2024-11-07 DIAGNOSIS — R918 Other nonspecific abnormal finding of lung field: Secondary | ICD-10-CM

## 2024-11-07 DIAGNOSIS — K219 Gastro-esophageal reflux disease without esophagitis: Secondary | ICD-10-CM

## 2024-11-07 NOTE — Progress Notes (Signed)
 Location: [facility] of Service:  Clinic (12)  Provider:   Code Status:  Goals of Care:     11/07/2024    8:33 AM  Advanced Directives  Does Patient Have a Medical Advance Directive? Yes  Type of Advance Directive Out of facility DNR (pink MOST or yellow form)  Does patient want to make changes to medical advance directive? No - Patient declined  Pre-existing out of facility DNR order (yellow form or pink MOST form) Yellow form placed in chart (order not valid for inpatient use)     Chief Complaint  Patient presents with   Medical Management of Chronic Issues     4 months follow-up with labs, needs to discuss, medicare annual wellness visit, covid and tetanus vaccine     HPI: Patient is a 83 y.o. male seen today for an acute visit for Routine visit   Discussed the use of AI scribe software for clinical note transcription with the patient, who gave verbal consent to proceed.  History of Present Illness   Johnny [name] is an 83 year old male with Parkinson's disease who presents with a runny nose and increased nighttime urination.  He has had a runny nose for the past couple of weeks without cough or mucus production and is unsure if this is concerning.  He has increased nighttime urination and cannot sleep through the night without getting up to void, while daytime urination is better. He has no dysuria.  He has constipation that is controlled with Miralax . He is not using other laxatives.  He has typical Parkinson's symptoms and a very sore, painful neck. He takes Tylenol   for neck pain with relief.  He is not taking vitamin D, melatonin, Remeron  for appetite, memory medication, or a multivitamin.  He exercises regularly and plans to increase activity. He manages his apartment and daily activities independently.  His son, who lives near [place] , [place] is his power of attorney.       Past Medical History:  Diagnosis Date   Abnormal CXR  02/01/2017   See cxr 12/27/16 with min atx L HD elevation with no comparison studies    Acute metabolic encephalopathy 05/03/2022   AKI (acute kidney injury) 05/06/2022   Allergic rhinitis 12/18/2013   Pt has a follow up with Dr Asa. He was tested for guinea Pig allergy and it was normal.`[id number]`IMPRESSION: Wants to try [employer] after viewing ad for same.  History of inadequately controlled allergy symptoms.   Ambulatory dysfunction 05/07/2022   Aortic atherosclerosis 05/04/2022   Benign neoplasm of colon 03/07/2013   Had 2 polyps 5 years ago. Needs repeat colonoscopy   Bilateral impacted cerumen 05/29/2018   Bilateral inguinal hernia (BIH) 10/31/2013   s/p lap repair w mesh 10/15/2013    Constipation 05/04/2022   Decubitus ulcer of left foot, stage 1 05/03/2022   Delirium 05/06/2022   Dysphagia, pharyngoesophageal phase 02/14/2014   Dysphonia 05/14/2014   Fall at home 05/03/2022   Gastroesophageal reflux disease without esophagitis 01/07/2020   He has a known history of this but feels the symptoms he is experiencing at night with excess acid is a fairly recent problem. 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  Dr. Renda, [facility] Formatting of this note might be different from the original. Overview: Overview: prostatectomy 2009 STORY: s/p bilateral nerve-sparing RLRP 01/01/2008.  Dr. 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  The goal is to have your total cholesterol < 200, the HDL  (good cholesterol) >40, and the LDL (bad cholesterol) <100.  It is recomended that you follow a good low fat diet and exercise for 30 minutes 3-4 times a week.Formatting of this note might be different from the original. Overview: IMPRE   Old tear of lateral meniscus of right knee 01/05/2019   No significant pain, gives way though. No previous PT, Would like to see ortho for recommendations   Organic impotence    Pain in joint of right knee 01/20/2021   Pain in joint of right shoulder 05/11/2022   Pain of left hip joint 05/13/2022   Pain of right hip joint 05/13/2022   Paresthesia 09/04/2020   Parkinson's disease (HCC)    Pulmonary nodules 05/04/2022   Seborrheic dermatitis 01/05/2019   Recent exacerbation, ketoconazole cream BID until resolution, then PRN.   SOB (shortness of breath) 11/06/2020   Soft tissue mass 06/09/2021   Traumatic hematoma of right shoulder 05/03/2022   Traumatic rhabdomyolysis 05/03/2022   Trigger ring finger of right hand 12/23/2014   Upper airway cough syndrome 02/01/2017   DgEs 01/29/14 ? Asp > neg ST eval  - Esophageal motility is within normal limits for age. No tertiary  contractions observed. Normal gastroesophageal junction. No  gastroesophageal reflux occurred or was elicited. Prompt emptying of  contrast in the duodenum.  Response to gabapentin  documented by Dr Brien / [facility] in 2016   - rechallenged with gabapentin  02/01/2017 > titrate up to 300 tid   Vitamin D deficiency 03/07/2013   Appropiate labs done today. We will send the results and adjust treatment as needed.   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   Weakness of left lower extremity 05/07/2022   Wears glasses     Past Surgical History:  Procedure Laterality Date   BRAVO PH STUDY N/A 05/06/2014   Procedure: BRAVO PH STUDY;  Surgeon: Princella CHRISTELLA Nida, MD;  Location: [facility];  Service: Endoscopy;  Laterality: N/A;   ESOPHAGOGASTRODUODENOSCOPY (EGD) WITH PROPOFOL  N/A 05/06/2014   Procedure:  ESOPHAGOGASTRODUODENOSCOPY (EGD) WITH PROPOFOL ;  Surgeon: Princella CHRISTELLA Nida, MD;  Location: [facility];  Service: Endoscopy;  Laterality: N/A;   HYDROCELE EXCISION Left 1994   HYDROCELE EXCISION Right 10/15/2013   Procedure: HYDROCELECTOMY ADULT;  Surgeon: Arlena LILLETTE Gal, MD;  Location: [facility];  Service: Urology;  Laterality: Right;  with excision of multiple epidyidmal cyst   HYDROCELE EXCISION Left 10/14/2014   Procedure: EPIDIDYMAL CYST EXCISION, LEFT;  Surgeon: Arlena LILLETTE Gal, MD;  [facility];  Service: Urology;  Laterality: Left;   INGUINAL HERNIA REPAIR Bilateral 10/15/2013   Procedure:  LAPAROSCOPIC EXPLORATION  with REPAIR OF BILATERAL INGUINAL HERNIA;  Surgeon: Elspeth KYM Schultze, MD;  Location: [facility] [place];  Service: General;  Laterality: Bilateral;   INSERTION OF MESH Bilateral 10/15/2013   Procedure: INSERTION OF MESH;  Surgeon: Elspeth KYM Schultze, MD;  Location: [facility];  Service: General;  Laterality: Bilateral;   PILONIDAL CYST EXCISION  1970'S   ROBOT ASSISTED LAPAROSCOPIC RADICAL PROSTATECTOMY  01-01-2008  DR RENDA   TENOTOMY ACHILLES TENDON Right 11-11-2005   TONSILLECTOMY     child    No Known Allergies  Outpatient Encounter Medications as of 11/07/2024  Medication Sig   aspirin  EC 81 MG tablet Take 1 tablet (81 mg total) by mouth daily. Swallow whole.   atorvastatin  (LIPITOR) 20 MG tablet Take 1 tablet (20 mg total) by mouth daily.   carbidopa -levodopa  (SINEMET  IR) 25-100 MG tablet TAKE 2 TABLETS BY MOUTH THREE TIMES A DAY 7AM, 11AM & 4PM *BOTTLE*   Cholecalciferol (VITAMIN D3) 50 MCG (2000 UT) TABS Take 1 tablet by mouth daily.   omeprazole  (PRILOSEC) 40 MG capsule Take 1 capsule (40 mg total) by mouth daily.   polyethylene glycol (MIRALAX  / GLYCOLAX ) 17 g packet Take 17 g by mouth daily as needed.   [DISCONTINUED] folic acid  (FOLVITE ) 1 MG tablet Take 1 tablet (1 mg total) by mouth  daily. (Patient not taking: Reported on 11/07/2024)   [DISCONTINUED] melatonin 5 MG TABS Take 1 tablet (5 mg total) by mouth every evening. For insomnia (Patient not taking: Reported on 11/07/2024)   [DISCONTINUED] mirtazapine  (REMERON ) 15 MG tablet Take 1 tablet (15 mg total) by mouth at bedtime. (Patient not taking: Reported on 11/07/2024)   [DISCONTINUED] Multiple Vitamin (MULTIVITAMIN WITH MINERALS) TABS tablet Take 1 tablet by mouth daily. (Patient not taking: Reported on 11/07/2024)   Facility-Administered Encounter Medications as of 11/07/2024  Medication   bupivacaine  (MARCAINE ) 0.5 % (with pres) injection 50 mL    Review of Systems:  Review of Systems  Constitutional:  Negative for activity change, appetite change and unexpected weight change.  HENT:  Positive for postnasal drip and rhinorrhea.   Respiratory:  Negative for cough and shortness of breath.   Cardiovascular:  Negative for leg swelling.  Gastrointestinal:  Negative for constipation.  Genitourinary:  Negative for frequency.  Musculoskeletal:  Positive for neck pain and neck stiffness. Negative for arthralgias, gait problem and myalgias.  Skin: Negative.  Negative for rash.  Neurological:  Negative for dizziness and weakness.  Psychiatric/Behavioral:  Negative for confusion and sleep disturbance.   All other systems reviewed and are negative.   Health Maintenance  Topic Date Due   DTaP/Tdap/Td (1 - Tdap) Never done   Medicare Annual Wellness (AWV)  01/11/2023   COVID-19 Vaccine (7 - 2025-26 season) 08/06/2024   Pneumococcal Vaccine: 50+ Years  Completed   Influenza Vaccine  Completed   Zoster Vaccines- Shingrix  Completed   Meningococcal B Vaccine  Aged Out    Physical Exam: Vitals:   11/07/24 0824  BP: 120/76  Pulse: 73  Resp: 18  Temp: (!) 97 F (36.1 C)  SpO2: 97%  Weight: 143 lb 1.6 oz (64.9 kg)  Height: 5' 8 (1.727 m)   Body mass index is 21.76 kg/m. Physical Exam Vitals reviewed.   Constitutional:      Appearance: Normal appearance.  HENT:     Head: Normocephalic.     Nose: Nose normal.     Mouth/Throat:     Mouth: Mucous membranes are moist.     Pharynx: Oropharynx is clear.  Eyes:     Pupils: Pupils are equal, round, and reactive to light.  Cardiovascular:     Rate and Rhythm: Normal rate and regular rhythm.     Pulses: Normal pulses.     Heart sounds: No murmur heard. Pulmonary:     Effort: Pulmonary effort is normal. No respiratory distress.     Breath sounds: Normal breath  sounds. No rales.  Abdominal:     General: Abdomen is flat. Bowel sounds are normal.     Palpations: Abdomen is soft.  Musculoskeletal:        General: No swelling.     Cervical back: Neck supple.  Skin:    General: Skin is warm.  Neurological:     General: No focal deficit present.     Mental Status: He is alert.  Psychiatric:        Mood and Affect: Mood normal.        Thought Content: Thought content normal.     Labs reviewed: Basic Metabolic Panel: Recent Labs    11/17/23 0858 05/17/24 0800  NA 138 139  K 4.1 4.4  CL 103 103  CO2 31 27  GLUCOSE 94 97  BUN 21 27*  CREATININE 1.00 1.06  CALCIUM  9.4 9.4  TSH 3.63 2.63   Liver Function Tests: Recent Labs    11/17/23 0858 05/17/24 0800  AST 13 12  ALT <3* <3*  BILITOT 0.6 0.4  PROT 6.3 6.2   No results for input(s): LIPASE, AMYLASE in the last 8760 hours. No results for input(s): AMMONIA in the last 8760 hours. CBC: Recent Labs    11/17/23 0858 05/17/24 0800  WBC 5.4 4.4  NEUTROABS 3,861 2,763  HGB 12.6* 11.3*  HCT 38.0* 35.7*  MCV 97.9 99.2  PLT 179 176   Lipid Panel: Recent Labs    11/17/23 0858 05/17/24 0800  CHOL 147 138  HDL 51 57  LDLCALC 78 67  TRIG 96 48  CHOLHDL 2.9 2.4   No results found for: HGBA1C  Procedures since last visit: No results found.  Assessment/Plan Assessment and Plan    Parkinson's disease  Symptoms well-managed with carbidopa . Encouraged  exercise to control symptoms. - Continue carbidopa  as prescribed. - Encouraged regular exercise.  Constipation Managed effectively with Miralax .  Cognitive impairment Declined Aricept and Namenda or Exelon Still managing his ADLS and IADLS Present, declines medication. Engages in brain-stimulating activities. - Encouraged cognitive activities such as puzzles and reading.  Nocturia likely due to benign prostatic hyperplasia Increased nocturia likely due to benign prostatic hyperplasia.  Refused FLomax - Will consider urology referral if symptoms persist.  Allergic rhinitis Runny nose likely due to allergic rhinitis. No significant cough or mucus production. - Recommended over-the-counter cetirizine. - Recommended Flonase nasal spray.  Musculoskeletal neck pain Present, possibly related to Parkinson's disease. Managed with Tylenol . - Continue Tylenol  as needed.  General Health Maintenance Due for blood work. Vitamin D supplementation recommended. - Will schedule blood work before next appointment. - Continue vitamin D supplementation.    HLD stable on Statin  Weight loss CT was negative Stabilized now Did not take Remeron  as it made him feel Different  Pulmonary nodules They have been stable Follows with Dr Nathanael in [place] [facility] Continue PPI Per pulmonary      Labs/tests ordered:  * No order type specified * Next appt:  Visit date not found

## 2024-12-14 ENCOUNTER — Encounter: Payer: Self-pay | Admitting: Neurology

## 2025-01-03 ENCOUNTER — Encounter: Admitting: Nurse Practitioner

## 2025-01-04 NOTE — Progress Notes (Unsigned)
 This encounter was created in error - please disregard.  This encounter was created in error - please disregard.

## 2025-03-06 ENCOUNTER — Encounter: Admitting: Internal Medicine

## 2025-04-18 ENCOUNTER — Ambulatory Visit: Admitting: Neurology

## 2025-05-02 ENCOUNTER — Ambulatory Visit: Admitting: Neurology

## 2025-05-02 ENCOUNTER — Ambulatory Visit: Payer: Self-pay | Admitting: Neurology
